# Patient Record
Sex: Female | Born: 1975
Health system: Southern US, Community
[De-identification: ages and names within clinical notes are randomized; demographics above are authoritative.]

## PROBLEM LIST (undated history)

## (undated) DIAGNOSIS — F419 Anxiety disorder, unspecified: Secondary | ICD-10-CM

## (undated) DIAGNOSIS — N920 Excessive and frequent menstruation with regular cycle: Secondary | ICD-10-CM

## (undated) DIAGNOSIS — R1909 Other intra-abdominal and pelvic swelling, mass and lump: Secondary | ICD-10-CM

## (undated) DIAGNOSIS — F32A Depression, unspecified: Secondary | ICD-10-CM

## (undated) DIAGNOSIS — D5 Iron 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): Secondary | ICD-10-CM

## (undated) DIAGNOSIS — I10 Essential (primary) hypertension: Secondary | ICD-10-CM

## (undated) DIAGNOSIS — M1990 Unspecified osteoarthritis, unspecified site: Secondary | ICD-10-CM

## (undated) DIAGNOSIS — E782 Mixed hyperlipidemia: Secondary | ICD-10-CM

## (undated) DIAGNOSIS — D649 Anemia, unspecified: Secondary | ICD-10-CM

## (undated) DIAGNOSIS — Z9289 Personal history of other medical treatment: Secondary | ICD-10-CM

---

## 2000-01-17 ENCOUNTER — Encounter: Admission: RE | Admit: 2000-01-17 | Discharge: 2000-01-17 | Payer: Self-pay | Admitting: Hematology and Oncology

## 2000-01-22 ENCOUNTER — Encounter: Admission: RE | Admit: 2000-01-22 | Discharge: 2000-01-22 | Payer: Self-pay | Admitting: Hematology and Oncology

## 2002-11-03 ENCOUNTER — Emergency Department (HOSPITAL_COMMUNITY): Admission: EM | Admit: 2002-11-03 | Discharge: 2002-11-03 | Payer: Self-pay

## 2011-10-24 DIAGNOSIS — E669 Obesity, unspecified: Secondary | ICD-10-CM | POA: Diagnosis not present

## 2011-10-24 DIAGNOSIS — Z309 Encounter for contraceptive management, unspecified: Secondary | ICD-10-CM | POA: Diagnosis not present

## 2011-10-24 DIAGNOSIS — I10 Essential (primary) hypertension: Secondary | ICD-10-CM | POA: Diagnosis not present

## 2011-10-24 DIAGNOSIS — F329 Major depressive disorder, single episode, unspecified: Secondary | ICD-10-CM | POA: Diagnosis not present

## 2012-02-05 DIAGNOSIS — I10 Essential (primary) hypertension: Secondary | ICD-10-CM | POA: Diagnosis not present

## 2012-02-05 DIAGNOSIS — F329 Major depressive disorder, single episode, unspecified: Secondary | ICD-10-CM | POA: Diagnosis not present

## 2013-02-11 DIAGNOSIS — Z13220 Encounter for screening for lipoid disorders: Secondary | ICD-10-CM | POA: Diagnosis not present

## 2013-02-11 DIAGNOSIS — Z309 Encounter for contraceptive management, unspecified: Secondary | ICD-10-CM | POA: Diagnosis not present

## 2013-02-11 DIAGNOSIS — I10 Essential (primary) hypertension: Secondary | ICD-10-CM | POA: Diagnosis not present

## 2013-02-11 DIAGNOSIS — Z131 Encounter for screening for diabetes mellitus: Secondary | ICD-10-CM | POA: Diagnosis not present

## 2013-06-22 DIAGNOSIS — I10 Essential (primary) hypertension: Secondary | ICD-10-CM | POA: Diagnosis not present

## 2013-06-22 DIAGNOSIS — Z23 Encounter for immunization: Secondary | ICD-10-CM | POA: Diagnosis not present

## 2013-06-22 DIAGNOSIS — Z309 Encounter for contraceptive management, unspecified: Secondary | ICD-10-CM | POA: Diagnosis not present

## 2013-06-22 DIAGNOSIS — F329 Major depressive disorder, single episode, unspecified: Secondary | ICD-10-CM | POA: Diagnosis not present

## 2014-06-22 DIAGNOSIS — Z1231 Encounter for screening mammogram for malignant neoplasm of breast: Secondary | ICD-10-CM | POA: Diagnosis not present

## 2014-06-22 DIAGNOSIS — Z13220 Encounter for screening for lipoid disorders: Secondary | ICD-10-CM | POA: Diagnosis not present

## 2014-06-22 DIAGNOSIS — Z131 Encounter for screening for diabetes mellitus: Secondary | ICD-10-CM | POA: Diagnosis not present

## 2014-06-22 DIAGNOSIS — I10 Essential (primary) hypertension: Secondary | ICD-10-CM | POA: Diagnosis not present

## 2014-06-22 DIAGNOSIS — F321 Major depressive disorder, single episode, moderate: Secondary | ICD-10-CM | POA: Diagnosis not present

## 2014-09-21 DIAGNOSIS — Z23 Encounter for immunization: Secondary | ICD-10-CM | POA: Diagnosis not present

## 2014-09-21 DIAGNOSIS — F321 Major depressive disorder, single episode, moderate: Secondary | ICD-10-CM | POA: Diagnosis not present

## 2014-09-21 DIAGNOSIS — Z111 Encounter for screening for respiratory tuberculosis: Secondary | ICD-10-CM | POA: Diagnosis not present

## 2014-09-21 DIAGNOSIS — Z1239 Encounter for other screening for malignant neoplasm of breast: Secondary | ICD-10-CM | POA: Diagnosis not present

## 2014-09-21 DIAGNOSIS — I10 Essential (primary) hypertension: Secondary | ICD-10-CM | POA: Diagnosis not present

## 2014-09-21 DIAGNOSIS — E669 Obesity, unspecified: Secondary | ICD-10-CM | POA: Diagnosis not present

## 2014-09-21 DIAGNOSIS — Z6833 Body mass index (BMI) 33.0-33.9, adult: Secondary | ICD-10-CM | POA: Diagnosis not present

## 2015-04-17 DIAGNOSIS — F321 Major depressive disorder, single episode, moderate: Secondary | ICD-10-CM | POA: Diagnosis not present

## 2015-04-17 DIAGNOSIS — I10 Essential (primary) hypertension: Secondary | ICD-10-CM | POA: Diagnosis not present

## 2015-04-17 DIAGNOSIS — Z13220 Encounter for screening for lipoid disorders: Secondary | ICD-10-CM | POA: Diagnosis not present

## 2015-04-17 DIAGNOSIS — Z1239 Encounter for other screening for malignant neoplasm of breast: Secondary | ICD-10-CM | POA: Diagnosis not present

## 2015-04-17 DIAGNOSIS — Z111 Encounter for screening for respiratory tuberculosis: Secondary | ICD-10-CM | POA: Diagnosis not present

## 2015-04-17 DIAGNOSIS — Z131 Encounter for screening for diabetes mellitus: Secondary | ICD-10-CM | POA: Diagnosis not present

## 2015-10-29 ENCOUNTER — Encounter (HOSPITAL_COMMUNITY): Payer: Self-pay

## 2015-10-29 ENCOUNTER — Emergency Department (HOSPITAL_COMMUNITY): Payer: Medicare Other

## 2015-10-29 ENCOUNTER — Emergency Department (HOSPITAL_COMMUNITY)
Admission: EM | Admit: 2015-10-29 | Discharge: 2015-10-29 | Disposition: A | Discharge status: Home / Self Care | Payer: Medicare Other | Attending: Emergency Medicine | Admitting: Emergency Medicine

## 2015-10-29 DIAGNOSIS — I10 Essential (primary) hypertension: Secondary | ICD-10-CM | POA: Insufficient documentation

## 2015-10-29 DIAGNOSIS — E876 Hypokalemia: Secondary | ICD-10-CM

## 2015-10-29 DIAGNOSIS — Z7982 Long term (current) use of aspirin: Secondary | ICD-10-CM | POA: Insufficient documentation

## 2015-10-29 DIAGNOSIS — Z79899 Other long term (current) drug therapy: Secondary | ICD-10-CM | POA: Insufficient documentation

## 2015-10-29 DIAGNOSIS — R0789 Other chest pain: Secondary | ICD-10-CM

## 2015-10-29 DIAGNOSIS — R079 Chest pain, unspecified: Secondary | ICD-10-CM | POA: Diagnosis not present

## 2015-10-29 HISTORY — DX: Personal history of other medical treatment: Z92.89

## 2015-10-29 HISTORY — DX: Essential (primary) hypertension: I10

## 2015-10-29 LAB — BASIC METABOLIC PANEL
Anion gap: 12 (ref 5–15)
BUN: 6 mg/dL (ref 6–20)
CO2: 25 mmol/L (ref 22–32)
Calcium: 10 mg/dL (ref 8.9–10.3)
Chloride: 101 mmol/L (ref 101–111)
Creatinine, Ser: 0.79 mg/dL (ref 0.44–1.00)
GFR calc Af Amer: >60 mL/min (ref >60)
GFR calc non Af Amer: >60 mL/min (ref >60)
Glucose, Bld: 113 mg/dL — ABNORMAL HIGH (ref 65–99)
Potassium: 3.2 mmol/L — ABNORMAL LOW (ref 3.5–5.1)
Sodium: 138 mmol/L (ref 135–145)

## 2015-10-29 LAB — CBC
HCT: 37.4 % (ref 36.0–46.0)
Hemoglobin: 12.5 g/dL (ref 12.0–15.0)
MCH: 30.1 pg (ref 26.0–34.0)
MCHC: 33.4 g/dL (ref 30.0–36.0)
MCV: 90.1 fL (ref 78.0–100.0)
Platelets: 454 10*3/uL — ABNORMAL HIGH (ref 150–400)
RBC: 4.15 MIL/uL (ref 3.87–5.11)
RDW: 12.9 % (ref 11.5–15.5)
WBC: 9.2 10*3/uL (ref 4.0–10.5)

## 2015-10-29 LAB — I-STAT TROPONIN, ED: Troponin i, poc: 0 ng/mL (ref 0.00–0.08)

## 2015-10-29 MED ORDER — IBUPROFEN 200 MG PO TABS
600.0000 mg | ORAL_TABLET | Freq: Once | ORAL | Status: AC
  Administered 2015-10-29: 600 mg via ORAL
  Filled 2015-10-29: qty 3

## 2015-10-29 MED ORDER — CYCLOBENZAPRINE HCL 10 MG PO TABS
5.0000 mg | ORAL_TABLET | Freq: Once | ORAL | Status: AC
  Administered 2015-10-29: 5 mg via ORAL
  Filled 2015-10-29: qty 1

## 2015-10-29 MED ORDER — NAPROXEN 500 MG PO TABS: ORAL_TABLET | ORAL | Status: DC

## 2015-10-29 MED ORDER — POTASSIUM CHLORIDE CRYS ER 20 MEQ PO TBCR: 20.0000 meq | EXTENDED_RELEASE_TABLET | Freq: Two times a day (BID) | ORAL | Status: DC

## 2015-10-29 MED ORDER — POTASSIUM CHLORIDE CRYS ER 20 MEQ PO TBCR
40.0000 meq | EXTENDED_RELEASE_TABLET | Freq: Once | ORAL | Status: AC
  Administered 2015-10-29: 40 meq via ORAL
  Filled 2015-10-29: qty 2

## 2015-10-29 MED ORDER — CYCLOBENZAPRINE HCL 5 MG PO TABS: 5.0000 mg | ORAL_TABLET | Freq: Three times a day (TID) | ORAL | Status: DC | PRN

## 2015-10-29 NOTE — Discharge Instructions (Signed)
Take the potassium pills until gone. Use ice and heat for comfort under chest. Take the medication as prescribed for your chest wall pain. Recheck if you get a fever, struggle to breathe, or your pain gets severe.   Hypokalemia Hypokalemia means that the amount of potassium in the blood is lower than normal.Potassium is a chemical, called an electrolyte, that helps regulate the amount of fluid in the body. It also stimulates muscle contraction and helps nerves function properly.Most of the body's potassium is inside of cells, and only a very small amount is in the blood. Because the amount in the blood is so small, minor changes can be life-threatening. CAUSES  Antibiotics.  Diarrhea or vomiting.  Using laxatives too much, which can cause diarrhea.  Chronic kidney disease.  Water pills (diuretics).  Eating disorders (bulimia).  Low magnesium level.  Sweating a lot. SIGNS AND SYMPTOMS  Weakness.  Constipation.  Fatigue.  Muscle cramps.  Mental confusion.  Skipped heartbeats or irregular heartbeat (palpitations).  Tingling or numbness. DIAGNOSIS  Your health care provider can diagnose hypokalemia with blood tests. In addition to checking your potassium level, your health care provider may also check other lab tests. TREATMENT Hypokalemia can be treated with potassium supplements taken by mouth or adjustments in your current medicines. If your potassium level is very low, you may need to get potassium through a vein (IV) and be monitored in the hospital. A diet high in potassium is also helpful. Foods high in potassium are:  Nuts, such as peanuts and pistachios.  Seeds, such as sunflower seeds and pumpkin seeds.  Peas, lentils, and lima beans.  Whole grain and bran cereals and breads.  Fresh fruit and vegetables, such as apricots, avocado, bananas, cantaloupe, kiwi, oranges, tomatoes, asparagus, and potatoes.  Orange and tomato juices.  Red meats.  Fruit  yogurt. HOME CARE INSTRUCTIONS  Take all medicines as prescribed by your health care provider.  Maintain a healthy diet by including nutritious food, such as fruits, vegetables, nuts, whole grains, and lean meats.  If you are taking a laxative, be sure to follow the directions on the label. SEEK MEDICAL CARE IF:  Your weakness gets worse.  You feel your heart pounding or racing.  You are vomiting or having diarrhea.  You are diabetic and having trouble keeping your blood glucose in the normal range. SEEK IMMEDIATE MEDICAL CARE IF:  You have chest pain, shortness of breath, or dizziness.  You are vomiting or having diarrhea for more than 2 days.  You faint. MAKE SURE YOU:   Understand these instructions.  Will watch your condition.  Will get help right away if you are not doing well or get worse.   This information is not intended to replace advice given to you by your health care provider. Make sure you discuss any questions you have with your health care provider.   Document Released: 08/19/2005 Document Revised: 09/09/2014 Document Reviewed: 02/19/2013 Elsevier Interactive Patient Education 2016 Elsevier Inc[employer]  Chest Wall Pain Chest wall pain is pain in or around the bones and muscles of your chest. Sometimes, an injury causes this pain. Sometimes, the cause may not be known. This pain may take several weeks or longer to get better. HOME CARE Pay attention to any changes in your symptoms. Take these actions to help with your pain:  Rest as told by your doctor.  Avoid activities that cause pain. Try not to use your chest, belly (abdominal), or side muscles to lift heavy things.  If directed, apply ice to the painful area:  Put ice in a plastic bag.  Place a towel between your skin and the bag.  Leave the ice on for 20 minutes, 2-3 times per day.  Take over-the-counter and prescription medicines only as told by your doctor.  Do not use tobacco products,  including cigarettes, chewing tobacco, and e-cigarettes. If you need help quitting, ask your doctor.  Keep all follow-up visits as told by your doctor. This is important. GET HELP IF:  You have a fever.  Your chest pain gets worse.  You have new symptoms. GET HELP RIGHT AWAY IF:  You feel sick to your stomach (nauseous) or you throw up (vomit).  You feel sweaty or light-headed.  You have a cough with phlegm (sputum) or you cough up blood.  You are short of breath.   This information is not intended to replace advice given to you by your health care provider. Make sure you discuss any questions you have with your health care provider.   Document Released: 02/05/2008 Document Revised: 05/10/2015 Document Reviewed: 11/14/2014 Elsevier Interactive Patient Education [employer].

## 2015-10-29 NOTE — ED Provider Notes (Signed)
CSN: [id number]     Arrival date & time 10/29/15  0137 History   First MD Initiated Contact with Patient 10/29/15 [phone]   Chief Complaint  Patient presents with  . Chest Pain     (Consider location/radiation/quality/duration/timing/severity/associated sxs/prior Treatment) HPI patient reports she started getting left upper chest pain a couple of days ago. She states it is a warm feeling. She denies it feeling sharp, dull, aching, or pressure. She states nothing she does makes it feel worse, nothing she does makes it feel better. She has tried no medications. She states the pain has been there constantly since it started and it does not radiate. She denies shortness of breath, diaphoresis, coughing, nausea, vomiting, or swelling of her legs. She states she's never had it before. She denies any change in her activity before the pain started.  PCP Dr Jeanie Cooks  Past Medical History  Diagnosis Date  . Hypertension   . History of blood transfusion    Past Surgical History  Procedure Laterality Date  . Cesarean section     No family history on file. Social History  Substance Use Topics  . Smoking status: Never Smoker   . Smokeless tobacco: None  . Alcohol Use: No   employed  OB History    No data available     Review of Systems  All other systems reviewed and are negative.     Allergies  Review of patient's allergies indicates no known allergies.  Home Medications   Prior to Admission medications   Medication Sig Start Date End Date Taking? Authorizing Provider  aspirin EC 81 MG tablet Take 81 mg by mouth daily.   Yes Historical Provider, MD  hydrochlorothiazide (HYDRODIURIL) 25 MG tablet Take 25 mg by mouth daily.   Yes Historical Provider, MD  cyclobenzaprine (FLEXERIL) 5 MG tablet Take 1 tablet (5 mg total) by mouth 3 (three) times daily as needed (muscle soreness). 10/29/15   Rolland Porter, MD  naproxen (NAPROSYN) 500 MG tablet Take 1 po BID with food prn pain 10/29/15   Rolland Porter, MD  potassium chloride SA (K-DUR,KLOR-CON) 20 MEQ tablet Take 1 tablet (20 mEq total) by mouth 2 (two) times daily. 10/29/15   Rolland Porter, MD   BP 119/91 mmHg  Pulse 81  Temp(Src) 97.8 F (36.6 C) (Oral)  Resp 19  Ht 5\' 3"  (1.6 m)  Wt 175 lb (79.379 kg)  BMI 31.01 kg/m2  SpO2 99%  LMP 10/10/2015  Vital signs normal   Physical Exam  Constitutional: She is oriented to person, place, and time. She appears well-developed and well-nourished.  Non-toxic appearance. She does not appear ill. No distress.  HENT:  Head: Normocephalic and atraumatic.  Right Ear: External ear normal.  Left Ear: External ear normal.  Nose: Nose normal. No mucosal edema or rhinorrhea.  Mouth/Throat: Oropharynx is clear and moist and mucous membranes are normal. No dental abscesses or uvula swelling.  Eyes: Conjunctivae and EOM are normal. Pupils are equal, round, and reactive to light.  Neck: Normal range of motion and full passive range of motion without pain. Neck supple.  Cardiovascular: Normal rate, regular rhythm and normal heart sounds.  Exam reveals no gallop and no friction rub.   No murmur heard. Pulmonary/Chest: Effort normal and breath sounds normal. No respiratory distress. She has no wheezes. She has no rhonchi. She has no rales. She exhibits tenderness. She exhibits no crepitus.    Patient has some tenderness in her left upper chest area.  Abdominal: Soft. Normal appearance and bowel sounds are normal. She exhibits no distension. There is no tenderness. There is no rebound and no guarding.  Musculoskeletal: Normal range of motion. She exhibits no edema or tenderness.  Moves all extremities well.   Neurological: She is alert and oriented to person, place, and time. She has normal strength. No cranial nerve deficit.  Skin: Skin is warm, dry and intact. No rash noted. No erythema. No pallor.  Psychiatric: She has a normal mood and affect. Her speech is normal and behavior is normal. Her mood  appears not anxious.  Nursing note and vitals reviewed.   ED Course  Procedures (including critical care time)  Medications  ibuprofen (ADVIL,MOTRIN) tablet 600 mg (600 mg Oral Given 10/29/15 0536)  cyclobenzaprine (FLEXERIL) tablet 5 mg (5 mg Oral Given 10/29/15 0536)  potassium chloride SA (K-DUR,KLOR-CON) CR tablet 40 mEq (40 mEq Oral Given 10/29/15 0536)    We discussed patient's test results which she has a mild low potassium most likely from her HCTZ. Patient was asked why she thought maybe her HCTZ was not working as noted in her triage sheet. She states she's been on it for a long time and she thought maybe that was causing her pain. She was given anti-inflammatory muscle relaxer for her pain.  Recheck 705 AM she states her  pain is improved. She will be discharged.   Labs Review Results for orders placed or performed during the hospital encounter of [id number]  Basic metabolic panel  Result Value Ref Range   Sodium 138 135 - 145 mmol/L   Potassium 3.2 (L) 3.5 - 5.1 mmol/L   Chloride 101 101 - 111 mmol/L   CO2 25 22 - 32 mmol/L   Glucose, Bld 113 (H) 65 - 99 mg/dL   BUN 6 6 - 20 mg/dL   Creatinine, Ser 0.79 0.44 - 1.00 mg/dL   Calcium 10.0 8.9 - 10.3 mg/dL   GFR calc non Af Amer >60 >60 mL/min   GFR calc Af Amer >60 >60 mL/min   Anion gap 12 5 - 15  CBC  Result Value Ref Range   WBC 9.2 4.0 - 10.5 K/uL   RBC 4.15 3.87 - 5.11 MIL/uL   Hemoglobin 12.5 12.0 - 15.0 g/dL   HCT 37.4 36.0 - 46.0 %   MCV 90.1 78.0 - 100.0 fL   MCH 30.1 26.0 - 34.0 pg   MCHC 33.4 30.0 - 36.0 g/dL   RDW 12.9 11.5 - 15.5 %   Platelets 454 (H) 150 - 400 K/uL  I-stat troponin, ED (not at [facility])  Result Value Ref Range   Troponin i, poc 0.00 0.00 - 0.08 ng/mL   Comment 3           Laboratory interpretation all normal except hypokalemia   Imaging Review Dg Chest 2 View  10/29/2015  CLINICAL DATA:  Chest pain on the left for 4 days. EXAM: CHEST  2 VIEW COMPARISON:  None. FINDINGS: Mild  elevation of right hemidiaphragm. The cardiomediastinal contours are normal. The lungs are clear. Pulmonary vasculature is normal. No consolidation, pleural effusion, or pneumothorax. No acute osseous abnormalities are seen. IMPRESSION: Mild elevation of right hemidiaphragm.  No acute process. Electronically Signed   By: Jeb Levering M.D.   On: 10/29/2015 02:15   I have personally reviewed and evaluated these images and lab results as part of my medical decision-making.   EKG Interpretation   Date/Time:  Sunday October 29 2015 01:50:38 EST  Ventricular Rate:  96 PR Interval:  136 QRS Duration: 68 QT Interval:  350 QTC Calculation: 442 R Axis:   -9 Text Interpretation:  Normal sinus rhythm Cannot rule out Anterior infarct  , age undetermined No old tracing to compare Confirmed by Cassandra Mcmanaman  MD-I, Iyana Topor  ([id number]) on 10/29/2015 4:23:58 AM      MDM   Final diagnoses:  Chest wall pain  Hypokalemia    New Prescriptions   CYCLOBENZAPRINE (FLEXERIL) 5 MG TABLET    Take 1 tablet (5 mg total) by mouth 3 (three) times daily as needed (muscle soreness).   NAPROXEN (NAPROSYN) 500 MG TABLET    Take 1 po BID with food prn pain   POTASSIUM CHLORIDE SA (K-DUR,KLOR-CON) 20 MEQ TABLET    Take 1 tablet (20 mEq total) by mouth 2 (two) times daily.    Plan discharge  Rolland Porter, MD, Barbette Or, MD 10/29/15 [phone]

## 2015-10-29 NOTE — ED Notes (Signed)
Pt has been having left sided cp for the past couple days. Denies any N/V or SOB. Thinks it could be related to her HCTZ not working.

## 2015-10-29 NOTE — ED Notes (Signed)
Pt verbalized understanding of d/c instructions, prescriptions, and follow-up care. No further questions/concerns, VSS, ambulatory w/ steady gait (refused wheelchair) 

## 2015-11-10 DIAGNOSIS — Z131 Encounter for screening for diabetes mellitus: Secondary | ICD-10-CM | POA: Diagnosis not present

## 2015-11-10 DIAGNOSIS — R071 Chest pain on breathing: Secondary | ICD-10-CM | POA: Diagnosis not present

## 2015-11-10 DIAGNOSIS — Z13220 Encounter for screening for lipoid disorders: Secondary | ICD-10-CM | POA: Diagnosis not present

## 2015-11-10 DIAGNOSIS — I10 Essential (primary) hypertension: Secondary | ICD-10-CM | POA: Diagnosis not present

## 2015-11-24 DIAGNOSIS — I10 Essential (primary) hypertension: Secondary | ICD-10-CM | POA: Diagnosis not present

## 2015-11-24 DIAGNOSIS — E669 Obesity, unspecified: Secondary | ICD-10-CM | POA: Diagnosis not present

## 2015-11-24 DIAGNOSIS — Z1239 Encounter for other screening for malignant neoplasm of breast: Secondary | ICD-10-CM | POA: Diagnosis not present

## 2015-11-24 DIAGNOSIS — F321 Major depressive disorder, single episode, moderate: Secondary | ICD-10-CM | POA: Diagnosis not present

## 2015-12-06 ENCOUNTER — Other Ambulatory Visit: Payer: Self-pay

## 2015-12-06 DIAGNOSIS — Z1231 Encounter for screening mammogram for malignant neoplasm of breast: Secondary | ICD-10-CM

## 2015-12-27 ENCOUNTER — Ambulatory Visit: Payer: Medicare Other

## 2016-01-01 ENCOUNTER — Ambulatory Visit
Admission: RE | Admit: 2016-01-01 | Discharge: 2016-01-01 | Disposition: A | Discharge status: Home / Self Care | Payer: Medicare Other | Source: Ambulatory Visit

## 2016-01-01 DIAGNOSIS — Z1231 Encounter for screening mammogram for malignant neoplasm of breast: Secondary | ICD-10-CM | POA: Diagnosis not present

## 2016-02-13 ENCOUNTER — Emergency Department (HOSPITAL_COMMUNITY)
Admission: EM | Admit: 2016-02-13 | Discharge: 2016-02-13 | Disposition: A | Discharge status: Home / Self Care | Payer: Medicare Other | Attending: Emergency Medicine | Admitting: Emergency Medicine

## 2016-02-13 ENCOUNTER — Encounter (HOSPITAL_COMMUNITY): Payer: Self-pay | Admitting: Nurse Practitioner

## 2016-02-13 DIAGNOSIS — L0291 Cutaneous abscess, unspecified: Secondary | ICD-10-CM

## 2016-02-13 DIAGNOSIS — Z7982 Long term (current) use of aspirin: Secondary | ICD-10-CM | POA: Insufficient documentation

## 2016-02-13 DIAGNOSIS — L02211 Cutaneous abscess of abdominal wall: Secondary | ICD-10-CM | POA: Diagnosis not present

## 2016-02-13 DIAGNOSIS — R1032 Left lower quadrant pain: Secondary | ICD-10-CM | POA: Diagnosis not present

## 2016-02-13 DIAGNOSIS — K469 Unspecified abdominal hernia without obstruction or gangrene: Secondary | ICD-10-CM | POA: Diagnosis not present

## 2016-02-13 DIAGNOSIS — Z79899 Other long term (current) drug therapy: Secondary | ICD-10-CM | POA: Insufficient documentation

## 2016-02-13 DIAGNOSIS — I10 Essential (primary) hypertension: Secondary | ICD-10-CM | POA: Insufficient documentation

## 2016-02-13 MED ORDER — SULFAMETHOXAZOLE-TRIMETHOPRIM 800-160 MG PO TABS: 1.0000 | ORAL_TABLET | Freq: Two times a day (BID) | ORAL | Status: AC

## 2016-02-13 MED ORDER — IBUPROFEN 400 MG PO TABS
800.0000 mg | ORAL_TABLET | Freq: Once | ORAL | Status: AC
  Administered 2016-02-13: 800 mg via ORAL
  Filled 2016-02-13: qty 2

## 2016-02-13 NOTE — ED Provider Notes (Signed)
CSN: [id number]     Arrival date & time 02/13/16  1658 History  By signing Michelle name below, I, Meriel Flavors, attest that this documentation has been prepared under the direction and in the presence of Jaquala Fuller PA.  Electronically Signed: Meriel Flavors, ED Scribe. 01/28/2016. 4:01 PM.   Chief Complaint  Patient presents with  . Skin Problem   The history is provided by the patient. No language interpreter was used.   HPI Comments: Michelle Bell is a 40 y.o. female who presents to the Emergency Department complaining of a painful abscess to her left lower abdomen that started one month ago. She states the area feels firm and has been intermittently draining blood and pus over the past month. She denies redness of the skin or warmth from the area. She has not tried any OTC abscess treatments. She complains of soreness at the site which is exacerbated by palpation. Pt denies any other abdominal pain. Denies fevers, chills, dizziness, syncope, nausea, vomiting, diarrhea or any other acute complaints. Denies history of abscesses. No recent antibiotics. She has a PCP appointment on 6/27.   Past Medical History  Diagnosis Date  . Hypertension   . History of blood transfusion    Past Surgical History  Procedure Laterality Date  . Cesarean section     History reviewed. No pertinent family history. Social History  Substance Use Topics  . Smoking status: Never Smoker   . Smokeless tobacco: None  . Alcohol Use: No   OB History    No data available     Review of Systems  Gastrointestinal: Negative for abdominal pain.  Skin: Positive for wound (Left lower abdomen).  All other systems reviewed and are negative.  Allergies  Review of patient's allergies indicates no known allergies.  Home Medications   Prior to Admission medications   Medication Sig Start Date End Date Taking? Authorizing Provider  aspirin EC 81 MG tablet Take 81 mg by mouth daily.    Historical Provider, MD   cyclobenzaprine (FLEXERIL) 5 MG tablet Take 1 tablet (5 mg total) by mouth 3 (three) times daily as needed (muscle soreness). 10/29/15   Rolland Porter, MD  hydrochlorothiazide (HYDRODIURIL) 25 MG tablet Take 25 mg by mouth daily.    Historical Provider, MD  naproxen (NAPROSYN) 500 MG tablet Take 1 po BID with food prn pain 10/29/15   Rolland Porter, MD  potassium chloride SA (K-DUR,KLOR-CON) 20 MEQ tablet Take 1 tablet (20 mEq total) by mouth 2 (two) times daily. 10/29/15   Rolland Porter, MD  sulfamethoxazole-trimethoprim (BACTRIM DS,SEPTRA DS) 800-160 MG tablet Take 1 tablet by mouth 2 (two) times daily. 02/13/16 02/20/16  Leilah Polimeni, PA-C   BP 126/88 mmHg  Pulse 109  Temp(Src) 98.2 F (36.8 C) (Oral)  Resp 18  SpO2 99% Physical Exam  Constitutional: She appears well-developed and well-nourished. No distress.  Nontoxic appearing  HENT:  Head: Normocephalic and atraumatic.  Right Ear: External ear normal.  Left Ear: External ear normal.  Eyes: Conjunctivae are normal. Right eye exhibits no discharge. Left eye exhibits no discharge. No scleral icterus.  Neck: Normal range of motion.  Cardiovascular: Normal rate.   Pulmonary/Chest: Effort normal.  Abdominal: Soft. She exhibits no distension. There is no tenderness.  Mild TTP directly over induration without tenderness of remaining abdomen  Musculoskeletal: Normal range of motion.  Moves all extremities spontaneously  Neurological: She is alert. Coordination normal.  Skin: Skin is warm and dry.  Area of induration approximately 4  cm x 3 cm noted in the left lower quadrant. Overlying skin appears darker than surrounding skin but no erythema or streaking. There is a sinus tract that drains blood and serous fluid when palpated. No palpable fluctuance. No warmth to touch. No obvious abscess  Psychiatric: She has a normal mood and affect. Her behavior is normal.  Nursing note and vitals reviewed.   ED Course  Procedures  DIAGNOSTIC STUDIES: Oxygen  Saturation is 99% on RA, normal by Michelle interpretation.  COORDINATION OF CARE: 6:14 PM- Discussed treatment plan with pt at bedside and pt agreed to plan.   Labs Review Labs Reviewed - No data to display  Imaging Review No results found. I have personally reviewed and evaluated these images and lab results as part of Michelle medical decision-making.   EKG Interpretation None      MDM   Final diagnoses:  Abscess   40 year old female presenting with induration and drainage from the left lower quadrant x one month. Afebrile. Mildly hypertensive in triage; rechecked in exam room and BP is 126/88. Area of induration without fluctuance or erythema noted to the left lower quadrant. This area is actively draining blood and there is a reported history of purulent drainage. The area does not appear amenable to incision and drainage as there is no definitive abscess. Possible abscess that has been smoldering and intermittently draining over past month; will discharge with Bactrim given length of symptoms and persistent induration. Patient has an appointment with her PCP for a visit in 2 weeks. Encouraged patient to reschedule the appointment so she can get in for a skin check in the next 4-5 days. I have also discussed reasons to return immediately to the emergency department. Patient states understanding.  I personally performed the services described in this documentation, which was scribed in Michelle presence. The recorded information has been reviewed and is accurate.    Josephina Gip, PA-C 02/13/16 1902  Davonna Belling, MD 02/13/16 2325

## 2016-02-13 NOTE — Discharge Instructions (Signed)

## 2016-02-13 NOTE — ED Notes (Signed)
Pt verbalized understanding of d/c instructions and has no further questions. Pt stable and NAD.  

## 2016-02-13 NOTE — ED Notes (Signed)
Pt c/o 1 month history painful abscess to L lower abd, oozing pus and blood. She is alert and breathing easily

## 2016-02-27 DIAGNOSIS — Z13220 Encounter for screening for lipoid disorders: Secondary | ICD-10-CM | POA: Diagnosis not present

## 2016-02-27 DIAGNOSIS — I10 Essential (primary) hypertension: Secondary | ICD-10-CM | POA: Diagnosis not present

## 2016-02-27 DIAGNOSIS — L663 Perifolliculitis capitis abscedens: Secondary | ICD-10-CM | POA: Diagnosis not present

## 2016-02-27 DIAGNOSIS — T63001A Toxic effect of unspecified snake venom, accidental (unintentional), initial encounter: Secondary | ICD-10-CM | POA: Diagnosis not present

## 2016-02-27 DIAGNOSIS — Z131 Encounter for screening for diabetes mellitus: Secondary | ICD-10-CM | POA: Diagnosis not present

## 2016-04-23 DIAGNOSIS — L663 Perifolliculitis capitis abscedens: Secondary | ICD-10-CM | POA: Diagnosis not present

## 2016-04-23 DIAGNOSIS — I10 Essential (primary) hypertension: Secondary | ICD-10-CM | POA: Diagnosis not present

## 2016-04-23 DIAGNOSIS — F321 Major depressive disorder, single episode, moderate: Secondary | ICD-10-CM | POA: Diagnosis not present

## 2016-06-06 DIAGNOSIS — M542 Cervicalgia: Secondary | ICD-10-CM | POA: Diagnosis not present

## 2016-06-06 DIAGNOSIS — I10 Essential (primary) hypertension: Secondary | ICD-10-CM | POA: Diagnosis not present

## 2016-07-17 DIAGNOSIS — L663 Perifolliculitis capitis abscedens: Secondary | ICD-10-CM | POA: Diagnosis not present

## 2016-07-17 DIAGNOSIS — I10 Essential (primary) hypertension: Secondary | ICD-10-CM | POA: Diagnosis not present

## 2016-08-14 DIAGNOSIS — D485 Neoplasm of uncertain behavior of skin: Secondary | ICD-10-CM | POA: Diagnosis not present

## 2016-10-01 ENCOUNTER — Encounter (HOSPITAL_BASED_OUTPATIENT_CLINIC_OR_DEPARTMENT_OTHER): Payer: Self-pay | Admitting: *Deleted

## 2016-10-07 ENCOUNTER — Ambulatory Visit (HOSPITAL_BASED_OUTPATIENT_CLINIC_OR_DEPARTMENT_OTHER): Admission: RE | Admit: 2016-10-07 | Payer: Medicare Other | Source: Ambulatory Visit | Admitting: Specialist

## 2016-10-07 HISTORY — DX: Other intra-abdominal and pelvic swelling, mass and lump: R19.09

## 2016-10-07 SURGERY — EXCISION MASS
Anesthesia: General | Laterality: Left

## 2016-10-17 DIAGNOSIS — Z0000 Encounter for general adult medical examination without abnormal findings: Secondary | ICD-10-CM | POA: Diagnosis not present

## 2016-10-17 DIAGNOSIS — Z01118 Encounter for examination of ears and hearing with other abnormal findings: Secondary | ICD-10-CM | POA: Diagnosis not present

## 2016-10-17 DIAGNOSIS — Z87891 Personal history of nicotine dependence: Secondary | ICD-10-CM | POA: Diagnosis not present

## 2016-10-17 DIAGNOSIS — H538 Other visual disturbances: Secondary | ICD-10-CM | POA: Diagnosis not present

## 2016-10-17 DIAGNOSIS — Z136 Encounter for screening for cardiovascular disorders: Secondary | ICD-10-CM | POA: Diagnosis not present

## 2016-10-17 DIAGNOSIS — Z5181 Encounter for therapeutic drug level monitoring: Secondary | ICD-10-CM | POA: Diagnosis not present

## 2016-10-17 DIAGNOSIS — Z23 Encounter for immunization: Secondary | ICD-10-CM | POA: Diagnosis not present

## 2016-10-17 DIAGNOSIS — Z131 Encounter for screening for diabetes mellitus: Secondary | ICD-10-CM | POA: Diagnosis not present

## 2016-10-17 DIAGNOSIS — I10 Essential (primary) hypertension: Secondary | ICD-10-CM | POA: Diagnosis not present

## 2016-10-25 ENCOUNTER — Other Ambulatory Visit: Payer: Self-pay | Admitting: Surgery

## 2016-10-25 DIAGNOSIS — R0600 Dyspnea, unspecified: Secondary | ICD-10-CM | POA: Diagnosis not present

## 2016-10-25 DIAGNOSIS — I10 Essential (primary) hypertension: Secondary | ICD-10-CM | POA: Diagnosis not present

## 2016-10-25 DIAGNOSIS — R1909 Other intra-abdominal and pelvic swelling, mass and lump: Secondary | ICD-10-CM | POA: Diagnosis not present

## 2016-10-29 ENCOUNTER — Other Ambulatory Visit: Payer: Self-pay | Admitting: Surgery

## 2016-10-29 DIAGNOSIS — R1909 Other intra-abdominal and pelvic swelling, mass and lump: Secondary | ICD-10-CM

## 2016-11-07 ENCOUNTER — Ambulatory Visit
Admission: RE | Admit: 2016-11-07 | Discharge: 2016-11-07 | Disposition: A | Discharge status: Home / Self Care | Payer: Medicare Other | Source: Ambulatory Visit | Attending: Surgery | Admitting: Surgery

## 2016-11-07 DIAGNOSIS — R1909 Other intra-abdominal and pelvic swelling, mass and lump: Secondary | ICD-10-CM

## 2016-11-07 DIAGNOSIS — R1900 Intra-abdominal and pelvic swelling, mass and lump, unspecified site: Secondary | ICD-10-CM | POA: Diagnosis not present

## 2016-11-07 MED ORDER — IOPAMIDOL (ISOVUE-300) INJECTION 61%
100.0000 mL | Freq: Once | INTRAVENOUS | Status: AC | PRN
  Administered 2016-11-07: 100 mL via INTRAVENOUS

## 2016-11-19 DIAGNOSIS — I10 Essential (primary) hypertension: Secondary | ICD-10-CM | POA: Diagnosis not present

## 2016-11-19 DIAGNOSIS — Z87891 Personal history of nicotine dependence: Secondary | ICD-10-CM | POA: Diagnosis not present

## 2016-11-19 DIAGNOSIS — E785 Hyperlipidemia, unspecified: Secondary | ICD-10-CM | POA: Diagnosis not present

## 2016-11-21 ENCOUNTER — Other Ambulatory Visit: Payer: Self-pay | Admitting: Surgery

## 2016-12-11 ENCOUNTER — Other Ambulatory Visit: Payer: Self-pay | Admitting: Physician Assistant

## 2016-12-11 DIAGNOSIS — Z1231 Encounter for screening mammogram for malignant neoplasm of breast: Secondary | ICD-10-CM

## 2016-12-13 ENCOUNTER — Encounter (HOSPITAL_COMMUNITY)
Admission: RE | Admit: 2016-12-13 | Discharge: 2016-12-13 | Disposition: A | Discharge status: Home / Self Care | Payer: Medicare Other | Source: Ambulatory Visit | Attending: Surgery | Admitting: Surgery

## 2016-12-13 ENCOUNTER — Encounter (HOSPITAL_COMMUNITY): Payer: Self-pay

## 2016-12-13 DIAGNOSIS — Z01810 Encounter for preprocedural cardiovascular examination: Secondary | ICD-10-CM | POA: Diagnosis not present

## 2016-12-13 DIAGNOSIS — Z01812 Encounter for preprocedural laboratory examination: Secondary | ICD-10-CM | POA: Diagnosis not present

## 2016-12-13 HISTORY — DX: Anemia, unspecified: D64.9

## 2016-12-13 LAB — CBC
HCT: 34.1 % — ABNORMAL LOW (ref 36.0–46.0)
Hemoglobin: 11.2 g/dL — ABNORMAL LOW (ref 12.0–15.0)
MCH: 29.3 pg (ref 26.0–34.0)
MCHC: 32.8 g/dL (ref 30.0–36.0)
MCV: 89.3 fL (ref 78.0–100.0)
Platelets: 497 10*3/uL — ABNORMAL HIGH (ref 150–400)
RBC: 3.82 MIL/uL — ABNORMAL LOW (ref 3.87–5.11)
RDW: 13.2 % (ref 11.5–15.5)
WBC: 8.4 10*3/uL (ref 4.0–10.5)

## 2016-12-13 LAB — BASIC METABOLIC PANEL
Anion gap: 11 (ref 5–15)
BUN: 6 mg/dL (ref 6–20)
CO2: 27 mmol/L (ref 22–32)
Calcium: 9.7 mg/dL (ref 8.9–10.3)
Chloride: 100 mmol/L — ABNORMAL LOW (ref 101–111)
Creatinine, Ser: 0.77 mg/dL (ref 0.44–1.00)
GFR calc Af Amer: >60 mL/min (ref >60)
GFR calc non Af Amer: >60 mL/min (ref >60)
Glucose, Bld: 97 mg/dL (ref 65–99)
Potassium: 3.5 mmol/L (ref 3.5–5.1)
Sodium: 138 mmol/L (ref 135–145)

## 2016-12-13 LAB — HCG, SERUM, QUALITATIVE: Preg, Serum: NEGATIVE

## 2016-12-13 NOTE — Pre-Procedure Instructions (Signed)
Michelle Bell  12/13/2016      [facility] - [place], [place] - [address] [place] [address] Phone: [phone] Fax: [phone]    Your procedure is scheduled on 12-17-2016  Tuesday .  Report to [facility] Admitting at 1:30 PM .  Call this number if you have problems the morning of surgery:  [phone]   Remember:  Do not eat food or drink liquids after midnight.   Take these medicines the morning of surgery with A SIP OF WATER Tylenol if needed   STOP ASPIRIN,ANTIINFLAMATORIES (IBUPROFEN,ALEVE,MOTRIN,ADVIL,GOODY'S POWDERS),HERBAL SUPPLEMENTS,FISH OIL,AND VITAMINS 5-7 DAYS PRIOR TO SURGERY     Do not wear jewelry, make-up or nail polish.  Do not wear lotions, powders, or perfumes, or deoderant.  Do not shave 48 hours prior to surgery.  Men may shave face and neck.   Do not bring valuables to the hospital.   [facility] is not responsible for any belongings or valuables.  Contacts, dentures or bridgework may not be worn into surgery.  Leave your suitcase in the car.  After surgery it may be brought to your room.  For patients admitted to the hospital, discharge time will be determined by your treatment team.  Patients discharged the day of surgery will not be allowed to drive home.    Special Instructions: [place] - Preparing for Surgery  Before surgery, you can play an important role.  Because skin is not sterile, your skin needs to be as free of germs as possible.  You can reduce the number of germs on you skin by washing with CHG (chlorahexidine gluconate) soap before surgery.  CHG is an antiseptic cleaner which kills germs and bonds with the skin to continue killing germs even after washing.  Please DO NOT use if you have an allergy to CHG or antibacterial soaps.  If your skin becomes reddened/irritated stop using the CHG and inform your nurse when you arrive at Short Stay.  Do not shave (including legs and  underarms) for at least 48 hours prior to the first CHG shower.  You may shave your face.  Please follow these instructions carefully:   1.  Shower with CHG Soap the night before surgery and the   morning of Surgery.  2.  If you choose to wash your hair, wash your hair first as usual with your normal shampoo.  3.  After you shampoo, rinse your hair and body thoroughly to remove the  Shampoo.  4.  Use CHG as you would any other liquid soap.  You can apply chg directly  to the skin and wash gently with scrungie or a clean washcloth.  5.  Apply the CHG Soap to your body ONLY FROM THE NECK DOWN.   Do not use on open wounds or open sores.  Avoid contact with your eyes,  ears, mouth and genitals (private parts).  Wash genitals (private parts) with your normal soap.  6.  Wash thoroughly, paying special attention to the area where your surgery will be performed.  7.  Thoroughly rinse your body with warm water from the neck down.  8.  DO NOT shower/wash with your normal soap after using and rinsing o  the CHG Soap.  9.  Pat yourself dry with a clean towel.            10.  Wear clean pajamas.            11.  Place clean sheets on your bed the night of your first shower and do not sleep with pets.  Day of Surgery  Do not apply any lotions/deodorants the morning of surgery.  Please wear clean clothes to the hospital/surgery center.   Please read over the following fact sheets that you were given. Pain Booklet, Coughing and Deep Breathing and Surgical Site Infection Prevention

## 2016-12-13 NOTE — Progress Notes (Signed)
PCP  Dr. Nolene Ebbs  EKG requested from Dr. Jeanie Cooks  Pt. Denies any cardiac testing

## 2016-12-17 NOTE — Progress Notes (Signed)
LVMM for patient to call to give her new date, time and place for surgery on 12/23/16.

## 2016-12-18 NOTE — Progress Notes (Signed)
Spoke with patient and she wanted me to leave information regarding location of surgery as well as date and time on answering machne which was done. Asked patient to call me back so I know she received message.

## 2016-12-22 NOTE — H&P (Signed)
Michelle Bell  Location: [facility] Surgery Patient #: [id number] DOB: [birth year] Single / Language: Michelle Bell / Race: Black or African American Female   History of Present Illness (Mica Releford A. Ninfa Linden MD;   The patient is a 41 year old female who presents with a complaint of Mass. This patient is referred by Dr. Iona Beard Osei-Bonsu for evaluation of a left groin mass. The patient reports that she has had it for more than 6 months. She has pain in the groin mass and the left side. She reports it'll get worse with her menstrual cycles. She was actually originally scheduled to have this excised by a plastic surgeon but then decided to cancel the surgery for fears of being put to sleep by general anesthesia. She has no previous trauma to the area. She has no other similar masses or lesions. She denies any drainage from the area. She is not currently on antibiotics.   Past Surgical History  No pertinent past surgical history   Allergies  No Known Allergies  No Known Drug Allergies   Medication History   HydroCHLOROthiazide (12.5MG  Capsule, Oral daily) Active. Potassium Chloride (20MEQ Tablet ER, Oral daily) Active. Aspirin (81MG  Capsule, Oral daily) Active. Medications Reconciled  Social History Caffeine use  Coffee. No alcohol use   Family History  First Degree Relatives  No pertinent family history   Pregnancy / Birth History   Maternal age  43-25 Regular periods   Other Problems No pertinent past medical history     Review of Systems  General Not Present- Appetite Loss, Chills, Fatigue, Fever, Night Sweats, Weight Gain and Weight Loss. Skin Not Present- Change in Wart/Mole, Dryness, Hives, Jaundice, New Lesions, Non-Healing Wounds, Rash and Ulcer. HEENT Not Present- Earache, Hearing Loss, Hoarseness, Nose Bleed, Oral Ulcers, Ringing in the Ears, Seasonal Allergies, Sinus Pain, Sore Throat, Visual Disturbances, Wears glasses/contact lenses and Yellow  Eyes. Respiratory Not Present- Bloody sputum, Chronic Cough, Difficulty Breathing, Snoring and Wheezing. Breast Present- Breast Mass. Not Present- Breast Pain, Nipple Discharge and Skin Changes. Cardiovascular Not Present- Chest Pain, Difficulty Breathing Lying Down, Leg Cramps, Palpitations, Rapid Heart Rate, Shortness of Breath and Swelling of Extremities. Gastrointestinal Not Present- Abdominal Pain, Bloating, Bloody Stool, Change in Bowel Habits, Chronic diarrhea, Constipation, Difficulty Swallowing, Excessive gas, Gets full quickly at meals, Hemorrhoids, Indigestion, Nausea, Rectal Pain and Vomiting. Female Genitourinary Not Present- Frequency, Nocturia, Painful Urination, Pelvic Pain and Urgency. Musculoskeletal Not Present- Back Pain, Joint Pain, Joint Stiffness, Muscle Pain, Muscle Weakness and Swelling of Extremities. Neurological Not Present- Decreased Memory, Fainting, Headaches, Numbness, Seizures, Tingling, Tremor, Trouble walking and Weakness. Psychiatric Not Present- Anxiety, Bipolar, Change in Sleep Pattern, Depression, Fearful and Frequent crying. Endocrine Not Present- Cold Intolerance, Excessive Hunger, Hair Changes, Heat Intolerance, Hot flashes and New Diabetes. Hematology Not Present- Blood Thinners, Easy Bruising, Excessive bleeding, Gland problems, HIV and Persistent Infections.  Vitals   Weight: 187.8 lb Height: 63in Body Surface Area: 1.88 m Body Mass Index: 33.27 kg/m  Pulse: 105 (Regular)  BP: 122/78 (Sitting, Left Arm, Standard)   Physical Exam  General Mental Status-Alert. General Appearance-Consistent with stated age. Hydration-Well hydrated. Voice-Normal.  Head and Neck Head-normocephalic, atraumatic with no lesions or palpable masses. Trachea-midline. Thyroid Gland Characteristics - normal size and consistency.  Eye Eyeball - Bilateral-Extraocular movements intact. Sclera/Conjunctiva - Bilateral-No scleral  icterus.  Chest and Lung Exam Chest and lung exam reveals -quiet, even and easy respiratory effort with no use of accessory muscles and on auscultation, normal breath sounds, no  adventitious sounds and normal vocal resonance. Inspection Chest Wall - Normal. Back - normal.  Breast - Did not examine.  Cardiovascular Cardiovascular examination reveals -normal heart sounds, regular rate and rhythm with no murmurs and normal pedal pulses bilaterally.  Abdomen Inspection Inspection of the abdomen reveals - No Hernias. Skin - Scar - no surgical scars. Palpation/Percussion Palpation and Percussion of the abdomen reveal - Soft, Non Tender, No Rebound tenderness, No Rigidity (guarding) and No hepatosplenomegaly. Auscultation Auscultation of the abdomen reveals - Bowel sounds normal. Note: There is a large 6-8 cm mass in her left inguinal area. It is visible on the scan but also goes deep. It feels fairly fixed. It is mildly tender. There is no fluctuation or erythema. It is at her old C-section incision.   Neurologic Neurologic evaluation reveals -alert and oriented x 3 with no impairment of recent or remote memory. Mental Status-Normal.  Musculoskeletal - Did not examine.  Lymphatic Head & Neck  General Head & Neck Lymphatics: Bilateral - Description - Normal. Axillary  General Axillary Region: Bilateral - Description - Normal. Tenderness - Non Tender. Femoral & Inguinal  Generalized Femoral & Inguinal Lymphatics: Bilateral - Description - Normal. Tenderness - Non Tender.    Assessment & Plan   LEFT GROIN MASS (R19.09) Impression: This is a very large mass in her groin. I'm uncertain of the etiology of the mass. Given her previous C-section and the discomfort she has with her menstrual cycles, this may represent a large endometrioma. Preoperatively, I believe she needs a CT scan of her abdomen and pelvis to evaluate the total extent and size of this mass as on physical  examination it is almost 8 cm. I explained this to her and her family in detail. This would require an extensive resection with a general anesthesia. I will call her back with the results of the CT scan.  Addendum: A CT scan was performed showing:  Left groin soft tissue mass with changes of central necrosis. Although this may represent an abscess the overall appearance suggests a soft tissue mass lesion and surgical excision is recommended for definitive diagnosis.  I again discussed this with her and we will proceed to the OR for excision of the left groin mass.  I discussed the risks which include but are not limited to bleeding, infection, injury to surrounding structures, need for drains, prolonged drainage, recurrence, the need for further surgery, DVT, post op recovery, etc.  She agrees to proceed.

## 2016-12-23 ENCOUNTER — Encounter (HOSPITAL_COMMUNITY)
Admission: RE | Disposition: A | Discharge status: Home / Self Care | Payer: Self-pay | Source: Ambulatory Visit | Attending: Surgery

## 2016-12-23 ENCOUNTER — Encounter (HOSPITAL_COMMUNITY): Payer: Self-pay | Admitting: *Deleted

## 2016-12-23 ENCOUNTER — Ambulatory Visit (HOSPITAL_COMMUNITY): Payer: Medicare Other | Admitting: Vascular Surgery

## 2016-12-23 ENCOUNTER — Ambulatory Visit (HOSPITAL_COMMUNITY)
Admission: RE | Admit: 2016-12-23 | Discharge: 2016-12-23 | Disposition: A | Discharge status: Home / Self Care | Payer: Medicare Other | Source: Ambulatory Visit | Attending: Surgery | Admitting: Surgery

## 2016-12-23 ENCOUNTER — Other Ambulatory Visit: Payer: Self-pay

## 2016-12-23 ENCOUNTER — Ambulatory Visit (HOSPITAL_COMMUNITY): Payer: Medicare Other | Admitting: Anesthesiology

## 2016-12-23 DIAGNOSIS — Z79899 Other long term (current) drug therapy: Secondary | ICD-10-CM | POA: Diagnosis not present

## 2016-12-23 DIAGNOSIS — N808 Other endometriosis: Secondary | ICD-10-CM | POA: Diagnosis not present

## 2016-12-23 DIAGNOSIS — N806 Endometriosis in cutaneous scar: Secondary | ICD-10-CM | POA: Diagnosis not present

## 2016-12-23 DIAGNOSIS — Z7982 Long term (current) use of aspirin: Secondary | ICD-10-CM | POA: Diagnosis not present

## 2016-12-23 DIAGNOSIS — R1909 Other intra-abdominal and pelvic swelling, mass and lump: Secondary | ICD-10-CM | POA: Diagnosis not present

## 2016-12-23 HISTORY — PX: MASS EXCISION: SHX2000

## 2016-12-23 SURGERY — EXCISION MASS
Anesthesia: General | Site: Groin | Laterality: Left

## 2016-12-23 MED ORDER — ONDANSETRON HCL 4 MG/2ML IJ SOLN
INTRAMUSCULAR | Status: DC | PRN
  Administered 2016-12-23: 4 mg via INTRAVENOUS

## 2016-12-23 MED ORDER — OXYCODONE HCL 5 MG PO TABS: 5.0000 mg | ORAL_TABLET | ORAL | Status: DC | PRN

## 2016-12-23 MED ORDER — KETOROLAC TROMETHAMINE 30 MG/ML IJ SOLN
INTRAMUSCULAR | Status: DC | PRN
  Administered 2016-12-23: 30 mg via INTRAVENOUS

## 2016-12-23 MED ORDER — MIDAZOLAM HCL 2 MG/2ML IJ SOLN
INTRAMUSCULAR | Status: AC
  Filled 2016-12-23: qty 2

## 2016-12-23 MED ORDER — PROPOFOL 10 MG/ML IV BOLUS
INTRAVENOUS | Status: AC
  Filled 2016-12-23: qty 20

## 2016-12-23 MED ORDER — PHENYLEPHRINE HCL 10 MG/ML IJ SOLN
INTRAMUSCULAR | Status: DC | PRN
  Administered 2016-12-23 (×4): 40 ug via INTRAVENOUS

## 2016-12-23 MED ORDER — ONDANSETRON HCL 4 MG/2ML IJ SOLN
INTRAMUSCULAR | Status: AC
  Filled 2016-12-23: qty 2

## 2016-12-23 MED ORDER — ROCURONIUM BROMIDE 50 MG/5ML IV SOSY
PREFILLED_SYRINGE | INTRAVENOUS | Status: DC | PRN
  Administered 2016-12-23: 10 mg via INTRAVENOUS

## 2016-12-23 MED ORDER — PROPOFOL 10 MG/ML IV BOLUS
INTRAVENOUS | Status: DC | PRN
  Administered 2016-12-23: 50 mg via INTRAVENOUS
  Administered 2016-12-23: 150 mg via INTRAVENOUS

## 2016-12-23 MED ORDER — 0.9 % SODIUM CHLORIDE (POUR BTL) OPTIME
TOPICAL | Status: DC | PRN
  Administered 2016-12-23: 1000 mL

## 2016-12-23 MED ORDER — MIDAZOLAM HCL 2 MG/2ML IJ SOLN
INTRAMUSCULAR | Status: DC | PRN
  Administered 2016-12-23: 2 mg via INTRAVENOUS

## 2016-12-23 MED ORDER — OXYCODONE HCL 5 MG PO TABS: 5.0000 mg | ORAL_TABLET | ORAL | 0 refills | Status: DC | PRN

## 2016-12-23 MED ORDER — ACETAMINOPHEN 650 MG RE SUPP
650.0000 mg | RECTAL | Status: DC | PRN
  Filled 2016-12-23: qty 1

## 2016-12-23 MED ORDER — CHLORHEXIDINE GLUCONATE CLOTH 2 % EX PADS
6.0000 | MEDICATED_PAD | Freq: Once | CUTANEOUS | Status: AC
  Administered 2016-12-23: 6 via TOPICAL

## 2016-12-23 MED ORDER — EPHEDRINE SULFATE 50 MG/ML IJ SOLN
INTRAMUSCULAR | Status: DC | PRN
  Administered 2016-12-23: 10 mg via INTRAVENOUS

## 2016-12-23 MED ORDER — ROCURONIUM BROMIDE 50 MG/5ML IV SOSY
PREFILLED_SYRINGE | INTRAVENOUS | Status: AC
  Filled 2016-12-23: qty 5

## 2016-12-23 MED ORDER — FENTANYL CITRATE (PF) 250 MCG/5ML IJ SOLN
INTRAMUSCULAR | Status: DC | PRN
  Administered 2016-12-23 (×2): 100 ug via INTRAVENOUS
  Administered 2016-12-23: 50 ug via INTRAVENOUS

## 2016-12-23 MED ORDER — SUGAMMADEX SODIUM 200 MG/2ML IV SOLN
INTRAVENOUS | Status: AC
  Filled 2016-12-23: qty 2

## 2016-12-23 MED ORDER — BUPIVACAINE HCL (PF) 0.25 % IJ SOLN
INTRAMUSCULAR | Status: AC
  Filled 2016-12-23: qty 30

## 2016-12-23 MED ORDER — SODIUM CHLORIDE 0.9% FLUSH: 3.0000 mL | INTRAVENOUS | Status: DC | PRN

## 2016-12-23 MED ORDER — SUCCINYLCHOLINE CHLORIDE 200 MG/10ML IV SOSY
PREFILLED_SYRINGE | INTRAVENOUS | Status: DC | PRN
  Administered 2016-12-23: 100 mg via INTRAVENOUS

## 2016-12-23 MED ORDER — SODIUM CHLORIDE 0.9% FLUSH: 3.0000 mL | Freq: Two times a day (BID) | INTRAVENOUS | Status: DC

## 2016-12-23 MED ORDER — SUGAMMADEX SODIUM 200 MG/2ML IV SOLN
INTRAVENOUS | Status: DC | PRN
  Administered 2016-12-23: 200 mg via INTRAVENOUS

## 2016-12-23 MED ORDER — DEXAMETHASONE SODIUM PHOSPHATE 10 MG/ML IJ SOLN
INTRAMUSCULAR | Status: DC | PRN
  Administered 2016-12-23: 10 mg via INTRAVENOUS

## 2016-12-23 MED ORDER — FENTANYL CITRATE (PF) 250 MCG/5ML IJ SOLN
INTRAMUSCULAR | Status: AC
  Filled 2016-12-23: qty 5

## 2016-12-23 MED ORDER — DEXAMETHASONE SODIUM PHOSPHATE 10 MG/ML IJ SOLN
INTRAMUSCULAR | Status: AC
  Filled 2016-12-23: qty 1

## 2016-12-23 MED ORDER — CEFAZOLIN SODIUM-DEXTROSE 2-4 GM/100ML-% IV SOLN
2.0000 g | INTRAVENOUS | Status: AC
  Administered 2016-12-23: 2 g via INTRAVENOUS
  Filled 2016-12-23: qty 100

## 2016-12-23 MED ORDER — EPHEDRINE 5 MG/ML INJ
INTRAVENOUS | Status: AC
  Filled 2016-12-23: qty 10

## 2016-12-23 MED ORDER — SODIUM CHLORIDE 0.9 % IV SOLN: 250.0000 mL | INTRAVENOUS | Status: DC | PRN

## 2016-12-23 MED ORDER — PROMETHAZINE HCL 25 MG/ML IJ SOLN
6.2500 mg | INTRAMUSCULAR | Status: DC | PRN
  Administered 2016-12-23: 6.25 mg via INTRAVENOUS
  Filled 2016-12-23: qty 1

## 2016-12-23 MED ORDER — HYDROMORPHONE HCL 1 MG/ML IJ SOLN: 0.2500 mg | INTRAMUSCULAR | Status: DC | PRN

## 2016-12-23 MED ORDER — MORPHINE SULFATE (PF) 4 MG/ML IV SOLN: 1.0000 mg | INTRAVENOUS | Status: DC | PRN

## 2016-12-23 MED ORDER — LIDOCAINE 2% (20 MG/ML) 5 ML SYRINGE
INTRAMUSCULAR | Status: DC | PRN
  Administered 2016-12-23: 80 mg via INTRAVENOUS

## 2016-12-23 MED ORDER — SUCCINYLCHOLINE CHLORIDE 200 MG/10ML IV SOSY
PREFILLED_SYRINGE | INTRAVENOUS | Status: AC
  Filled 2016-12-23: qty 10

## 2016-12-23 MED ORDER — MEPERIDINE HCL 50 MG/ML IJ SOLN: 6.2500 mg | INTRAMUSCULAR | Status: DC | PRN

## 2016-12-23 MED ORDER — LIDOCAINE 2% (20 MG/ML) 5 ML SYRINGE
INTRAMUSCULAR | Status: AC
  Filled 2016-12-23: qty 5

## 2016-12-23 MED ORDER — BUPIVACAINE HCL (PF) 0.25 % IJ SOLN
INTRAMUSCULAR | Status: DC | PRN
  Administered 2016-12-23: 30 mL

## 2016-12-23 MED ORDER — ACETAMINOPHEN 325 MG PO TABS: 650.0000 mg | ORAL_TABLET | ORAL | Status: DC | PRN

## 2016-12-23 MED ORDER — LACTATED RINGERS IV SOLN
INTRAVENOUS | Status: DC
  Administered 2016-12-23: 14:00:00 via INTRAVENOUS

## 2016-12-23 MED ORDER — LACTATED RINGERS IV SOLN: INTRAVENOUS | Status: DC

## 2016-12-23 MED ORDER — PHENYLEPHRINE 40 MCG/ML (10ML) SYRINGE FOR IV PUSH (FOR BLOOD PRESSURE SUPPORT)
PREFILLED_SYRINGE | INTRAVENOUS | Status: AC
  Filled 2016-12-23: qty 10

## 2016-12-23 SURGICAL SUPPLY — 35 items
BLADE HEX COATED 2.75 (ELECTRODE) IMPLANT
BLADE SURG 15 STRL LF DISP TIS (BLADE) ×1 IMPLANT
BLADE SURG 15 STRL SS (BLADE) ×1
BLADE SURG SZ10 CARB STEEL (BLADE) ×2 IMPLANT
CHLORAPREP W/TINT 26ML (MISCELLANEOUS) ×2 IMPLANT
DECANTER SPIKE VIAL GLASS SM (MISCELLANEOUS) IMPLANT
DERMABOND ADVANCED (GAUZE/BANDAGES/DRESSINGS) ×1
DERMABOND ADVANCED .7 DNX12 (GAUZE/BANDAGES/DRESSINGS) ×1 IMPLANT
DRAIN PENROSE 18X1/2 LTX STRL (DRAIN) IMPLANT
DRAPE LAPAROTOMY TRNSV 102X78 (DRAPE) ×2 IMPLANT
ELECT PENCIL ROCKER SW 15FT (MISCELLANEOUS) ×2 IMPLANT
ELECT REM PT RETURN 15FT ADLT (MISCELLANEOUS) ×2 IMPLANT
GAUZE SPONGE 4X4 12PLY STRL (GAUZE/BANDAGES/DRESSINGS) IMPLANT
GAUZE SPONGE 4X4 16PLY XRAY LF (GAUZE/BANDAGES/DRESSINGS) IMPLANT
GLOVE SURG SIGNA 7.5 PF LTX (GLOVE) ×2 IMPLANT
GOWN STRL REUS W/TWL LRG LVL3 (GOWN DISPOSABLE) ×2 IMPLANT
GOWN STRL REUS W/TWL XL LVL3 (GOWN DISPOSABLE) ×2 IMPLANT
KIT BASIN OR (CUSTOM PROCEDURE TRAY) ×2 IMPLANT
NEEDLE HYPO 22GX1.5 SAFETY (NEEDLE) IMPLANT
NEEDLE HYPO 25X1 1.5 SAFETY (NEEDLE) ×2 IMPLANT
NS IRRIG 1000ML POUR BTL (IV SOLUTION) ×2 IMPLANT
PACK BASIC VI WITH GOWN DISP (CUSTOM PROCEDURE TRAY) ×2 IMPLANT
SPONGE LAP 4X18 X RAY DECT (DISPOSABLE) ×2 IMPLANT
STRIP CLOSURE SKIN 1/2X4 (GAUZE/BANDAGES/DRESSINGS) IMPLANT
SUT MNCRL AB 4-0 PS2 18 (SUTURE) ×2 IMPLANT
SUT VIC AB 2-0 CT1 27 (SUTURE) ×2
SUT VIC AB 2-0 CT1 TAPERPNT 27 (SUTURE) ×2 IMPLANT
SUT VIC AB 3-0 54XBRD REEL (SUTURE) IMPLANT
SUT VIC AB 3-0 BRD 54 (SUTURE)
SUT VIC AB 3-0 SH 27 (SUTURE)
SUT VIC AB 3-0 SH 27XBRD (SUTURE) IMPLANT
SYR BULB IRRIGATION 50ML (SYRINGE) ×2 IMPLANT
SYR CONTROL 10ML LL (SYRINGE) ×2 IMPLANT
TOWEL OR 17X26 10 PK STRL BLUE (TOWEL DISPOSABLE) ×2 IMPLANT
YANKAUER SUCT BULB TIP 10FT TU (MISCELLANEOUS) ×2 IMPLANT

## 2016-12-23 NOTE — Anesthesia Procedure Notes (Signed)
Procedure Name: Intubation Date/Time: 12/23/2016 3:22 PM Performed by: Dione Booze Pre-anesthesia Checklist: Emergency Drugs available, Suction available, Patient being monitored and Patient identified Patient Re-evaluated:Patient Re-evaluated prior to inductionOxygen Delivery Method: Circle system utilized Preoxygenation: Pre-oxygenation with 100% oxygen Intubation Type: IV induction Laryngoscope Size: Mac and 4 Grade View: Grade I Tube type: Oral Tube size: 7.5 mm Number of attempts: 1 Airway Equipment and Method: Stylet Placement Confirmation: ETT inserted through vocal cords under direct vision,  positive ETCO2 and breath sounds checked- equal and bilateral Secured at: 21 cm Tube secured with: Tape Dental Injury: Teeth and Oropharynx as per pre-operative assessment  Comments: Intubated at Dr. Trevor Mace request.

## 2016-12-23 NOTE — Transfer of Care (Signed)
Immediate Anesthesia Transfer of Care Note  Patient: Michelle Bell  Procedure(s) Performed: Procedure(s): EXCISION LEFT GROIN MASS (Left)  Patient Location: PACU  Anesthesia Type:General  Level of Consciousness: awake and patient cooperative  Airway & Oxygen Therapy: Patient Spontanous Breathing and Patient connected to face mask oxygen  Post-op Assessment: Report given to RN and Post -op Vital signs reviewed and stable  Post vital signs: Reviewed and stable  Last Vitals:  Vitals:   12/23/16 1310 12/23/16 1620  BP: 127/87 (P) 118/80  Pulse: 86 (P) 99  Resp: 18 (P) 19  Temp: 36.4 C (P) 36.4 C    Last Pain:  Vitals:   12/23/16 1422  TempSrc:   PainSc: 0-No pain      Patients Stated Pain Goal: 3 (56/25/63 [id number])  Complications: No apparent anesthesia complications

## 2016-12-23 NOTE — Discharge Instructions (Signed)
General Anesthesia, Adult, Care After These instructions provide you with information about caring for yourself after your procedure. Your health care provider may also give you more specific instructions. Your treatment has been planned according to current medical practices, but problems sometimes occur. Call your health care provider if you have any problems or questions after your procedure. What can I expect after the procedure? After the procedure, it is common to have:  Vomiting.  A sore throat.  Mental slowness. It is common to feel:  Nauseous.  Cold or shivery.  Sleepy.  Tired.  Sore or achy, even in parts of your body where you did not have surgery. Follow these instructions at home: For at least 24 hours after the procedure:   Do not:  Participate in activities where you could fall or become injured.  Drive.  Use heavy machinery.  Drink alcohol.  Take sleeping pills or medicines that cause drowsiness.  Make important decisions or sign legal documents.  Take care of children on your own.  Rest. Eating and drinking   If you vomit, drink water, juice, or soup when you can drink without vomiting.  Drink enough fluid to keep your urine clear or pale yellow.  Make sure you have little or no nausea before eating solid foods.  Follow the diet recommended by your health care provider. General instructions   Have a responsible adult stay with you until you are awake and alert.  Return to your normal activities as told by your health care provider. Ask your health care provider what activities are safe for you.  Take over-the-counter and prescription medicines only as told by your health care provider.  If you smoke, do not smoke without supervision.  Keep all follow-up visits as told by your health care provider. This is important. Contact a health care provider if:  You continue to have nausea or vomiting at home, and medicines are not helpful.  You  cannot drink fluids or start eating again.  You cannot urinate after 8-12 hours.  You develop a skin rash.  You have fever.  You have increasing redness at the site of your procedure. Get help right away if:  You have difficulty breathing.  You have chest pain.  You have unexpected bleeding.  You feel that you are having a life-threatening or urgent problem. This information is not intended to replace advice given to you by your health care provider. Make sure you discuss any questions you have with your health care provider. Document Released: 11/25/2000 Document Revised: 01/22/2016 Document Reviewed: 08/03/2015 Elsevier Interactive Patient Education  2017 [place] to shower starting tomorrow  No vigorous activity for one week  Ice pack, Ibuprofen, Tylenol also for pain  Ok to shower starting tomorrow

## 2016-12-23 NOTE — Anesthesia Postprocedure Evaluation (Addendum)
Anesthesia Post Note  Patient: Arista Treadway  Procedure(s) Performed: Procedure(s) (LRB): EXCISION LEFT GROIN MASS (Left)  Patient location during evaluation: PACU Anesthesia Type: General Level of consciousness: awake and alert Pain management: pain level controlled Vital Signs Assessment: post-procedure vital signs reviewed and stable Respiratory status: spontaneous breathing, nonlabored ventilation, respiratory function stable and patient connected to nasal cannula oxygen Cardiovascular status: blood pressure returned to baseline and stable Postop Assessment: no signs of nausea or vomiting Anesthetic complications: no       Last Vitals:  Vitals:   12/23/16 1645 12/23/16 1657  BP: 106/77 119/90  Pulse: 82 78  Resp: 16   Temp: 36.5 C 36.9 C    Last Pain:  Vitals:   12/23/16 1630  TempSrc:   PainSc: Asleep                 Effie Berkshire

## 2016-12-23 NOTE — Interval H&P Note (Signed)
History and Physical Interval Note: no change in H and P  12/23/2016 2:52 PM  Michelle Bell  has presented today for surgery, with the diagnosis of Left groin mass  The various methods of treatment have been discussed with the patient and family. After consideration of risks, benefits and other options for treatment, the patient has consented to  Procedure(s): EXCISION LEFT GROIN MASS (Left) as a surgical intervention .  The patient's history has been reviewed, patient examined, no change in status, stable for surgery.  I have reviewed the patient's chart and labs.  Questions were answered to the patient's satisfaction.     [name]

## 2016-12-23 NOTE — Op Note (Signed)
EXCISION LEFT GROIN MASS  Procedure Note  Anielle Mehlman 12/23/2016   Pre-op Diagnosis: Left groin mass     Post-op Diagnosis: same  Procedure(s): EXCISION LEFT GROIN MASS (5 cm x 7 cm)  Surgeon(s): Coralie Keens, MD  Anesthesia: General  Staff:  Circulator: Barbee Shropshire, RN Scrub Person: Stevan Born  Estimated Blood Loss: Minimal               Specimens: sent to path  Indications: This is a 41 year old female presents with a at least 6 months history of a left groin mass. She reports that the mass will change sides and discomfort with her menstrual cycles. A CT scan of the abdomen and pelvis demonstrated the mass in the subjacent tissue of the left groin going down to the fascia. There was central necrosis. I suspect it may represent an endometrioma.  Findings: The patient found have a large mass in subjacent tissue measuring approximately 5 cm x 7 cm in size. It went down and abutted the fascia of the left lower quadrant abdominal wall.  Procedure: The patient was brought to the operating room and identified as the correct patient. She was put supine on the operating table and general anesthesia was induced. Her abdomen and groins were then prepped and draped in usual sterile fashion. The visible mass in the left lower quadrant was identified. It was along her old Pfannenstiel incision. I made a large elliptical incision with a scalpel encompassing the mass. There was a larger portion of the mass that was subcutaneous. I excised a large mass in its entirety going only down to the fascia. There was some dark fluid in the mass adjacent to the fascia. There was no purulence. It abutted the fascia but did not appear to infiltrate into the abdominal cavity. I shaved it off of the fascia with electrocautery. Once the mass was completely excised was sent to pathology for evaluation. I achieved hemostasis with cautery. I anesthetized the wound thoroughly with Marcaine. Hemostasis  appeared to be achieved. I then closed the septations tissue with interrupted 2-0 Vicryl sutures and closed the skin with a running 4-0 Monocryl suture. Skin glue was then applied. The patient tolerated procedure well. All the counts were correct at the end of the procedure. The patient was then extubated in the operating room and taken in a stable condition to the recovery room.          Isaah Furry A   Date: 12/23/2016  Time: 4:05 PM

## 2016-12-23 NOTE — Anesthesia Preprocedure Evaluation (Addendum)
Anesthesia Evaluation  Patient identified by MRN, date of birth, ID band Patient awake    Reviewed: Allergy & Precautions, NPO status , Patient's Chart, lab work & pertinent test results  Airway Mallampati: II  TM Distance: >3 FB Neck ROM: Full    Dental  (+) Teeth Intact, Dental Advisory Given   Pulmonary neg pulmonary ROS,    breath sounds clear to auscultation       Cardiovascular hypertension, Pt. on medications  Rhythm:Regular Rate:Normal     Neuro/Psych negative neurological ROS  negative psych ROS   GI/Hepatic negative GI ROS, Neg liver ROS,   Endo/Other  negative endocrine ROS  Renal/GU negative Renal ROS  negative genitourinary   Musculoskeletal negative musculoskeletal ROS (+)   Abdominal   Peds negative pediatric ROS (+)  Hematology negative hematology ROS (+)   Anesthesia Other Findings   Reproductive/Obstetrics negative OB ROS                            Lab Results  Component Value Date   WBC 8.4 12/13/2016   HGB 11.2 (L) 12/13/2016   HCT 34.1 (L) 12/13/2016   MCV 89.3 12/13/2016   PLT 497 (H) 12/13/2016   Lab Results  Component Value Date   CREATININE 0.77 12/13/2016   BUN 6 12/13/2016   NA 138 12/13/2016   K 3.5 12/13/2016   CL 100 (L) 12/13/2016   CO2 27 12/13/2016   No results found for: INR, PROTIME  12/2016 EKG: normal sinus rhythm.   Anesthesia Physical Anesthesia Plan  ASA: II  Anesthesia Plan: General   Post-op Pain Management:    Induction: Intravenous  Airway Management Planned: Oral ETT  Additional Equipment:   Intra-op Plan:   Post-operative Plan: Extubation in OR  Informed Consent: I have reviewed the patients History and Physical, chart, labs and discussed the procedure including the risks, benefits and alternatives for the proposed anesthesia with the patient or authorized representative who has indicated his/her understanding and  acceptance.   Dental advisory given  Plan Discussed with: CRNA  Anesthesia Plan Comments:         Anesthesia Quick Evaluation

## 2016-12-24 ENCOUNTER — Encounter (HOSPITAL_COMMUNITY): Payer: Self-pay | Admitting: Surgery

## 2017-01-31 NOTE — Addendum Note (Signed)
Addendum  created 01/31/17 1226 by Effie Berkshire, MD   Sign clinical note

## 2017-02-10 DIAGNOSIS — Z124 Encounter for screening for malignant neoplasm of cervix: Secondary | ICD-10-CM | POA: Diagnosis not present

## 2017-02-10 DIAGNOSIS — Z113 Encounter for screening for infections with a predominantly sexual mode of transmission: Secondary | ICD-10-CM | POA: Diagnosis not present

## 2017-02-10 DIAGNOSIS — I10 Essential (primary) hypertension: Secondary | ICD-10-CM | POA: Diagnosis not present

## 2017-02-10 DIAGNOSIS — Z87891 Personal history of nicotine dependence: Secondary | ICD-10-CM | POA: Diagnosis not present

## 2017-02-10 DIAGNOSIS — E785 Hyperlipidemia, unspecified: Secondary | ICD-10-CM | POA: Diagnosis not present

## 2017-05-28 DIAGNOSIS — Z87891 Personal history of nicotine dependence: Secondary | ICD-10-CM | POA: Diagnosis not present

## 2017-05-28 DIAGNOSIS — Z23 Encounter for immunization: Secondary | ICD-10-CM | POA: Diagnosis not present

## 2017-05-28 DIAGNOSIS — I10 Essential (primary) hypertension: Secondary | ICD-10-CM | POA: Diagnosis not present

## 2017-05-28 DIAGNOSIS — E785 Hyperlipidemia, unspecified: Secondary | ICD-10-CM | POA: Diagnosis not present

## 2017-08-20 DIAGNOSIS — I10 Essential (primary) hypertension: Secondary | ICD-10-CM | POA: Diagnosis not present

## 2017-08-20 DIAGNOSIS — Z87891 Personal history of nicotine dependence: Secondary | ICD-10-CM | POA: Diagnosis not present

## 2017-08-20 DIAGNOSIS — E785 Hyperlipidemia, unspecified: Secondary | ICD-10-CM | POA: Diagnosis not present

## 2017-10-21 DIAGNOSIS — Z87891 Personal history of nicotine dependence: Secondary | ICD-10-CM | POA: Diagnosis not present

## 2017-10-21 DIAGNOSIS — Z0000 Encounter for general adult medical examination without abnormal findings: Secondary | ICD-10-CM | POA: Diagnosis not present

## 2017-10-21 DIAGNOSIS — E785 Hyperlipidemia, unspecified: Secondary | ICD-10-CM | POA: Diagnosis not present

## 2017-10-21 DIAGNOSIS — I10 Essential (primary) hypertension: Secondary | ICD-10-CM | POA: Diagnosis not present

## 2018-02-18 DIAGNOSIS — Z0110 Encounter for examination of ears and hearing without abnormal findings: Secondary | ICD-10-CM | POA: Diagnosis not present

## 2018-02-18 DIAGNOSIS — Z136 Encounter for screening for cardiovascular disorders: Secondary | ICD-10-CM | POA: Diagnosis not present

## 2018-02-18 DIAGNOSIS — Z0100 Encounter for examination of eyes and vision without abnormal findings: Secondary | ICD-10-CM | POA: Diagnosis not present

## 2018-02-18 DIAGNOSIS — I10 Essential (primary) hypertension: Secondary | ICD-10-CM | POA: Diagnosis not present

## 2018-02-18 DIAGNOSIS — Z87891 Personal history of nicotine dependence: Secondary | ICD-10-CM | POA: Diagnosis not present

## 2018-02-18 DIAGNOSIS — H538 Other visual disturbances: Secondary | ICD-10-CM | POA: Diagnosis not present

## 2018-02-18 DIAGNOSIS — E785 Hyperlipidemia, unspecified: Secondary | ICD-10-CM | POA: Diagnosis not present

## 2018-02-18 DIAGNOSIS — Z131 Encounter for screening for diabetes mellitus: Secondary | ICD-10-CM | POA: Diagnosis not present

## 2018-05-20 DIAGNOSIS — Z87891 Personal history of nicotine dependence: Secondary | ICD-10-CM | POA: Diagnosis not present

## 2018-05-20 DIAGNOSIS — E785 Hyperlipidemia, unspecified: Secondary | ICD-10-CM | POA: Diagnosis not present

## 2018-05-20 DIAGNOSIS — I10 Essential (primary) hypertension: Secondary | ICD-10-CM | POA: Diagnosis not present

## 2018-05-27 DIAGNOSIS — Z23 Encounter for immunization: Secondary | ICD-10-CM | POA: Diagnosis not present

## 2018-05-27 DIAGNOSIS — Z124 Encounter for screening for malignant neoplasm of cervix: Secondary | ICD-10-CM | POA: Diagnosis not present

## 2018-05-27 DIAGNOSIS — I10 Essential (primary) hypertension: Secondary | ICD-10-CM | POA: Diagnosis not present

## 2018-05-27 DIAGNOSIS — Z87891 Personal history of nicotine dependence: Secondary | ICD-10-CM | POA: Diagnosis not present

## 2018-05-27 DIAGNOSIS — R875 Abnormal microbiological findings in specimens from female genital organs: Secondary | ICD-10-CM | POA: Diagnosis not present

## 2018-05-27 DIAGNOSIS — E785 Hyperlipidemia, unspecified: Secondary | ICD-10-CM | POA: Diagnosis not present

## 2018-09-15 DIAGNOSIS — I10 Essential (primary) hypertension: Secondary | ICD-10-CM | POA: Diagnosis not present

## 2018-09-15 DIAGNOSIS — E782 Mixed hyperlipidemia: Secondary | ICD-10-CM | POA: Diagnosis not present

## 2018-09-15 DIAGNOSIS — Z87891 Personal history of nicotine dependence: Secondary | ICD-10-CM | POA: Diagnosis not present

## 2018-09-29 ENCOUNTER — Telehealth: Payer: Self-pay | Admitting: Oncology

## 2018-09-29 NOTE — Telephone Encounter (Signed)
A new hem appt has been scheduled for the pt to see Dr. Alen Blew on 2/5 at 2pm. Pt aware to arrive 30 minutes early.

## 2018-10-06 DIAGNOSIS — N62 Hypertrophy of breast: Secondary | ICD-10-CM | POA: Diagnosis not present

## 2018-10-06 DIAGNOSIS — M25512 Pain in left shoulder: Secondary | ICD-10-CM | POA: Diagnosis not present

## 2018-10-06 DIAGNOSIS — M549 Dorsalgia, unspecified: Secondary | ICD-10-CM | POA: Diagnosis not present

## 2018-10-06 DIAGNOSIS — M25511 Pain in right shoulder: Secondary | ICD-10-CM | POA: Diagnosis not present

## 2018-10-06 DIAGNOSIS — M542 Cervicalgia: Secondary | ICD-10-CM | POA: Diagnosis not present

## 2018-10-07 ENCOUNTER — Telehealth: Payer: Self-pay | Admitting: Hematology and Oncology

## 2018-10-07 ENCOUNTER — Encounter: Payer: Medicare Other | Admitting: Oncology

## 2018-10-07 NOTE — Telephone Encounter (Signed)
Pt has been rescheduled to see Dr. Lindi Adie on 2/12 at 915am. Pt is aware to arrive 30 minutes early

## 2018-10-14 ENCOUNTER — Ambulatory Visit: Payer: Medicare Other | Admitting: Hematology and Oncology

## 2018-10-14 DIAGNOSIS — D509 Iron deficiency anemia, unspecified: Secondary | ICD-10-CM | POA: Insufficient documentation

## 2018-10-14 NOTE — Assessment & Plan Note (Signed)
Lab review 09/23/2018: Hemoglobin 8.1, MCV 75, RDW 15.9, platelets 719, serum iron 11, iron saturation 3%, ferritin 20, O03 212, folic acid > 20  Counseling: I discussed with the patient the process of iron absorption. I counseled extensively regarding the different causes of iron deficiency including blood loss and malabsorption. Patient has had upper endoscopies and colonoscopies and did not have any clear identified source of blood loss. It is possible that the patient may still have an occult source of bleeding. However malabsorption is also a possibility.  Recommendation: 2 doses of IV iron infusion with Feraheme  I discussed with the patient that potentially there may be a need for additional IV iron infusions if the iron levels were to remain low in the future. The frequency of need of IV iron would depend on many other factors including the rate of loss and by the degree of absorption.  Return to clinic in 3 months with recheck on iron studies and hemoglobin.

## 2018-10-15 ENCOUNTER — Telehealth: Payer: Self-pay | Admitting: Hematology and Oncology

## 2018-10-15 NOTE — Telephone Encounter (Signed)
Pt cld to reschedule appt to 3/12 at 1pm. Pt aware to arrive 30 minutes early.

## 2018-10-27 DIAGNOSIS — D5 Iron 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): Secondary | ICD-10-CM | POA: Diagnosis not present

## 2018-10-27 DIAGNOSIS — Z87891 Personal history of nicotine dependence: Secondary | ICD-10-CM | POA: Diagnosis not present

## 2018-10-27 DIAGNOSIS — I10 Essential (primary) hypertension: Secondary | ICD-10-CM | POA: Diagnosis not present

## 2018-10-27 DIAGNOSIS — N920 Excessive and frequent menstruation with regular cycle: Secondary | ICD-10-CM | POA: Diagnosis not present

## 2018-10-27 DIAGNOSIS — Z0001 Encounter for general adult medical examination with abnormal findings: Secondary | ICD-10-CM | POA: Diagnosis not present

## 2018-10-27 DIAGNOSIS — E782 Mixed hyperlipidemia: Secondary | ICD-10-CM | POA: Diagnosis not present

## 2018-10-27 DIAGNOSIS — Z131 Encounter for screening for diabetes mellitus: Secondary | ICD-10-CM | POA: Diagnosis not present

## 2018-11-10 ENCOUNTER — Other Ambulatory Visit: Payer: Self-pay | Admitting: Internal Medicine

## 2018-11-10 DIAGNOSIS — Z1231 Encounter for screening mammogram for malignant neoplasm of breast: Secondary | ICD-10-CM

## 2018-11-11 NOTE — Progress Notes (Signed)
[facility] CONSULT NOTE  Patient Care Team: Nolene Ebbs, MD as PCP - General (Internal Medicine)  CHIEF COMPLAINTS/PURPOSE OF CONSULTATION: Newly diagnosed severe iron deficiency anemia  HISTORY OF PRESENTING ILLNESS:  Michelle Bell 43 y.o. female is here because of recent diagnosis of iron deficiency anemia. Her most recent labs from 09/15/18 show: iron saturation 3%, folic acid >69, Hg 8.1, platelets 719. She presents to the clinic today with a family member. She denies fatigue, SOB, or dizziness. She reports occasionally heavy menstrual periods and denies any blood in her stool. She has been taking oral iron pills for two weeks. She denies any history of gastric bypass surgery or knowledge of anemia prior to her recent diagnosis. She will follow up with her PCP in June to rule out blood loss. She reviewed her medication list with me.    I reviewed her records extensively and collaborated the history with the patient.  MEDICAL HISTORY:  Past Medical History:  Diagnosis Date  . Anemia   . Groin mass    left  . History of blood transfusion   . Hypertension     SURGICAL HISTORY: Past Surgical History:  Procedure Laterality Date  . CESAREAN SECTION    . MASS EXCISION Left 12/23/2016   Procedure: EXCISION LEFT GROIN MASS;  Surgeon: Coralie Keens, MD;  Location: [facility];  Service: General;  Laterality: Left;    SOCIAL HISTORY: Social History   Socioeconomic History  . Marital status: Single    Spouse name: Not on file  . Number of children: Not on file  . Years of education: Not on file  . Highest education level: Not on file  Occupational History  . Not on file  Social Needs  . Financial resource strain: Not on file  . Food insecurity:    Worry: Not on file    Inability: Not on file  . Transportation needs:    Medical: Not on file    Non-medical: Not on file  Tobacco Use  . Smoking status: Never Smoker  . Smokeless tobacco: Never Used  Substance  and Sexual Activity  . Alcohol use: No  . Drug use: No  . Sexual activity: Yes    Birth control/protection: None  Lifestyle  . Physical activity:    Days per week: Not on file    Minutes per session: Not on file  . Stress: Not on file  Relationships  . Social connections:    Talks on phone: Not on file    Gets together: Not on file    Attends religious service: Not on file    Active member of club or organization: Not on file    Attends meetings of clubs or organizations: Not on file    Relationship status: Not on file  . Intimate partner violence:    Fear of current or ex partner: Not on file    Emotionally abused: Not on file    Physically abused: Not on file    Forced sexual activity: Not on file  Other Topics Concern  . Not on file  Social History Narrative  . Not on file    FAMILY HISTORY: No family history on file.  ALLERGIES:  has No Known Allergies.  MEDICATIONS:  Current Outpatient Medications  Medication Sig Dispense Refill  . acetaminophen (TYLENOL) 325 MG tablet Take 325 mg by mouth daily as needed for moderate pain.    Marland Kitchen aspirin EC 81 MG tablet Take 81 mg by mouth daily.    Marland Kitchen  hydrochlorothiazide (HYDRODIURIL) 12.5 MG tablet Take 12.5 mg by mouth daily.    Marland Kitchen ibuprofen (ADVIL,MOTRIN) 200 MG tablet Take 200 mg by mouth daily as needed for headache or moderate pain.    . Multiple Vitamin (MULTIVITAMIN WITH MINERALS) TABS tablet Take 1 tablet by mouth daily.    Marland Kitchen oxyCODONE (OXY IR/ROXICODONE) 5 MG immediate release tablet Take 1-2 tablets (5-10 mg total) by mouth every 4 (four) hours as needed for moderate pain or severe pain. 30 tablet 0   No current facility-administered medications for this visit.     REVIEW OF SYSTEMS:   Constitutional: Denies fevers, chills or abnormal night sweats Eyes: Denies blurriness of vision, double vision or watery eyes Ears, nose, mouth, throat, and face: Denies mucositis or sore throat Respiratory: Denies cough, dyspnea or  wheezes Cardiovascular: Denies palpitation, chest discomfort or lower extremity swelling Gastrointestinal:  Denies nausea, heartburn or change in bowel habits Skin: Denies abnormal skin rashes GYN: (+) occasionally heavy menstruation Lymphatics: Denies new lymphadenopathy or easy bruising Neurological:Denies numbness, tingling or new weaknesses Behavioral/Psych: Mood is stable, no new changes  Breast: Denies any palpable lumps or discharge All other systems were reviewed with the patient and are negative.  PHYSICAL EXAMINATION: ECOG PERFORMANCE STATUS: 1 - Symptomatic but completely ambulatory  Vitals:   11/12/18 1312  BP: 117/89  Pulse: 78  Resp: 17  Temp: 97.7 F (36.5 C)  SpO2: 100%   Filed Weights   11/12/18 1312  Weight: 183 lb 8 oz (83.2 kg)    GENERAL:alert, no distress and comfortable SKIN: skin color, texture, turgor are normal, no rashes or significant lesions EYES: normal, conjunctiva are pink and non-injected, sclera clear OROPHARYNX:no exudate, no erythema and lips, buccal mucosa, and tongue normal  NECK: supple, thyroid normal size, non-tender, without nodularity LYMPH:  no palpable lymphadenopathy in the cervical, axillary or inguinal LUNGS: clear to auscultation and percussion with normal breathing effort HEART: regular rate & rhythm and no murmurs and no lower extremity edema ABDOMEN:abdomen soft, non-tender and normal bowel sounds Musculoskeletal:no cyanosis of digits and no clubbing  PSYCH: alert & oriented x 3 with fluent speech NEURO: no focal motor/sensory deficits  LABORATORY DATA:  I have reviewed the data as listed Lab Results  Component Value Date   WBC 8.4 12/13/2016   HGB 11.2 (L) 12/13/2016   HCT 34.1 (L) 12/13/2016   MCV 89.3 12/13/2016   PLT 497 (H) 12/13/2016   Lab Results  Component Value Date   NA 138 12/13/2016   K 3.5 12/13/2016   CL 100 (L) 12/13/2016   CO2 27 12/13/2016    RADIOGRAPHIC STUDIES: I have personally  reviewed the radiological reports and agreed with the findings in the report.  ASSESSMENT AND PLAN:  Iron deficiency anemia Lab review: 09/23/2018: Hemoglobin 8.1, MCV 75, RDW 15.9, TIBC 439, iron saturation 3%, ferritin 20, Z36 644, folic acid greater than 20, platelets 719 Patient is not very symptomatic from this anemia with suggest that she has had anemia for an extended period of time.  The above data suggests that the patient has iron deficiency anemia. Because of iron deficiency: Most likely to be heavy menstrual cycle bleeding.  However other causes will need to be ruled out.  Recommendation: 1.  IV iron treatment 2.  GYN and GI evaluation  Return to clinic in 3 months with labs and follow-up.   All questions were answered. The patient knows to call the clinic with any problems, questions or concerns.   Lindi Adie,  Loleta Dicker, MD 11/13/2018   Julious Oka Dorshimer, am acting as scribe for Nicholas Lose, MD.  I have reviewed the above documentation for accuracy and completeness, and I agree with the above.

## 2018-11-12 ENCOUNTER — Inpatient Hospital Stay: Payer: Medicare Other | Attending: Oncology | Admitting: Hematology and Oncology

## 2018-11-12 ENCOUNTER — Other Ambulatory Visit: Payer: Self-pay

## 2018-11-12 ENCOUNTER — Telehealth: Payer: Self-pay | Admitting: Hematology and Oncology

## 2018-11-12 VITALS — BP 117/89 | HR 78 | Temp 97.7°F | Resp 17 | Ht 63.0 in | Wt 183.5 lb

## 2018-11-12 DIAGNOSIS — N920 Excessive and frequent menstruation with regular cycle: Secondary | ICD-10-CM | POA: Diagnosis not present

## 2018-11-12 DIAGNOSIS — D509 Iron deficiency anemia, unspecified: Secondary | ICD-10-CM

## 2018-11-12 NOTE — Telephone Encounter (Signed)
Gave avs and calendar ° °

## 2018-11-13 NOTE — Assessment & Plan Note (Signed)
Lab review: 09/23/2018: Hemoglobin 8.1, MCV 75, RDW 15.9, TIBC 439, iron saturation 3%, ferritin 20, G88 110, folic acid greater than 20, platelets 719 Patient is not very symptomatic from this anemia with suggest that she has had anemia for an extended period of time.  The above data suggests that the patient has iron deficiency anemia. Because of iron deficiency: Most likely to be heavy menstrual cycle bleeding.  However other causes will need to be ruled out.  Recommendation: 1.  IV iron treatment 2.  GYN and GI evaluation  Return to clinic in 3 months with labs and follow-up.

## 2018-11-16 ENCOUNTER — Inpatient Hospital Stay: Payer: Medicare Other

## 2018-11-17 ENCOUNTER — Inpatient Hospital Stay: Payer: Medicare Other

## 2018-11-17 ENCOUNTER — Other Ambulatory Visit: Payer: Self-pay

## 2018-11-17 VITALS — BP 115/79 | HR 84 | Temp 97.8°F | Resp 16

## 2018-11-17 DIAGNOSIS — D509 Iron deficiency anemia, unspecified: Secondary | ICD-10-CM

## 2018-11-17 MED ORDER — SODIUM CHLORIDE 0.9 % IV SOLN
510.0000 mg | Freq: Once | INTRAVENOUS | Status: AC
  Administered 2018-11-17: 510 mg via INTRAVENOUS
  Filled 2018-11-17: qty 17

## 2018-11-17 MED ORDER — SODIUM CHLORIDE 0.9 % IV SOLN
Freq: Once | INTRAVENOUS | Status: AC
  Administered 2018-11-17: 10:00:00 via INTRAVENOUS
  Filled 2018-11-17: qty 250

## 2018-11-17 NOTE — Patient Instructions (Signed)

## 2018-11-23 ENCOUNTER — Other Ambulatory Visit: Payer: Self-pay

## 2018-11-23 ENCOUNTER — Telehealth: Payer: Self-pay | Admitting: Hematology and Oncology

## 2018-11-23 ENCOUNTER — Inpatient Hospital Stay: Payer: Medicare Other

## 2018-11-23 NOTE — Telephone Encounter (Signed)
Spoke with patient re 3/24 appointment.

## 2018-11-24 ENCOUNTER — Other Ambulatory Visit: Payer: Self-pay

## 2018-11-24 ENCOUNTER — Inpatient Hospital Stay: Payer: Medicare Other

## 2018-11-24 VITALS — BP 109/79 | HR 80 | Temp 98.0°F | Resp 16

## 2018-11-24 DIAGNOSIS — D509 Iron deficiency anemia, unspecified: Secondary | ICD-10-CM | POA: Diagnosis not present

## 2018-11-24 MED ORDER — SODIUM CHLORIDE 0.9 % IV SOLN
Freq: Once | INTRAVENOUS | Status: AC
  Administered 2018-11-24: 15:00:00 via INTRAVENOUS
  Filled 2018-11-24: qty 250

## 2018-11-24 MED ORDER — SODIUM CHLORIDE 0.9 % IV SOLN
510.0000 mg | Freq: Once | INTRAVENOUS | Status: AC
  Administered 2018-11-24: 510 mg via INTRAVENOUS
  Filled 2018-11-24: qty 17

## 2018-11-24 NOTE — Patient Instructions (Signed)
Coronavirus (COVID-19) Are you at risk?  Are you at risk for the Coronavirus (COVID-19)?  To be considered HIGH RISK for Coronavirus (COVID-19), you have to meet the following criteria:  . Traveled to [place], [place], [place], [place] or [place]; or in the [place] to [place], [place], [place], or [place]; and have fever, cough, and shortness of breath within the last 2 weeks of travel OR . Been in close contact with a person diagnosed with COVID-19 within the last 2 weeks and have fever, cough, and shortness of breath . IF YOU DO NOT MEET THESE CRITERIA, YOU ARE CONSIDERED LOW RISK FOR COVID-19.  What to do if you are HIGH RISK for COVID-19?  . If you are having a medical emergency, call 911. . Seek medical care right away. Before you go to a doctor's office, urgent care or emergency department, call ahead and tell them about your recent travel, contact with someone diagnosed with COVID-19, and your symptoms. You should receive instructions from your physician's office regarding next steps of care.  . When you arrive at healthcare provider, tell the healthcare staff immediately you have returned from visiting [place], [place], [place], [place] or [place]; or traveled in the [place] to [place], [place], [place], or [place]; in the last two weeks or you have been in close contact with a person diagnosed with COVID-19 in the last 2 weeks.   . Tell the health care staff about your symptoms: fever, cough and shortness of breath. . After you have been seen by a medical provider, you will be either: o Tested for (COVID-19) and discharged home on quarantine except to seek medical care if symptoms worsen, and asked to  - Stay home and avoid contact with others until you get your results (4-5 days)  - Avoid travel on public transportation if possible (such as bus, train, or airplane) or o Sent to the Emergency Department by EMS for evaluation, COVID-19 testing, and possible  admission depending on your condition and test results.  What to do if you are LOW RISK for COVID-19?  Reduce your risk of any infection by using the same precautions used for avoiding the common cold or flu:  . Wash your hands often with soap and warm water for at least 20 seconds.  If soap and water are not readily available, use an alcohol-based hand sanitizer with at least 60% alcohol.  . If coughing or sneezing, cover your mouth and nose by coughing or sneezing into the elbow areas of your shirt or coat, into a tissue or into your sleeve (not your hands). . Avoid shaking hands with others and consider head nods or verbal greetings only. . Avoid touching your eyes, nose, or mouth with unwashed hands.  . Avoid close contact with people who are sick. . Avoid places or events with large numbers of people in one location, like concerts or sporting events. . Carefully consider travel plans you have or are making. . If you are planning any travel outside or inside the [place], visit the CDC's Travelers' Health webpage for the latest health notices. . If you have some symptoms but not all symptoms, continue to monitor at home and seek medical attention if your symptoms worsen. . If you are having a medical emergency, call 911.   ADDITIONAL HEALTHCARE OPTIONS FOR PATIENTS  [facility] / e-Visit: [email or web address]         [facility]: [phone]  [place]   Urgent Care: [facility]: [phone]    Ferumoxytol injection (Feraheme) What is this medicine? FERUMOXYTOL is an iron complex. Iron is used to make healthy red blood cells, which carry oxygen and nutrients throughout the body. This medicine is used to treat iron deficiency anemia. This medicine may be used for other purposes; ask your health care provider or pharmacist if you have questions. COMMON BRAND NAME(S):  Feraheme What should I tell my health care provider before I take this medicine? They need to know if you have any of these conditions: -anemia not caused by low iron levels -high levels of iron in the blood -magnetic resonance imaging (MRI) test scheduled -an unusual or allergic reaction to iron, other medicines, foods, dyes, or preservatives -pregnant or trying to get pregnant -breast-feeding How should I use this medicine? This medicine is for injection into a vein. It is given by a health care professional in a hospital or clinic setting. Talk to your pediatrician regarding the use of this medicine in children. Special care may be needed. Overdosage: If you think you have taken too much of this medicine contact a poison control center or emergency room at once. NOTE: This medicine is only for you. Do not share this medicine with others. What if I miss a dose? It is important not to miss your dose. Call your doctor or health care professional if you are unable to keep an appointment. What may interact with this medicine? This medicine may interact with the following medications: -other iron products This list may not describe all possible interactions. Give your health care provider a list of all the medicines, herbs, non-prescription drugs, or dietary supplements you use. Also tell them if you smoke, drink alcohol, or use illegal drugs. Some items may interact with your medicine. What should I watch for while using this medicine? Visit your doctor or healthcare professional regularly. Tell your doctor or healthcare professional if your symptoms do not start to get better or if they get worse. You may need blood work done while you are taking this medicine. You may need to follow a special diet. Talk to your doctor. Foods that contain iron include: whole grains/cereals, dried fruits, beans, or peas, leafy green vegetables, and organ meats (liver, kidney). What side effects may I notice from  receiving this medicine? Side effects that you should report to your doctor or health care professional as soon as possible: -allergic reactions like skin rash, itching or hives, swelling of the face, lips, or tongue -breathing problems -changes in blood pressure -feeling faint or lightheaded, falls -fever or chills -flushing, sweating, or hot feelings -swelling of the ankles or feet Side effects that usually do not require medical attention (report to your doctor or health care professional if they continue or are bothersome): -diarrhea -headache -nausea, vomiting -stomach pain This list may not describe all possible side effects. Call your doctor for medical advice about side effects. You may report side effects to FDA at 1-800-FDA-1088. Where should I keep my medicine? This drug is given in a hospital or clinic and will not be stored at home. NOTE: This sheet is a summary. It may not cover all possible information. If you have questions about this medicine, talk to your doctor, pharmacist, or health care provider.  2019 Elsevier/Gold Standard (2016-10-07 20:21:10)

## 2019-01-22 ENCOUNTER — Ambulatory Visit: Payer: Medicare Other

## 2019-02-08 ENCOUNTER — Telehealth: Payer: Self-pay | Admitting: Hematology and Oncology

## 2019-02-08 NOTE — Assessment & Plan Note (Signed)
Lab review: 09/23/2018: Hemoglobin 8.1, MCV 75, RDW 15.9, TIBC 439, iron saturation 3%, ferritin 20, B84 730, folic acid greater than 20, platelets 719 Patient is not very symptomatic from this anemia with suggest that she has had anemia for an extended period of time. Etiology: Heavy menstrual cycle bleeding. Prior treatment: IV iron 11/17/2018  Lab review: Recommendation:

## 2019-02-08 NOTE — Telephone Encounter (Signed)
I left a message regarding video visit  °

## 2019-02-10 ENCOUNTER — Ambulatory Visit: Payer: Medicare Other

## 2019-02-11 ENCOUNTER — Inpatient Hospital Stay: Payer: Medicare Other | Attending: Oncology

## 2019-02-11 ENCOUNTER — Other Ambulatory Visit: Payer: Self-pay

## 2019-02-11 DIAGNOSIS — D509 Iron deficiency anemia, unspecified: Secondary | ICD-10-CM | POA: Diagnosis not present

## 2019-02-11 LAB — CBC WITH DIFFERENTIAL (CANCER CENTER ONLY)
Abs Immature Granulocytes: 0.02 10*3/uL (ref 0.00–0.07)
Basophils Absolute: 0.1 10*3/uL (ref 0.0–0.1)
Basophils Relative: 1 %
Eosinophils Absolute: 0.2 10*3/uL (ref 0.0–0.5)
Eosinophils Relative: 3 %
HCT: 37.3 % (ref 36.0–46.0)
Hemoglobin: 11.8 g/dL — ABNORMAL LOW (ref 12.0–15.0)
Immature Granulocytes: 0 %
Lymphocytes Relative: 36 %
Lymphs Abs: 2.7 10*3/uL (ref 0.7–4.0)
MCH: 30.2 pg (ref 26.0–34.0)
MCHC: 31.6 g/dL (ref 30.0–36.0)
MCV: 95.4 fL (ref 80.0–100.0)
Monocytes Absolute: 0.5 10*3/uL (ref 0.1–1.0)
Monocytes Relative: 7 %
Neutro Abs: 3.9 10*3/uL (ref 1.7–7.7)
Neutrophils Relative %: 53 %
Platelet Count: 517 10*3/uL — ABNORMAL HIGH (ref 150–400)
RBC: 3.91 MIL/uL (ref 3.87–5.11)
RDW: 13.5 % (ref 11.5–15.5)
WBC Count: 7.4 10*3/uL (ref 4.0–10.5)
nRBC: 0 % (ref 0.0–0.2)

## 2019-02-11 LAB — IRON AND TIBC
Iron: 48 ug/dL (ref 41–142)
Saturation Ratios: 16 % — ABNORMAL LOW (ref 21–57)
TIBC: 303 ug/dL (ref 236–444)
UIBC: 255 ug/dL (ref 120–384)

## 2019-02-11 LAB — FERRITIN: Ferritin: 119 ng/mL (ref 11–307)

## 2019-02-12 ENCOUNTER — Telehealth: Payer: Self-pay | Admitting: Hematology and Oncology

## 2019-02-12 NOTE — Telephone Encounter (Signed)
I left a message regarding phone visit per [facility] patient wanted to know if MD could contact her a different way for visit.

## 2019-02-12 NOTE — Telephone Encounter (Signed)
Called and verified appt and info.

## 2019-02-13 NOTE — Progress Notes (Signed)
  HEMATOLOGY-ONCOLOGY TELEPHONE VISIT PROGRESS NOTE  I connected with [name] on 02/15/2019 at  2:00 PM EDT by telephone and verified that I am speaking with the correct person using two identifiers.  I discussed the limitations, risks, security and privacy concerns of performing an evaluation and management service by telephone and the availability of in person appointments.  I also discussed with the patient that there may be a patient responsible charge related to this service. The patient expressed understanding and agreed to proceed.   History of Present Illness: Michelle Bell is a 43 y.o. female with above-mentioned history of severe iron deficiency anemia treated with IV iron. Her most recent labs on 02/11/19 showed Hg 11.8, HCT 37.3, platelets 517,000, iron saturation 16%, ferritin 119. She presents over the phone today for 59-month follow-up to review labs.   Observations/Objective:  Feeling well.   Assessment Plan:  Iron deficiency anemia Lab review: 09/23/2018: Hemoglobin 8.1, MCV 75, RDW 15.9, TIBC 439, iron saturation 3%, ferritin 20, A54 098, folic acid greater than 20, platelets 719 Patient is not very symptomatic from this anemia with suggest that she has had anemia for an extended period of time. Etiology: Heavy menstrual cycle bleeding. This has improved Prior treatment: IV iron 11/17/2018  Lab review: Hb improved from 8.1 to 11.8 gm Iron sat 16%, Ferritin: 119  Recommendation: 3 month follow up with labs done ahead of time  I discussed the assessment and treatment plan with the patient. The patient was provided an opportunity to ask questions and all were answered. The patient agreed with the plan and demonstrated an understanding of the instructions. The patient was advised to call back or seek an in-person evaluation if the symptoms worsen or if the condition fails to improve as anticipated.   I provided 15 minutes of non-face-to-face time during this encounter.    Rulon Eisenmenger, MD 02/15/2019    I, Molly Dorshimer, am acting as scribe for Nicholas Lose, MD.  I have reviewed the above documentation for accuracy and completeness, and I agree with the above.

## 2019-02-15 ENCOUNTER — Inpatient Hospital Stay (HOSPITAL_BASED_OUTPATIENT_CLINIC_OR_DEPARTMENT_OTHER): Payer: Medicare Other | Admitting: Hematology and Oncology

## 2019-02-15 DIAGNOSIS — D509 Iron deficiency anemia, unspecified: Secondary | ICD-10-CM

## 2019-02-15 DIAGNOSIS — E782 Mixed hyperlipidemia: Secondary | ICD-10-CM | POA: Diagnosis not present

## 2019-02-15 DIAGNOSIS — D5 Iron 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): Secondary | ICD-10-CM | POA: Diagnosis not present

## 2019-02-15 DIAGNOSIS — I10 Essential (primary) hypertension: Secondary | ICD-10-CM | POA: Diagnosis not present

## 2019-02-15 DIAGNOSIS — Z87891 Personal history of nicotine dependence: Secondary | ICD-10-CM | POA: Diagnosis not present

## 2019-02-15 DIAGNOSIS — M79641 Pain in right hand: Secondary | ICD-10-CM | POA: Diagnosis not present

## 2019-02-15 DIAGNOSIS — N920 Excessive and frequent menstruation with regular cycle: Secondary | ICD-10-CM | POA: Diagnosis not present

## 2019-02-16 ENCOUNTER — Telehealth: Payer: Self-pay | Admitting: Hematology and Oncology

## 2019-02-16 NOTE — Telephone Encounter (Signed)
I talk with patient regarding schedule  

## 2019-02-19 ENCOUNTER — Other Ambulatory Visit: Payer: Self-pay

## 2019-02-19 ENCOUNTER — Ambulatory Visit
Admission: RE | Admit: 2019-02-19 | Discharge: 2019-02-19 | Disposition: A | Discharge status: Home / Self Care | Payer: Medicare Other | Source: Ambulatory Visit | Attending: Internal Medicine | Admitting: Internal Medicine

## 2019-02-19 DIAGNOSIS — Z1231 Encounter for screening mammogram for malignant neoplasm of breast: Secondary | ICD-10-CM

## 2019-02-23 ENCOUNTER — Other Ambulatory Visit: Payer: Self-pay | Admitting: Internal Medicine

## 2019-02-23 DIAGNOSIS — R928 Other abnormal and inconclusive findings on diagnostic imaging of breast: Secondary | ICD-10-CM

## 2019-03-01 DIAGNOSIS — N920 Excessive and frequent menstruation with regular cycle: Secondary | ICD-10-CM | POA: Diagnosis not present

## 2019-03-01 DIAGNOSIS — I10 Essential (primary) hypertension: Secondary | ICD-10-CM | POA: Diagnosis not present

## 2019-03-01 DIAGNOSIS — D5 Iron 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): Secondary | ICD-10-CM | POA: Diagnosis not present

## 2019-03-01 DIAGNOSIS — E782 Mixed hyperlipidemia: Secondary | ICD-10-CM | POA: Diagnosis not present

## 2019-03-01 DIAGNOSIS — Z87891 Personal history of nicotine dependence: Secondary | ICD-10-CM | POA: Diagnosis not present

## 2019-03-02 ENCOUNTER — Ambulatory Visit: Payer: Medicare Other

## 2019-03-02 ENCOUNTER — Other Ambulatory Visit: Payer: Self-pay

## 2019-03-02 ENCOUNTER — Ambulatory Visit
Admission: RE | Admit: 2019-03-02 | Discharge: 2019-03-02 | Disposition: A | Discharge status: Home / Self Care | Payer: Medicare Other | Source: Ambulatory Visit | Attending: Internal Medicine | Admitting: Internal Medicine

## 2019-03-02 DIAGNOSIS — R928 Other abnormal and inconclusive findings on diagnostic imaging of breast: Secondary | ICD-10-CM

## 2019-05-18 ENCOUNTER — Other Ambulatory Visit: Payer: Self-pay

## 2019-05-18 ENCOUNTER — Inpatient Hospital Stay: Payer: Medicare Other | Attending: Hematology and Oncology

## 2019-05-18 DIAGNOSIS — D509 Iron deficiency anemia, unspecified: Secondary | ICD-10-CM | POA: Diagnosis not present

## 2019-05-18 LAB — CBC WITH DIFFERENTIAL (CANCER CENTER ONLY)
Abs Immature Granulocytes: 0.01 10*3/uL (ref 0.00–0.07)
Basophils Absolute: 0.1 10*3/uL (ref 0.0–0.1)
Basophils Relative: 1 %
Eosinophils Absolute: 0.2 10*3/uL (ref 0.0–0.5)
Eosinophils Relative: 4 %
HCT: 36.2 % (ref 36.0–46.0)
Hemoglobin: 11.9 g/dL — ABNORMAL LOW (ref 12.0–15.0)
Immature Granulocytes: 0 %
Lymphocytes Relative: 46 %
Lymphs Abs: 2.7 10*3/uL (ref 0.7–4.0)
MCH: 30.5 pg (ref 26.0–34.0)
MCHC: 32.9 g/dL (ref 30.0–36.0)
MCV: 92.8 fL (ref 80.0–100.0)
Monocytes Absolute: 0.5 10*3/uL (ref 0.1–1.0)
Monocytes Relative: 9 %
Neutro Abs: 2.3 10*3/uL (ref 1.7–7.7)
Neutrophils Relative %: 40 %
Platelet Count: 431 10*3/uL — ABNORMAL HIGH (ref 150–400)
RBC: 3.9 MIL/uL (ref 3.87–5.11)
RDW: 12.8 % (ref 11.5–15.5)
WBC Count: 5.8 10*3/uL (ref 4.0–10.5)
nRBC: 0 % (ref 0.0–0.2)

## 2019-05-18 LAB — IRON AND TIBC
Iron: 105 ug/dL (ref 41–142)
Saturation Ratios: 34 % (ref 21–57)
TIBC: 309 ug/dL (ref 236–444)
UIBC: 204 ug/dL (ref 120–384)

## 2019-05-18 LAB — FERRITIN: Ferritin: 110 ng/mL (ref 11–307)

## 2019-05-19 ENCOUNTER — Telehealth: Payer: Self-pay | Admitting: Hematology and Oncology

## 2019-05-19 NOTE — Progress Notes (Signed)
HEMATOLOGY-ONCOLOGY DOXIMITY VISIT PROGRESS NOTE  I connected with Michelle Bell on 05/20/2019 at  8:15 AM EDT by Doximity video conference and verified that I am speaking with the correct person using two identifiers.  I discussed the limitations, risks, security and privacy concerns of performing an evaluation and management service by Doximity and the availability of in person appointments.  I also discussed with the patient that there may be a patient responsible charge related to this service. The patient expressed understanding and agreed to proceed.  Patient's Location: Home Physician Location: Clinic  CHIEF COMPLIANT: Follow-up of severe iron-deficiency anemia  INTERVAL HISTORY: Michelle Bell is a 43 y.o. female with above-mentioned history of severe iron deficiency anemia treated with IV iron (last 11/24/18). Her most recent labs on 05/18/19 showed Hg 11.9, HCT 36.2, platelets 431,000, iron saturation 34%, ferritin 110. She presents over Doximity today for 58-month follow-up to review labs.  REVIEW OF SYSTEMS:   Constitutional: Denies fevers, chills or abnormal weight loss Eyes: Denies blurriness of vision Ears, nose, mouth, throat, and face: Denies mucositis or sore throat Respiratory: Denies cough, dyspnea or wheezes Cardiovascular: Denies palpitation, chest discomfort Gastrointestinal:  Denies nausea, heartburn or change in bowel habits Skin: Denies abnormal skin rashes Lymphatics: Denies new lymphadenopathy or easy bruising Neurological:Denies numbness, tingling or new weaknesses Behavioral/Psych: Mood is stable, no new changes  Extremities: No lower extremity edema Breast: denies any pain or lumps or nodules in either breasts All other systems were reviewed with the patient and are negative.  Observations/Objective:  There were no vitals filed for this visit. There is no height or weight on file to calculate BMI.  I have reviewed the data as listed CMP Latest  Ref Rng & Units 12/13/2016 10/29/2015  Glucose 65 - 99 mg/dL 97 113(H)  BUN 6 - 20 mg/dL 6 6  Creatinine 0.44 - 1.00 mg/dL 0.77 0.79  Sodium 135 - 145 mmol/L 138 138  Potassium 3.5 - 5.1 mmol/L 3.5 3.2(L)  Chloride 101 - 111 mmol/L 100(L) 101  CO2 22 - 32 mmol/L 27 25  Calcium 8.9 - 10.3 mg/dL 9.7 10.0    Lab Results  Component Value Date   WBC 5.8 05/18/2019   HGB 11.9 (L) 05/18/2019   HCT 36.2 05/18/2019   MCV 92.8 05/18/2019   PLT 431 (H) 05/18/2019   NEUTROABS 2.3 05/18/2019      Assessment Plan:  Iron deficiency anemia Lab review: 09/23/2018: Hemoglobin 8.1, MCV 75, RDW 15.9, TIBC 439, iron saturation 3%, ferritin 20, [id number], folic acid greater than 20, platelets 719 Etiology: Heavy menstrual cycle bleeding. This has improved Prior treatment: IV iron 11/17/2018  Lab review: 05/18/2019: Iron saturation 34%, TIBC 309, hemoglobin 11.9, MCV 92.8, platelet count 431  Recommendation: 6 month follow up with labs done ahead of time    I discussed the assessment and treatment plan with the patient. The patient was provided an opportunity to ask questions and all were answered. The patient agreed with the plan and demonstrated an understanding of the instructions. The patient was advised to call back or seek an in-person evaluation if the symptoms worsen or if the condition fails to improve as anticipated.   I provided 12 minutes of face-to-face Doximity time during this encounter.    Rulon Eisenmenger, MD 05/20/2019   I, Molly Dorshimer, am acting as scribe for Nicholas Lose, MD.  I have reviewed the above documentation for accuracy and completeness, and I agree with the above.

## 2019-05-19 NOTE — Telephone Encounter (Signed)
Left voicemail to confirm appt and verify info. °

## 2019-05-20 ENCOUNTER — Inpatient Hospital Stay (HOSPITAL_BASED_OUTPATIENT_CLINIC_OR_DEPARTMENT_OTHER): Payer: Medicare Other | Admitting: Hematology and Oncology

## 2019-05-20 DIAGNOSIS — D509 Iron deficiency anemia, unspecified: Secondary | ICD-10-CM

## 2019-05-20 NOTE — Assessment & Plan Note (Signed)
Lab review: 09/23/2018: Hemoglobin 8.1, MCV 75, RDW 15.9, TIBC 439, iron saturation 3%, ferritin 20, [id number], folic acid greater than 20, platelets 719 Etiology: Heavy menstrual cycle bleeding. This has improved Prior treatment: IV iron 11/17/2018  Lab review: 05/18/2019: Iron saturation 34%, TIBC 309, hemoglobin 11.9, MCV 92.8, platelet count 431  Recommendation: 6 month follow up with labs done ahead of time

## 2019-05-21 ENCOUNTER — Telehealth: Payer: Self-pay | Admitting: Hematology and Oncology

## 2019-05-21 NOTE — Telephone Encounter (Signed)
I talk with patient regarding schedule  

## 2019-05-26 DIAGNOSIS — Z23 Encounter for immunization: Secondary | ICD-10-CM | POA: Diagnosis not present

## 2019-09-06 DIAGNOSIS — R202 Paresthesia of skin: Secondary | ICD-10-CM | POA: Diagnosis not present

## 2019-09-06 DIAGNOSIS — D5 Iron 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): Secondary | ICD-10-CM | POA: Diagnosis not present

## 2019-09-06 DIAGNOSIS — N920 Excessive and frequent menstruation with regular cycle: Secondary | ICD-10-CM | POA: Diagnosis not present

## 2019-09-06 DIAGNOSIS — E782 Mixed hyperlipidemia: Secondary | ICD-10-CM | POA: Diagnosis not present

## 2019-09-06 DIAGNOSIS — Z87891 Personal history of nicotine dependence: Secondary | ICD-10-CM | POA: Diagnosis not present

## 2019-09-06 DIAGNOSIS — I10 Essential (primary) hypertension: Secondary | ICD-10-CM | POA: Diagnosis not present

## 2019-09-14 DIAGNOSIS — F330 Major depressive disorder, recurrent, mild: Secondary | ICD-10-CM | POA: Diagnosis not present

## 2019-09-23 ENCOUNTER — Ambulatory Visit: Payer: Medicare Other | Admitting: Podiatry

## 2019-09-30 ENCOUNTER — Ambulatory Visit: Payer: Medicare Other | Admitting: Podiatry

## 2019-10-18 ENCOUNTER — Ambulatory Visit: Payer: Medicare Other | Admitting: Podiatry

## 2019-10-28 DIAGNOSIS — D5 Iron 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): Secondary | ICD-10-CM | POA: Diagnosis not present

## 2019-10-28 DIAGNOSIS — N920 Excessive and frequent menstruation with regular cycle: Secondary | ICD-10-CM | POA: Diagnosis not present

## 2019-10-28 DIAGNOSIS — R202 Paresthesia of skin: Secondary | ICD-10-CM | POA: Diagnosis not present

## 2019-10-28 DIAGNOSIS — I10 Essential (primary) hypertension: Secondary | ICD-10-CM | POA: Diagnosis not present

## 2019-10-28 DIAGNOSIS — E782 Mixed hyperlipidemia: Secondary | ICD-10-CM | POA: Diagnosis not present

## 2019-10-28 DIAGNOSIS — Z87891 Personal history of nicotine dependence: Secondary | ICD-10-CM | POA: Diagnosis not present

## 2019-11-15 ENCOUNTER — Other Ambulatory Visit: Payer: Self-pay | Admitting: *Deleted

## 2019-11-15 DIAGNOSIS — D509 Iron deficiency anemia, unspecified: Secondary | ICD-10-CM

## 2019-11-16 ENCOUNTER — Inpatient Hospital Stay: Payer: Medicare Other | Attending: Hematology and Oncology

## 2019-11-17 NOTE — Progress Notes (Signed)
Patient had a telephone visit today.  I called her but there was no response. She needs to have blood work done prior to the visit anyway. I left her a message to call us back to schedule a lab appointment so that we can review her iron studies and CBC. This encounter was created in error - please disregard.

## 2019-11-18 ENCOUNTER — Inpatient Hospital Stay: Payer: Medicare Other | Admitting: Hematology and Oncology

## 2019-11-18 DIAGNOSIS — D509 Iron deficiency anemia, unspecified: Secondary | ICD-10-CM

## 2019-11-18 NOTE — Assessment & Plan Note (Signed)
Lab review: 09/23/2018: Hemoglobin 8.1, MCV 75, RDW 15.9, TIBC 439, iron saturation 3%, ferritin 20, [id number], folic acid greater than 20, platelets 719 Etiology: Heavy menstrual cycle bleeding.This has improved Prior treatment: IV iron 11/17/2018  Lab review:  05/18/2019: Iron saturation 34%, TIBC 309, hemoglobin 11.9, MCV 92.8, platelet count 431  Recommendation:

## 2019-12-09 DIAGNOSIS — E782 Mixed hyperlipidemia: Secondary | ICD-10-CM | POA: Diagnosis not present

## 2019-12-09 DIAGNOSIS — R062 Wheezing: Secondary | ICD-10-CM | POA: Diagnosis not present

## 2019-12-09 DIAGNOSIS — R771 Abnormality of globulin: Secondary | ICD-10-CM | POA: Diagnosis not present

## 2019-12-09 DIAGNOSIS — Z87891 Personal history of nicotine dependence: Secondary | ICD-10-CM | POA: Diagnosis not present

## 2019-12-09 DIAGNOSIS — Z0001 Encounter for general adult medical examination with abnormal findings: Secondary | ICD-10-CM | POA: Diagnosis not present

## 2019-12-09 DIAGNOSIS — E8809 Other disorders of plasma-protein metabolism, not elsewhere classified: Secondary | ICD-10-CM | POA: Diagnosis not present

## 2019-12-09 DIAGNOSIS — R202 Paresthesia of skin: Secondary | ICD-10-CM | POA: Diagnosis not present

## 2019-12-09 DIAGNOSIS — N920 Excessive and frequent menstruation with regular cycle: Secondary | ICD-10-CM | POA: Diagnosis not present

## 2019-12-09 DIAGNOSIS — D5 Iron 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): Secondary | ICD-10-CM | POA: Diagnosis not present

## 2019-12-09 DIAGNOSIS — I10 Essential (primary) hypertension: Secondary | ICD-10-CM | POA: Diagnosis not present

## 2019-12-10 ENCOUNTER — Other Ambulatory Visit: Payer: Self-pay | Admitting: Internal Medicine

## 2019-12-10 DIAGNOSIS — Z1231 Encounter for screening mammogram for malignant neoplasm of breast: Secondary | ICD-10-CM

## 2019-12-13 ENCOUNTER — Encounter: Payer: Self-pay | Admitting: *Deleted

## 2019-12-31 DIAGNOSIS — E8809 Other disorders of plasma-protein metabolism, not elsewhere classified: Secondary | ICD-10-CM | POA: Diagnosis not present

## 2019-12-31 DIAGNOSIS — E782 Mixed hyperlipidemia: Secondary | ICD-10-CM | POA: Diagnosis not present

## 2019-12-31 DIAGNOSIS — N920 Excessive and frequent menstruation with regular cycle: Secondary | ICD-10-CM | POA: Diagnosis not present

## 2019-12-31 DIAGNOSIS — Z87891 Personal history of nicotine dependence: Secondary | ICD-10-CM | POA: Diagnosis not present

## 2019-12-31 DIAGNOSIS — R202 Paresthesia of skin: Secondary | ICD-10-CM | POA: Diagnosis not present

## 2019-12-31 DIAGNOSIS — D5 Iron 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): Secondary | ICD-10-CM | POA: Diagnosis not present

## 2019-12-31 DIAGNOSIS — I10 Essential (primary) hypertension: Secondary | ICD-10-CM | POA: Diagnosis not present

## 2019-12-31 DIAGNOSIS — R771 Abnormality of globulin: Secondary | ICD-10-CM | POA: Diagnosis not present

## 2020-01-10 DIAGNOSIS — F330 Major depressive disorder, recurrent, mild: Secondary | ICD-10-CM | POA: Diagnosis not present

## 2020-01-17 ENCOUNTER — Encounter: Payer: Self-pay | Admitting: Podiatrist

## 2020-01-17 ENCOUNTER — Other Ambulatory Visit: Payer: Self-pay

## 2020-01-17 ENCOUNTER — Ambulatory Visit (INDEPENDENT_AMBULATORY_CARE_PROVIDER_SITE_OTHER): Payer: Medicare Other | Admitting: Podiatrist

## 2020-01-17 ENCOUNTER — Other Ambulatory Visit: Payer: Self-pay | Admitting: Podiatrist

## 2020-01-17 ENCOUNTER — Ambulatory Visit (INDEPENDENT_AMBULATORY_CARE_PROVIDER_SITE_OTHER): Payer: Medicare Other

## 2020-01-17 DIAGNOSIS — M19072 Primary osteoarthritis, left ankle and foot: Secondary | ICD-10-CM

## 2020-01-17 DIAGNOSIS — M79672 Pain in left foot: Secondary | ICD-10-CM

## 2020-01-17 NOTE — Progress Notes (Signed)
    Chief Complaint  Patient presents with  . Foot Pain    pt is here for left foot pain, pain is located on the left foot medial side of the foot, pt states that the left foot is elevated to the touch     HPI: Patient is 44 y.o. female who presents today for pain on the left foot medial ankle area. She relates the foot has hurt for over a year and the pain is gradually getting worse.    Review of Systems No fevers, chills, nausea, muscle aches, no difficulty breathing, no calf pain, no chest pain or shortness of breath.   Physical Exam  GENERAL APPEARANCE: Alert, conversant. Appropriately groomed. No acute distress.   VASCULAR: Pedal pulses palpable DP and PT bilateral.  Capillary refill time is immediate to all digits,  Proximal to distal cooling it warm to warm.  Digital hair growth is present bilateral   NEUROLOGIC: sensation is intact epicritically and protectively to 5.07 monofilament at 5/5 sites bilateral.  Light touch is intact bilateral, vibratory sensation intact bilateral, achilles tendon reflex is intact bilateral.   MUSCULOSKELETAL: acceptable muscle strength, tone and stability bilateral.  Pes planus deformity is noted on the left foot in comparison to the right.  Pain on the anterior medial aspect of the ankle near the talar head is also palpated.    DERMATOLOGIC: skin is warm, supple, and dry.  No open lesions noted.  No rash, no pre ulcerative lesions. Digital nails are asymptomatic.    xrays show arthritic changes at the talonavicular joint and the talocalcaneal joint on the left foot.  Increased uncovering of the talar head is also seen along with obliteration of the cyma line on the lateral view.     Assessment     ICD-10-CM   1. Foot pain, left  M79.672 DG Foot Complete Left  2. Primary osteoarthritis of left ankle  M19.072      Plan  Discussed etiology, pathology, conservative vs. Surgical therapies and at this time an injection was recommended.  The  patient agreed and a sterile skin prep was applied.  An injection consisting of kenalog and marcaine mixture was infiltrated into the talonavicular region of the left.  The patient tolerated this well and was given instructions for aftercare. She will be dispensed a trilock ankle brace on Friday as we were out of her size today and made an appontment for her to see derrick.  I also feel she would benefit from orthotics and will have her see Liliane Channel for these as well.

## 2020-01-17 NOTE — Patient Instructions (Signed)
Antiinflammatories such as advil, ibuprofen, aleve are helpful for arthritic pain.  Voltaren Gel is also available at the pharmacy and is helpful for arthritic pain as well-  You would benefit from orthotics- we will set you up to be seen and will check benefits.    Ankle Pain The ankle joint holds your body weight and allows you to move around. Ankle pain can occur on either side or the back of one ankle or both ankles. Ankle pain may be sharp and burning or dull and aching. There may be tenderness, stiffness, redness, or warmth around the ankle. Many things can cause ankle pain, including an injury to the area and overuse of the ankle. Follow these instructions at home: Activity  Rest your ankle as told by your health care provider. Avoid any activities that cause ankle pain.  Do not use the injured limb to support your body weight until your health care provider says that you can. Use crutches as told by your health care provider.  Do exercises as told by your health care provider.  Ask your health care provider when it is safe to drive if you have a brace on your ankle. If you have a brace:  Wear the brace as told by your health care provider. Remove it only as told by your health care provider.  Loosen the brace if your toes tingle, become numb, or turn cold and blue.  Keep the brace clean.  If the brace is not waterproof: ? Do not let it get wet. ? Cover it with a watertight covering when you take a bath or shower. If you were given an elastic bandage:   Remove it when you take a bath or a shower.  Try not to move your ankle very much, but wiggle your toes from time to time. This helps to prevent swelling.  Adjust the bandage to make it more comfortable if it feels too tight.  Loosen the bandage if you have numbness or tingling in your foot or if your foot turns cold and blue. Managing pain, stiffness, and swelling   If directed, put ice on the painful area. ? If you  have a removable brace or elastic bandage, remove it as told by your health care provider. ? Put ice in a plastic bag. ? Place a towel between your skin and the bag. ? Leave the ice on for 20 minutes, 2-3 times a day.  Move your toes often to avoid stiffness and to lessen swelling.  Raise (elevate) your ankle above the level of your heart while you are sitting or lying down. General instructions  Record information about your pain. Writing down the following may be helpful for you and your health care provider: ? How often you have ankle pain. ? Where the pain is located. ? What the pain feels like.  If treatment involves wearing a prescribed shoe or insole, make sure you wear it correctly and for as long as told by your health care provider.  Take over-the-counter and prescription medicines only as told by your health care provider.  Keep all follow-up visits as told by your health care provider. This is important. Contact a health care provider if:  Your pain gets worse.  Your pain is not relieved with medicines.  You have a fever or chills.  You are having more trouble with walking.  You have new symptoms. Get help right away if:  Your foot, leg, toes, or ankle: ? Tingles or becomes numb. ?  Becomes swollen. ? Turns pale or blue. Summary  Ankle pain can occur on either side or the back of one ankle or both ankles.  Ankle pain may be sharp and burning or dull and aching.  Rest your ankle as told by your health care provider. If told, apply ice to the area.  Take over-the-counter and prescription medicines only as told by your health care provider. This information is not intended to replace advice given to you by your health care provider. Make sure you discuss any questions you have with your health care provider. Document Revised: 12/08/2018 Document Reviewed: 02/25/2018 Elsevier Patient Education  [place].

## 2020-01-20 ENCOUNTER — Encounter: Payer: Self-pay | Admitting: Family Medicine

## 2020-01-20 ENCOUNTER — Encounter: Payer: Medicare Other | Admitting: Family Medicine

## 2020-01-20 NOTE — Progress Notes (Signed)
Patient did not keep appointment today. She may call to reschedule.  

## 2020-02-17 ENCOUNTER — Telehealth: Payer: Self-pay | Admitting: Podiatrist

## 2020-02-17 NOTE — Telephone Encounter (Signed)
Left message for pt to call to discuss further orthotic benefits. With pts insurance orthotics are not covered so she would be responsible for the 438.00

## 2020-02-21 ENCOUNTER — Ambulatory Visit: Payer: Medicare Other

## 2020-03-22 ENCOUNTER — Ambulatory Visit: Payer: Medicare Other

## 2020-03-24 DIAGNOSIS — Z124 Encounter for screening for malignant neoplasm of cervix: Secondary | ICD-10-CM | POA: Diagnosis not present

## 2020-03-24 DIAGNOSIS — R202 Paresthesia of skin: Secondary | ICD-10-CM | POA: Diagnosis not present

## 2020-03-24 DIAGNOSIS — Z87891 Personal history of nicotine dependence: Secondary | ICD-10-CM | POA: Diagnosis not present

## 2020-03-24 DIAGNOSIS — E782 Mixed hyperlipidemia: Secondary | ICD-10-CM | POA: Diagnosis not present

## 2020-03-24 DIAGNOSIS — N920 Excessive and frequent menstruation with regular cycle: Secondary | ICD-10-CM | POA: Diagnosis not present

## 2020-03-24 DIAGNOSIS — D5 Iron 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): Secondary | ICD-10-CM | POA: Diagnosis not present

## 2020-03-24 DIAGNOSIS — I10 Essential (primary) hypertension: Secondary | ICD-10-CM | POA: Diagnosis not present

## 2020-03-24 DIAGNOSIS — R771 Abnormality of globulin: Secondary | ICD-10-CM | POA: Diagnosis not present

## 2020-03-28 ENCOUNTER — Institutional Professional Consult (permissible substitution): Payer: Medicare Other | Admitting: Plastic Surgery

## 2020-04-07 ENCOUNTER — Other Ambulatory Visit: Payer: Self-pay

## 2020-04-07 ENCOUNTER — Ambulatory Visit
Admission: RE | Admit: 2020-04-07 | Discharge: 2020-04-07 | Disposition: A | Discharge status: Home / Self Care | Payer: Medicare Other | Source: Ambulatory Visit | Attending: Internal Medicine | Admitting: Internal Medicine

## 2020-04-07 DIAGNOSIS — Z1231 Encounter for screening mammogram for malignant neoplasm of breast: Secondary | ICD-10-CM | POA: Diagnosis not present

## 2020-05-07 DIAGNOSIS — Z23 Encounter for immunization: Secondary | ICD-10-CM | POA: Diagnosis not present

## 2020-05-26 ENCOUNTER — Encounter (HOSPITAL_COMMUNITY): Payer: Self-pay | Admitting: Emergency Medicine

## 2020-05-26 ENCOUNTER — Emergency Department (HOSPITAL_COMMUNITY)
Admission: EM | Admit: 2020-05-26 | Discharge: 2020-05-27 | Disposition: A | Discharge status: Home / Self Care | Payer: Medicare Other | Attending: Emergency Medicine | Admitting: Emergency Medicine

## 2020-05-26 ENCOUNTER — Other Ambulatory Visit: Payer: Self-pay

## 2020-05-26 DIAGNOSIS — T407X1A Poisoning by cannabis (derivatives), accidental (unintentional), initial encounter: Secondary | ICD-10-CM | POA: Diagnosis not present

## 2020-05-26 DIAGNOSIS — T6591XA Toxic effect of unspecified substance, accidental (unintentional), initial encounter: Secondary | ICD-10-CM

## 2020-05-26 DIAGNOSIS — Z7982 Long term (current) use of aspirin: Secondary | ICD-10-CM | POA: Diagnosis not present

## 2020-05-26 DIAGNOSIS — R000 Tachycardia, unspecified: Secondary | ICD-10-CM | POA: Insufficient documentation

## 2020-05-26 DIAGNOSIS — I10 Essential (primary) hypertension: Secondary | ICD-10-CM | POA: Diagnosis not present

## 2020-05-26 DIAGNOSIS — R0689 Other abnormalities of breathing: Secondary | ICD-10-CM | POA: Diagnosis not present

## 2020-05-26 DIAGNOSIS — E1165 Type 2 diabetes mellitus with hyperglycemia: Secondary | ICD-10-CM | POA: Diagnosis not present

## 2020-05-26 NOTE — ED Provider Notes (Signed)
[facility] DEPT Provider Note   CSN: [id number] Arrival date & time: 05/26/20  2146     History Chief Complaint  Patient presents with  . Ingestion    Michelle Bell is a 44 y.o. female.  The history is provided by the patient and medical records.    44 y.o. F with hx of anemia, HTN, presenting to the ED for anxiety after taking delta 8.  Patient reports she ate dinner with family and saw a cookie on the counter and ate it without knowing it contained delta 8.  States she does not use drugs and states "it sent me for a ride".  States she was initially freaking out and hallucinating but the effects do seem to be wearing off.  States her mouth feels dry but otherwise feeling much better.    Past Medical History:  Diagnosis Date  . Anemia   . Groin mass    left  . History of blood transfusion   . Hypertension     Patient Active Problem List   Diagnosis Date Noted  . Iron deficiency anemia 10/14/2018    Past Surgical History:  Procedure Laterality Date  . CESAREAN SECTION    . MASS EXCISION Left 12/23/2016   Procedure: EXCISION LEFT GROIN MASS;  Surgeon: Coralie Keens, MD;  Location: [facility] OR[facility];  Service: General;  Laterality: Left;     OB History   No obstetric history on file.     Family History  Problem Relation Age of Onset  . Breast cancer Maternal Aunt     Social History   Tobacco Use  . Smoking status: Never Smoker  . Smokeless tobacco: Never Used  Substance Use Topics  . Alcohol use: No  . Drug use: No    Home Medications Prior to Admission medications   Medication Sig Start Date End Date Taking? Authorizing Provider  acetaminophen (TYLENOL) 325 MG tablet Take 325 mg by mouth daily as needed for moderate pain.    [provider]  aspirin EC 81 MG tablet Take 81 mg by mouth daily.    [provider]  hydrochlorothiazide (HYDRODIURIL) 12.5 MG tablet Take 12.5 mg by mouth daily.    [provider]  ibuprofen (ADVIL,MOTRIN) 200 MG tablet Take 200 mg by mouth daily as needed for headache or moderate pain.    [provider]  Multiple Vitamin (MULTIVITAMIN WITH MINERALS) TABS tablet Take 1 tablet by mouth daily.    [provider]  oxyCODONE (OXY IR/ROXICODONE) 5 MG immediate release tablet Take 1-2 tablets (5-10 mg total) by mouth every 4 (four) hours as needed for moderate pain or severe pain. 12/23/16   Coralie Keens, MD    Allergies    Patient has no known allergies.  Review of Systems   Review of Systems  Psychiatric/Behavioral: The patient is nervous/anxious.   All other systems reviewed and are negative.   Physical Exam Updated Vital Signs BP (!) 150/98 (BP Location: Right Arm)   Pulse (!) 140   Temp 98.9 F (37.2 C) (Oral)   Resp (!) 30   Ht 5\' 3"  (1.6 m)   Wt 91.6 kg   SpO2 100%   BMI 35.78 kg/m   Physical Exam Vitals and nursing note reviewed.  Constitutional:      Appearance: She is well-developed.     Comments: Overall calm, NAD  HENT:     Head: Normocephalic and atraumatic.  Eyes:     Conjunctiva/sclera: Conjunctivae normal.  Pupils: Pupils are equal, round, and reactive to light.  Cardiovascular:     Rate and Rhythm: Regular rhythm. Tachycardia present.     Heart sounds: Normal heart sounds.     Comments: HR 110 during exam Pulmonary:     Effort: Pulmonary effort is normal.     Breath sounds: Normal breath sounds.  Abdominal:     General: Bowel sounds are normal.     Palpations: Abdomen is soft.  Musculoskeletal:        General: Normal range of motion.     Cervical back: Normal range of motion.  Skin:    General: Skin is warm and dry.  Neurological:     Mental Status: She is alert and oriented to person, place, and time.     ED Results / Procedures / Treatments   Labs (all labs ordered are listed, but only abnormal results are displayed) Labs Reviewed - No data to display  EKG None  Radiology  No results found.  Procedures Procedures (including critical care time)  Medications Ordered in ED Medications - No data to display  ED Course  I have reviewed the triage vital signs and the nursing notes.  Pertinent labs & imaging results that were available during my care of the patient were reviewed by me and considered in my medical decision making (see chart for details).    MDM Rules/Calculators/A&P  44 year old female who with anxiety after unknowingly eating a cookie that contained delta 8.  States she has never used illicit drugs before and this "took her on her trip".  Heart rate has begun to trend down here in the ED, still mildly tachycardic on initial exam.  She does report feeling better as time has passed.  She denies any chest pain or shortness of breath.  No nausea or vomiting.  She denies any other coingestions or alcohol abuse tonight.  We will plan to observe here, symptoms likely will pass with time.  12:21 AM Patient has been observed here for 2.5 hours, states she feels back to normal.  HR has trended down nicely, now 96 on repeat eval.  She is tolerating Po without issue.  Brother is here to pick her up.  Stable for discharge.  Encouraged to refrain from illicit substances, especially if unsure what they are.  Follow-up with PCP.  Return here for any new/acute changes.  Final Clinical Impression(s) / ED Diagnoses Final diagnoses:  Ingestion of substance, accidental or unintentional, initial encounter    Rx / DC Orders ED Discharge Orders    None       Larene Pickett, PA-C 05/27/20 0025    Maudie Flakes, MD 05/27/20 [phone]

## 2020-05-26 NOTE — ED Triage Notes (Signed)
Patient took Delta 8. When EMS arrived the patient was having a panic attack. The initial HR was 180, but EMS was able to calm the patient down some to a final HR of 140. Though she can become confused at times and hears audible hallucinations, she is A&O x4. EMS administered 250cc of fluid.   EMS vitals: 140 HR 140/80 BP 30 RR  258 CBG

## 2020-05-28 DIAGNOSIS — Z23 Encounter for immunization: Secondary | ICD-10-CM | POA: Diagnosis not present

## 2020-05-30 DIAGNOSIS — R202 Paresthesia of skin: Secondary | ICD-10-CM | POA: Diagnosis not present

## 2020-05-30 DIAGNOSIS — N920 Excessive and frequent menstruation with regular cycle: Secondary | ICD-10-CM | POA: Diagnosis not present

## 2020-05-30 DIAGNOSIS — R771 Abnormality of globulin: Secondary | ICD-10-CM | POA: Diagnosis not present

## 2020-05-30 DIAGNOSIS — I10 Essential (primary) hypertension: Secondary | ICD-10-CM | POA: Diagnosis not present

## 2020-05-30 DIAGNOSIS — E782 Mixed hyperlipidemia: Secondary | ICD-10-CM | POA: Diagnosis not present

## 2020-05-30 DIAGNOSIS — D5 Iron 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): Secondary | ICD-10-CM | POA: Diagnosis not present

## 2020-05-30 DIAGNOSIS — Z87891 Personal history of nicotine dependence: Secondary | ICD-10-CM | POA: Diagnosis not present

## 2020-06-09 ENCOUNTER — Encounter: Payer: Self-pay | Admitting: Plastic Surgery

## 2020-06-09 ENCOUNTER — Ambulatory Visit (INDEPENDENT_AMBULATORY_CARE_PROVIDER_SITE_OTHER): Payer: Medicare Other | Admitting: Plastic Surgery

## 2020-06-09 ENCOUNTER — Other Ambulatory Visit: Payer: Self-pay

## 2020-06-09 DIAGNOSIS — N62 Hypertrophy of breast: Secondary | ICD-10-CM | POA: Diagnosis not present

## 2020-06-09 DIAGNOSIS — M549 Dorsalgia, unspecified: Secondary | ICD-10-CM | POA: Insufficient documentation

## 2020-06-09 DIAGNOSIS — M546 Pain in thoracic spine: Secondary | ICD-10-CM

## 2020-06-09 DIAGNOSIS — G8929 Other chronic pain: Secondary | ICD-10-CM | POA: Diagnosis not present

## 2020-06-09 DIAGNOSIS — M542 Cervicalgia: Secondary | ICD-10-CM | POA: Diagnosis not present

## 2020-06-09 NOTE — Progress Notes (Signed)
Patient ID: Michelle Bell, female    DOB: 08/28/1976, 44 y.o.   MRN: [id number]   Chief Complaint  Patient presents with  . Breast Problem    Mammary Hyperplasia: The patient was joined by her social service representative. The patient is a 44 y.o. female with a history of mammary hyperplasia for several years.  She has extremely large breasts causing symptoms that include the following: Back pain in the upper and lower back, including neck pain. She pulls or pins her bra straps to provide better lift and relief of the pressure and pain. She notices relief by holding her breast up manually.  Her shoulder straps cause grooves and pain and pressure that requires padding for relief. Pain medication is sometimes required with motrin and tylenol.  Activities that are hindered by enlarged breasts include: exercise and running.  She has tried supportive clothing as well as fitted bras without improvement.  Her breasts are extremely large and fairly symmetric with the left breast slightly lower than the right.  She has hyperpigmentation of the inframammary area on both sides.  The sternal to nipple distance on the right is 35 cm and the left is 37 cm.  The IMF distance is 20 cm.  She is 5 feet 3 inches tall and weighs 201 pounds.  Preoperative bra size = 42 DD cup.  She would like to be a C cup.  The estimated excess breast tissue to be removed at the time of surgery = 650 grams on the left and 650 grams on the right.  Mammogram history: Aug 2021 and was normal.  We have requested the results.  Family history of breast cancer:  positive.  Tobacco use:  None and no DM history.  No previous surgery.  She has grade 3 ptosis.    Review of Systems  Constitutional: Positive for activity change. Negative for appetite change.  HENT: Negative.   Eyes: Negative.   Respiratory: Negative.  Negative for chest tightness and shortness of breath.   Cardiovascular: Negative for leg swelling.   Gastrointestinal: Negative for abdominal distention.  Endocrine: Negative.   Genitourinary: Negative.   Musculoskeletal: Positive for back pain and neck pain.  Hematological: Negative.   Psychiatric/Behavioral: Negative.     Past Medical History:  Diagnosis Date  . Anemia   . Groin mass    left  . History of blood transfusion   . Hypertension     Past Surgical History:  Procedure Laterality Date  . CESAREAN SECTION    . MASS EXCISION Left 12/23/2016   Procedure: EXCISION LEFT GROIN MASS;  Surgeon: Coralie Keens, MD;  Location: [facility];  Service: General;  Laterality: Left;      Current Outpatient Medications:  .  acetaminophen (TYLENOL) 325 MG tablet, Take 325 mg by mouth daily as needed for moderate pain., Disp: , Rfl:  .  aspirin EC 81 MG tablet, Take 81 mg by mouth daily., Disp: , Rfl:  .  hydrochlorothiazide (HYDRODIURIL) 12.5 MG tablet, Take 12.5 mg by mouth daily., Disp: , Rfl:  .  ibuprofen (ADVIL,MOTRIN) 200 MG tablet, Take 200 mg by mouth daily as needed for headache or moderate pain., Disp: , Rfl:  .  Multiple Vitamin (MULTIVITAMIN WITH MINERALS) TABS tablet, Take 1 tablet by mouth daily., Disp: , Rfl:  .  oxyCODONE (OXY IR/ROXICODONE) 5 MG immediate release tablet, Take 1-2 tablets (5-10 mg total) by mouth every 4 (four) hours as needed for moderate pain or severe pain.,  Disp: 30 tablet, Rfl: 0   Objective:   Vitals:   06/09/20 1110  Pulse: 86  Temp: 98 F (36.7 C)  SpO2: 97%    Physical Exam Vitals and nursing note reviewed.  Constitutional:      Appearance: Normal appearance.  HENT:     Head: Normocephalic and atraumatic.  Cardiovascular:     Rate and Rhythm: Normal rate.     Pulses: Normal pulses.  Pulmonary:     Effort: Pulmonary effort is normal. No respiratory distress.  Abdominal:     General: Abdomen is flat. There is no distension.     Tenderness: There is no abdominal tenderness.  Neurological:     General: No focal deficit present.      Mental Status: She is alert and oriented to person, place, and time.  Psychiatric:        Mood and Affect: Mood normal.        Behavior: Behavior normal.        Thought Content: Thought content normal.     Assessment & Plan:  Chronic bilateral thoracic back pain  Neck pain  Symptomatic mammary hypertrophy  The patient is a good candidate for a bilateral breast reduction with possible liposuction She will need an overnight stay.  We talked about the likelihood of a change in nipple areola sensation, scars, asymmetry and drains.  Patient is not likely to have kids at her age but breast-feeding would not be likely.  I also expressed that this was not a cosmetic procedure but one that is done in her case for reduction of the weight of the breasts.  She showed me a picture of what looked like a 44 year old model who was 5 feet 9 inches tall and 100 pounds.  I expressed that that will not be what she looks like after surgery.  She could expect to have smaller breasts and slightly perkier breasts.  The patient acknowledged understanding. Pictures were obtained of the patient and placed in the chart with the patient's or guardian's permission.   [place], DO

## 2020-06-11 ENCOUNTER — Encounter (HOSPITAL_COMMUNITY): Payer: Self-pay

## 2020-06-11 ENCOUNTER — Emergency Department (HOSPITAL_COMMUNITY): Payer: Medicare Other

## 2020-06-11 ENCOUNTER — Emergency Department (HOSPITAL_COMMUNITY)
Admission: EM | Admit: 2020-06-11 | Discharge: 2020-06-11 | Disposition: A | Discharge status: Home / Self Care | Payer: Medicare Other | Attending: Emergency Medicine | Admitting: Emergency Medicine

## 2020-06-11 DIAGNOSIS — E876 Hypokalemia: Secondary | ICD-10-CM | POA: Diagnosis not present

## 2020-06-11 DIAGNOSIS — Z79899 Other long term (current) drug therapy: Secondary | ICD-10-CM | POA: Insufficient documentation

## 2020-06-11 DIAGNOSIS — Z7982 Long term (current) use of aspirin: Secondary | ICD-10-CM | POA: Diagnosis not present

## 2020-06-11 DIAGNOSIS — I10 Essential (primary) hypertension: Secondary | ICD-10-CM | POA: Insufficient documentation

## 2020-06-11 DIAGNOSIS — R0789 Other chest pain: Secondary | ICD-10-CM | POA: Insufficient documentation

## 2020-06-11 DIAGNOSIS — R079 Chest pain, unspecified: Secondary | ICD-10-CM | POA: Diagnosis not present

## 2020-06-11 DIAGNOSIS — R457 State of emotional shock and stress, unspecified: Secondary | ICD-10-CM | POA: Diagnosis not present

## 2020-06-11 LAB — RAPID URINE DRUG SCREEN, HOSP PERFORMED
Amphetamines: NOT DETECTED
Barbiturates: NOT DETECTED
Benzodiazepines: NOT DETECTED
Cocaine: NOT DETECTED
Opiates: NOT DETECTED
Tetrahydrocannabinol: NOT DETECTED

## 2020-06-11 LAB — CBC WITH DIFFERENTIAL/PLATELET
Abs Immature Granulocytes: 0.02 10*3/uL (ref 0.00–0.07)
Basophils Absolute: 0.1 10*3/uL (ref 0.0–0.1)
Basophils Relative: 1 %
Eosinophils Absolute: 0.2 10*3/uL (ref 0.0–0.5)
Eosinophils Relative: 2 %
HCT: 38.6 % (ref 36.0–46.0)
Hemoglobin: 12.7 g/dL (ref 12.0–15.0)
Immature Granulocytes: 0 %
Lymphocytes Relative: 26 %
Lymphs Abs: 2.1 10*3/uL (ref 0.7–4.0)
MCH: 30.2 pg (ref 26.0–34.0)
MCHC: 32.9 g/dL (ref 30.0–36.0)
MCV: 91.7 fL (ref 80.0–100.0)
Monocytes Absolute: 0.5 10*3/uL (ref 0.1–1.0)
Monocytes Relative: 6 %
Neutro Abs: 5.3 10*3/uL (ref 1.7–7.7)
Neutrophils Relative %: 65 %
Platelets: 487 10*3/uL — ABNORMAL HIGH (ref 150–400)
RBC: 4.21 MIL/uL (ref 3.87–5.11)
RDW: 13 % (ref 11.5–15.5)
WBC: 8.2 10*3/uL (ref 4.0–10.5)
nRBC: 0 % (ref 0.0–0.2)

## 2020-06-11 LAB — URINALYSIS, ROUTINE W REFLEX MICROSCOPIC
Bacteria, UA: NONE SEEN
Bilirubin Urine: NEGATIVE
Glucose, UA: NEGATIVE mg/dL
Ketones, ur: 5 mg/dL — AB
Leukocytes,Ua: NEGATIVE
Nitrite: NEGATIVE
Protein, ur: NEGATIVE mg/dL
Specific Gravity, Urine: 1.003 — ABNORMAL LOW (ref 1.005–1.030)
pH: 7 (ref 5.0–8.0)

## 2020-06-11 LAB — BASIC METABOLIC PANEL
Anion gap: 11 (ref 5–15)
BUN: 8 mg/dL (ref 6–20)
CO2: 27 mmol/L (ref 22–32)
Calcium: 9.7 mg/dL (ref 8.9–10.3)
Chloride: 101 mmol/L (ref 98–111)
Creatinine, Ser: 0.67 mg/dL (ref 0.44–1.00)
GFR, Estimated: >60 mL/min (ref >60)
Glucose, Bld: 106 mg/dL — ABNORMAL HIGH (ref 70–99)
Potassium: 3.3 mmol/L — ABNORMAL LOW (ref 3.5–5.1)
Sodium: 139 mmol/L (ref 135–145)

## 2020-06-11 LAB — TROPONIN I (HIGH SENSITIVITY): Troponin I (High Sensitivity): 2 ng/L (ref <18)

## 2020-06-11 LAB — I-STAT BETA HCG BLOOD, ED (MC, WL, AP ONLY): I-stat hCG, quantitative: <5 m[IU]/mL (ref <5)

## 2020-06-11 MED ORDER — POTASSIUM CHLORIDE ER 10 MEQ PO TBCR: 10.0000 meq | EXTENDED_RELEASE_TABLET | Freq: Two times a day (BID) | ORAL | 0 refills | Status: DC

## 2020-06-11 NOTE — ED Provider Notes (Signed)
[facility] DEPT Provider Note   CSN: [id number] Arrival date & time: 06/11/20  1132     History Chief Complaint  Patient presents with  . Anxiety    Michelle Bell is a 44 y.o. female.  HPI  HPI: A 44 year old patient with a history of hypertension and obesity presents for evaluation of chest pain. Initial onset of pain was more than 6 hours ago. The patient's chest pain is described as heaviness/pressure/tightness and is not worse with exertion. The patient's chest pain is middle- or left-sided, is not well-localized, is not sharp and does not radiate to the arms/jaw/neck. The patient does not complain of nausea and denies diaphoresis. The patient has no history of stroke, has no history of peripheral artery disease, has not smoked in the past 90 days, denies any history of treated diabetes, has no relevant family history of coronary artery disease (first degree relative at less than age 33) and has no history of hypercholesterolemia.    Michelle Bell is a 44 y.o. female, with a history of anemia and HTN, presenting to the ED with chief complaint of chest discomfort.  When she began the interview, she states, "I think maybe there is something wrong with my iron being low or a problem with my thyroid." She states she has not had any issues with her thyroid in the past as far as she knows.  She settled upon complaining of chest discomfort that she states was present when she awoke around 8 AM this morning.  She describes this discomfort rather vaguely, but then settles upon a description of a squeezing pain throughout the chest.  She states, "I guess I would rate it 10/10 at first because I did not know what was going on.  Maybe this happened because I took some vitamin D3 today."  She states it quickly became a mild soreness and she thinks it resolved around 11 AM.   Denies fever/chills, cough, shortness of breath, back pain, neurologic  symptoms, syncope, diaphoresis, N/V/D, lower extremity edema/pain, or any other complaints.  Past Medical History:  Diagnosis Date  . Anemia   . Groin mass    left  . History of blood transfusion   . Hypertension     Patient Active Problem List   Diagnosis Date Noted  . Back pain 06/09/2020  . Neck pain 06/09/2020  . Symptomatic mammary hypertrophy 06/09/2020  . Iron deficiency anemia 10/14/2018    Past Surgical History:  Procedure Laterality Date  . CESAREAN SECTION    . MASS EXCISION Left 12/23/2016   Procedure: EXCISION LEFT GROIN MASS;  Surgeon: Coralie Keens, MD;  Location: [facility] OR[facility];  Service: General;  Laterality: Left;     OB History   No obstetric history on file.     Family History  Problem Relation Age of Onset  . Breast cancer Maternal Aunt     Social History   Tobacco Use  . Smoking status: Never Smoker  . Smokeless tobacco: Never Used  Substance Use Topics  . Alcohol use: No  . Drug use: No    Home Medications Prior to Admission medications   Medication Sig Start Date End Date Taking? Authorizing Provider  acetaminophen (TYLENOL) 325 MG tablet Take 325 mg by mouth daily as needed for moderate pain.    [provider]  aspirin EC 81 MG tablet Take 81 mg by mouth daily.    [provider]  hydrochlorothiazide (HYDRODIURIL) 12.5 MG tablet Take 12.5 mg  by mouth daily.    [provider]  ibuprofen (ADVIL,MOTRIN) 200 MG tablet Take 200 mg by mouth daily as needed for headache or moderate pain.    [provider]  Multiple Vitamin (MULTIVITAMIN WITH MINERALS) TABS tablet Take 1 tablet by mouth daily.    [provider]  oxyCODONE (OXY IR/ROXICODONE) 5 MG immediate release tablet Take 1-2 tablets (5-10 mg total) by mouth every 4 (four) hours as needed for moderate pain or severe pain. 12/23/16   Coralie Keens, MD  potassium chloride (KLOR-CON) 10 MEQ tablet Take 1 tablet (10 mEq total) by mouth 2 (two) times  daily for 5 days. 06/11/20 06/16/20  Ether Wolters, Helane Gunther, PA-C    Allergies    Patient has no known allergies.  Review of Systems   Review of Systems  Constitutional: Negative for chills, diaphoresis and fever.  Respiratory: Negative for cough and shortness of breath.   Cardiovascular: Positive for chest pain. Negative for leg swelling.  Gastrointestinal: Negative for abdominal pain, diarrhea, nausea and vomiting.  Genitourinary: Negative for dysuria, frequency and hematuria.  Musculoskeletal: Negative for back pain.  Neurological: Negative for dizziness, syncope and weakness.  All other systems reviewed and are negative.   Physical Exam Updated Vital Signs BP (!) 134/104 (BP Location: Right Arm)   Pulse 86   Temp 98 F (36.7 C) (Oral)   Resp 17   Ht 5\' 3"  (1.6 m)   Wt 91.2 kg   LMP 05/22/2020 (Approximate)   SpO2 98%   BMI 35.61 kg/m   Physical Exam Vitals and nursing note reviewed.  Constitutional:      General: She is not in acute distress.    Appearance: She is well-developed. She is not diaphoretic.  HENT:     Head: Normocephalic and atraumatic.     Mouth/Throat:     Mouth: Mucous membranes are moist.     Pharynx: Oropharynx is clear.  Eyes:     Conjunctiva/sclera: Conjunctivae normal.  Cardiovascular:     Rate and Rhythm: Normal rate and regular rhythm.     Pulses: Normal pulses.          Radial pulses are 2+ on the right side and 2+ on the left side.       Posterior tibial pulses are 2+ on the right side and 2+ on the left side.     Heart sounds: Normal heart sounds.     Comments: Tactile temperature in the extremities appropriate and equal bilaterally. Pulmonary:     Effort: Pulmonary effort is normal. No respiratory distress.     Breath sounds: Normal breath sounds.  Abdominal:     Palpations: Abdomen is soft.     Tenderness: There is no abdominal tenderness. There is no guarding.  Musculoskeletal:     Cervical back: Neck supple.     Right lower leg: No  edema.     Left lower leg: No edema.  Lymphadenopathy:     Cervical: No cervical adenopathy.  Skin:    General: Skin is warm and dry.  Neurological:     Mental Status: She is alert and oriented to person, place, and time.     Comments: No noted acute cognitive deficit. Sensation grossly intact to light touch in the extremities.   Grip strengths equal bilaterally.   Strength 5/5 in all extremities.  No gait disturbance.  Coordination intact.  Cranial nerves III-XII grossly intact.  Handles oral secretions without noted difficulty.  No noted phonation or speech deficit.  No facial droop.   Psychiatric:        Mood and Affect: Mood and affect normal.        Speech: Speech normal.        Behavior: Behavior normal.     ED Results / Procedures / Treatments   Labs (all labs ordered are listed, but only abnormal results are displayed) Labs Reviewed  BASIC METABOLIC PANEL - Abnormal; Notable for the following components:      Result Value   Potassium 3.3 (*)    Glucose, Bld 106 (*)    All other components within normal limits  CBC WITH DIFFERENTIAL/PLATELET - Abnormal; Notable for the following components:   Platelets 487 (*)    All other components within normal limits  URINALYSIS, ROUTINE W REFLEX MICROSCOPIC - Abnormal; Notable for the following components:   Color, Urine COLORLESS (*)    Specific Gravity, Urine 1.003 (*)    Hgb urine dipstick SMALL (*)    Ketones, ur 5 (*)    All other components within normal limits  RAPID URINE DRUG SCREEN, HOSP PERFORMED  I-STAT BETA HCG BLOOD, ED ([facility], [facility], [facility] ONLY)  TROPONIN I (HIGH SENSITIVITY)    EKG EKG Interpretation  Date/Time:  Sunday June 11 2020 11:55:55 EDT Ventricular Rate:  84 PR Interval:    QRS Duration: 83 QT Interval:  371 QTC Calculation: 439 R Axis:   2 Text Interpretation: Sinus rhythm 12 Lead; Mason-Likar Confirmed by Lennice Sites [phone]) on 06/11/2020 4:11:40 PM   Radiology DG Chest 2 View  Result  Date: 06/11/2020 CLINICAL DATA:  Intermittent right chest pain which began 3 days prior, history of hypertension EXAM: CHEST - 2 VIEW COMPARISON:  Radiograph 10/29/2015 FINDINGS: Chronic elevation of the right hemidiaphragm is similar to prior. No consolidation, features of edema, pneumothorax, or effusion. Pulmonary vascularity is normally distributed. The cardiomediastinal contours are unremarkable. No acute osseous or soft tissue abnormality. Telemetry leads overlie the chest. IMPRESSION: No acute cardiopulmonary abnormality. Stable chronic elevation of the right hemidiaphragm. Electronically Signed   By: Lovena Le M.D.   On: 06/11/2020 17:33    Procedures Procedures (including critical care time)  Medications Ordered in ED Medications - No data to display  ED Course  I have reviewed the triage vital signs and the nursing notes.  Pertinent labs & imaging results that were available during my care of the patient were reviewed by me and considered in my medical decision making (see chart for details).  Clinical Course as of Jun 11 1805  Sun Jun 11, 2020  1757 Patient continues to be symptom-free.   [SJ]    Clinical Course User Index [SJ] Chigozie Basaldua, Helane Gunther, PA-C   MDM Rules/Calculators/A&P HEAR Score: 3                        Patient presents with chest discomfort.  Resolved prior to arrival and did not recur. Low suspicion for ACS. No high risk/suspicious features; no exertional chest pain, vomiting, diaphoresis, or radiation. HEART score is 3, indicating low risk for a cardiac event.  EKG without evidence of acute ischemia or pathologic/symptomatic arrhythmia.  Troponin negative. Wells criteria score is 0, indicating low risk for PE.   Dissection was considered, but thought less likely base on: History and description of the pain are not suggestive, patient is not ill-appearing, lack of risk factors, equal bilateral pulses, lack of neurologic deficits, no widened mediastinum on chest  x-ray.  I reviewed  and interpreted the patient's labs and radiological studies. Mild hypokalemia noted and addressed with outpatient supplementation and dietary recommendations.    The patient was given instructions for home care as well as return precautions. Patient voices understanding of these instructions, accepts the plan, and is comfortable with discharge.  Vitals:   06/11/20 1149 06/11/20 1332 06/11/20 1518 06/11/20 1715  BP:  (!) 144/97 (!) 134/104 (!) 139/95  Pulse:  88 86 85  Resp: 20 17 17 20   Temp:  98 F (36.7 C)    TempSrc:  Oral    SpO2:  98% 98% 98%  Weight:      Height:        Hypertension noted and discussed with the patient.  She will follow-up on this matter with her PCP.  Final Clinical Impression(s) / ED Diagnoses Final diagnoses:  Chest discomfort  Hypokalemia    Rx / DC Orders ED Discharge Orders         Ordered    potassium chloride (KLOR-CON) 10 MEQ tablet  2 times daily        06/11/20 1804           Lorayne Bender, PA-C 06/11/20 Hubbard, Fletcher, DO 06/11/20 1929

## 2020-06-11 NOTE — ED Notes (Signed)
Patient transported to X-ray 

## 2020-06-11 NOTE — Discharge Instructions (Signed)
  Your work-up today was overall reassuring.  Follow-up with your primary care provider or cardiology on this matter.  Your potassium was lower than normal.  Please be sure to take the potassium supplement, as prescribed.  There are also dietary changes that can be made to maintain a normal potassium.  Follow-up with your primary care provider on this matter for retesting of your potassium.  Return to the emergency department for recurrence of chest pain, shortness of breath, abdominal pain, passing out, dizziness, or any other major concerns.

## 2020-06-11 NOTE — ED Triage Notes (Signed)
Pt arrived via EMS, from home, states she thinks her iron might be low because she "hasnt had any bloodwork done in a long time", she took her supplements this morning and then became very anxious.

## 2020-06-16 DIAGNOSIS — Z131 Encounter for screening for diabetes mellitus: Secondary | ICD-10-CM | POA: Diagnosis not present

## 2020-06-16 DIAGNOSIS — E782 Mixed hyperlipidemia: Secondary | ICD-10-CM | POA: Diagnosis not present

## 2020-06-16 DIAGNOSIS — R771 Abnormality of globulin: Secondary | ICD-10-CM | POA: Diagnosis not present

## 2020-06-16 DIAGNOSIS — N920 Excessive and frequent menstruation with regular cycle: Secondary | ICD-10-CM | POA: Diagnosis not present

## 2020-06-16 DIAGNOSIS — R202 Paresthesia of skin: Secondary | ICD-10-CM | POA: Diagnosis not present

## 2020-06-16 DIAGNOSIS — I10 Essential (primary) hypertension: Secondary | ICD-10-CM | POA: Diagnosis not present

## 2020-06-16 DIAGNOSIS — D5 Iron 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): Secondary | ICD-10-CM | POA: Diagnosis not present

## 2020-06-16 DIAGNOSIS — Z87891 Personal history of nicotine dependence: Secondary | ICD-10-CM | POA: Diagnosis not present

## 2020-06-16 DIAGNOSIS — N898 Other specified noninflammatory disorders of vagina: Secondary | ICD-10-CM | POA: Diagnosis not present

## 2020-06-21 DIAGNOSIS — F900 Attention-deficit hyperactivity disorder, predominantly inattentive type: Secondary | ICD-10-CM | POA: Diagnosis not present

## 2020-06-21 DIAGNOSIS — F330 Major depressive disorder, recurrent, mild: Secondary | ICD-10-CM | POA: Diagnosis not present

## 2020-06-23 DIAGNOSIS — Z87891 Personal history of nicotine dependence: Secondary | ICD-10-CM | POA: Diagnosis not present

## 2020-06-23 DIAGNOSIS — R771 Abnormality of globulin: Secondary | ICD-10-CM | POA: Diagnosis not present

## 2020-06-23 DIAGNOSIS — E782 Mixed hyperlipidemia: Secondary | ICD-10-CM | POA: Diagnosis not present

## 2020-06-23 DIAGNOSIS — D5 Iron 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): Secondary | ICD-10-CM | POA: Diagnosis not present

## 2020-06-23 DIAGNOSIS — N920 Excessive and frequent menstruation with regular cycle: Secondary | ICD-10-CM | POA: Diagnosis not present

## 2020-06-23 DIAGNOSIS — R202 Paresthesia of skin: Secondary | ICD-10-CM | POA: Diagnosis not present

## 2020-06-23 DIAGNOSIS — I10 Essential (primary) hypertension: Secondary | ICD-10-CM | POA: Diagnosis not present

## 2020-07-04 DIAGNOSIS — F330 Major depressive disorder, recurrent, mild: Secondary | ICD-10-CM | POA: Diagnosis not present

## 2020-07-05 ENCOUNTER — Emergency Department (HOSPITAL_COMMUNITY)
Admission: EM | Admit: 2020-07-05 | Discharge: 2020-07-06 | Disposition: A | Discharge status: Home / Self Care | Payer: Medicare Other | Attending: Emergency Medicine | Admitting: Emergency Medicine

## 2020-07-05 ENCOUNTER — Encounter (HOSPITAL_COMMUNITY): Payer: Self-pay | Admitting: Emergency Medicine

## 2020-07-05 ENCOUNTER — Emergency Department (HOSPITAL_COMMUNITY): Payer: Medicare Other

## 2020-07-05 ENCOUNTER — Other Ambulatory Visit: Payer: Self-pay

## 2020-07-05 ENCOUNTER — Ambulatory Visit: Payer: Medicare Other | Admitting: Physical Therapy

## 2020-07-05 DIAGNOSIS — R002 Palpitations: Secondary | ICD-10-CM | POA: Diagnosis not present

## 2020-07-05 DIAGNOSIS — R0602 Shortness of breath: Secondary | ICD-10-CM | POA: Diagnosis not present

## 2020-07-05 DIAGNOSIS — Z79899 Other long term (current) drug therapy: Secondary | ICD-10-CM | POA: Insufficient documentation

## 2020-07-05 DIAGNOSIS — E876 Hypokalemia: Secondary | ICD-10-CM

## 2020-07-05 DIAGNOSIS — I10 Essential (primary) hypertension: Secondary | ICD-10-CM | POA: Diagnosis not present

## 2020-07-05 DIAGNOSIS — Z7982 Long term (current) use of aspirin: Secondary | ICD-10-CM | POA: Insufficient documentation

## 2020-07-05 DIAGNOSIS — R0789 Other chest pain: Secondary | ICD-10-CM

## 2020-07-05 DIAGNOSIS — R079 Chest pain, unspecified: Secondary | ICD-10-CM | POA: Diagnosis not present

## 2020-07-05 LAB — PROTIME-INR
INR: 1 (ref 0.8–1.2)
Prothrombin Time: 12.9 seconds (ref 11.4–15.2)

## 2020-07-05 LAB — I-STAT BETA HCG BLOOD, ED (MC, WL, AP ONLY): I-stat hCG, quantitative: <5 m[IU]/mL (ref <5)

## 2020-07-05 LAB — CBC
HCT: 38.9 % (ref 36.0–46.0)
Hemoglobin: 12.3 g/dL (ref 12.0–15.0)
MCH: 29.1 pg (ref 26.0–34.0)
MCHC: 31.6 g/dL (ref 30.0–36.0)
MCV: 92 fL (ref 80.0–100.0)
Platelets: 586 10*3/uL — ABNORMAL HIGH (ref 150–400)
RBC: 4.23 MIL/uL (ref 3.87–5.11)
RDW: 13 % (ref 11.5–15.5)
WBC: 9.8 10*3/uL (ref 4.0–10.5)
nRBC: 0 % (ref 0.0–0.2)

## 2020-07-05 NOTE — ED Triage Notes (Signed)
Patient arrived with EMS from home reports central chest pain/tightness and palpitations with SOB this evening , denies emesis or diaphoresis , no cough or fever .

## 2020-07-06 DIAGNOSIS — R0789 Other chest pain: Secondary | ICD-10-CM | POA: Diagnosis not present

## 2020-07-06 LAB — BASIC METABOLIC PANEL
Anion gap: 9 (ref 5–15)
BUN: 7 mg/dL (ref 6–20)
CO2: 27 mmol/L (ref 22–32)
Calcium: 9.7 mg/dL (ref 8.9–10.3)
Chloride: 100 mmol/L (ref 98–111)
Creatinine, Ser: 0.64 mg/dL (ref 0.44–1.00)
GFR, Estimated: >60 mL/min (ref >60)
Glucose, Bld: 101 mg/dL — ABNORMAL HIGH (ref 70–99)
Potassium: 3.3 mmol/L — ABNORMAL LOW (ref 3.5–5.1)
Sodium: 136 mmol/L (ref 135–145)

## 2020-07-06 LAB — TROPONIN I (HIGH SENSITIVITY)
Troponin I (High Sensitivity): 3 ng/L (ref <18)
Troponin I (High Sensitivity): 3 ng/L (ref <18)

## 2020-07-06 MED ORDER — POTASSIUM CHLORIDE CRYS ER 20 MEQ PO TBCR
40.0000 meq | EXTENDED_RELEASE_TABLET | Freq: Once | ORAL | Status: AC
  Administered 2020-07-06: 40 meq via ORAL
  Filled 2020-07-06: qty 2

## 2020-07-06 NOTE — ED Provider Notes (Signed)
[facility] EMERGENCY DEPARTMENT Provider Note  CSN: [id number] Arrival date & time: 07/05/20 2211  Chief Complaint(s) Chest Pain  HPI Michelle Bell is a 44 y.o. female    Chest Pain Pain location:  Substernal area Pain quality: tightness   Pain radiates to:  Does not radiate Pain severity:  Mild Onset quality:  Sudden Duration: a few seconds. Timing:  Intermittent Progression:  Waxing and waning Chronicity:  Recurrent Context comment:  While having palpitations and feeling her heart skip Relieved by:  Nothing Worsened by:  Nothing Associated symptoms: palpitations and shortness of breath (mild during the brief episode)   Associated symptoms: no anxiety, no cough, no fever, no headache, no heartburn, no lower extremity edema and no nausea   Risk factors: hypertension   Risk factors: no coronary artery disease, no diabetes mellitus, no high cholesterol, no prior DVT/PE and no smoking     Past Medical History Past Medical History:  Diagnosis Date  . Anemia   . Groin mass    left  . History of blood transfusion   . Hypertension    Patient Active Problem List   Diagnosis Date Noted  . Back pain 06/09/2020  . Neck pain 06/09/2020  . Symptomatic mammary hypertrophy 06/09/2020  . Iron deficiency anemia 10/14/2018   Home Medication(s) Prior to Admission medications   Medication Sig Start Date End Date Taking? Authorizing Provider  acetaminophen (TYLENOL) 325 MG tablet Take 325 mg by mouth daily as needed for moderate pain.   Yes [provider]  aspirin EC 81 MG tablet Take 81 mg by mouth daily.   Yes [provider]  hydrochlorothiazide (HYDRODIURIL) 12.5 MG tablet Take 12.5 mg by mouth daily.   Yes [provider]  ibuprofen (ADVIL,MOTRIN) 200 MG tablet Take 200 mg by mouth daily as needed for headache or moderate pain.   Yes [provider]  Multiple Vitamin (MULTIVITAMIN WITH MINERALS) TABS tablet Take 1  tablet by mouth daily.   Yes [provider]                                                                                                                                    Past Surgical History Past Surgical History:  Procedure Laterality Date  . CESAREAN SECTION    . MASS EXCISION Left 12/23/2016   Procedure: EXCISION LEFT GROIN MASS;  Surgeon: Coralie Keens, MD;  Location: [facility] OR[facility];  Service: General;  Laterality: Left;   Family History Family History  Problem Relation Age of Onset  . Breast cancer Maternal Aunt     Social History Social History   Tobacco Use  . Smoking status: Never Smoker  . Smokeless tobacco: Never Used  Substance Use Topics  . Alcohol use: No  . Drug use: Yes    Types: Marijuana   Allergies Patient has no known allergies.  Review of Systems Review of Systems  Constitutional:  Negative for fever.  Respiratory: Positive for shortness of breath (mild during the brief episode). Negative for cough.   Cardiovascular: Positive for chest pain and palpitations.  Gastrointestinal: Negative for heartburn and nausea.  Neurological: Negative for headaches.   All other systems are reviewed and are negative for acute change except as noted in the HPI  Physical Exam Vital Signs  I have reviewed the triage vital signs BP 123/88 (BP Location: Right Arm)   Pulse 84   Temp 98 F (36.7 C) (Oral)   Resp (!) 21   Ht 5\' 3"  (1.6 m)   Wt 90.3 kg   LMP 06/23/2020   SpO2 100%   BMI 35.25 kg/m   Physical Exam Vitals reviewed.  Constitutional:      General: She is not in acute distress.    Appearance: She is well-developed and overweight. She is not diaphoretic.  HENT:     Head: Normocephalic and atraumatic.     Nose: Nose normal.  Eyes:     General: No scleral icterus.       Right eye: No discharge.        Left eye: No discharge.     Conjunctiva/sclera: Conjunctivae normal.     Pupils: Pupils are equal, round, and reactive to light.    Cardiovascular:     Rate and Rhythm: Normal rate and regular rhythm.     Heart sounds: No murmur heard.  No friction rub. No gallop.   Pulmonary:     Effort: Pulmonary effort is normal. No respiratory distress.     Breath sounds: Normal breath sounds. No stridor. No rales.  Abdominal:     General: There is no distension.     Palpations: Abdomen is soft.     Tenderness: There is no abdominal tenderness.  Musculoskeletal:        General: No tenderness.     Cervical back: Normal range of motion and neck supple.  Skin:    General: Skin is warm and dry.     Findings: No erythema or rash.  Neurological:     Mental Status: She is alert and oriented to person, place, and time.     ED Results and Treatments Labs (all labs ordered are listed, but only abnormal results are displayed) Labs Reviewed  BASIC METABOLIC PANEL - Abnormal; Notable for the following components:      Result Value   Potassium 3.3 (*)    Glucose, Bld 101 (*)    All other components within normal limits  CBC - Abnormal; Notable for the following components:   Platelets 586 (*)    All other components within normal limits  PROTIME-INR  I-STAT BETA HCG BLOOD, ED ([facility], [facility], [facility] ONLY)  TROPONIN I (HIGH SENSITIVITY)  TROPONIN I (HIGH SENSITIVITY)                                                                                                                         EKG  EKG  Interpretation  Date/Time:  Wednesday July 05 2020 22:32:14 EDT Ventricular Rate:  85 PR Interval:  140 QRS Duration: 76 QT Interval:  364 QTC Calculation: 433 R Axis:   4 Text Interpretation: Normal sinus rhythm Cannot rule out Anterior infarct , age undetermined Abnormal ECG No acute changes Confirmed by Addison Lank [phone]) on 07/05/2020 11:57:08 PM      Radiology DG Chest 2 View  Result Date: 07/05/2020 CLINICAL DATA:  Chest pain and shortness of breath EXAM: CHEST - 2 VIEW COMPARISON:  06/11/2020 FINDINGS: Cardiac shadow is  within normal limits. The lungs are clear bilaterally. Elevation of the right hemidiaphragm is again noted and stable. No bony abnormality is seen. IMPRESSION: No active cardiopulmonary disease. Electronically Signed   By: Inez Catalina M.D.   On: 07/05/2020 23:03    Pertinent labs & imaging results that were available during my care of the patient were reviewed by me and considered in my medical decision making (see chart for details).  Medications Ordered in ED Medications  potassium chloride SA (KLOR-CON) CR tablet 40 mEq (has no administration in time range)                                                                                                                                    Procedures Procedures  (including critical care time)  Medical Decision Making / ED Course I have reviewed the nursing notes for this encounter and the patient's prior records (if available in EHR or on provided paperwork).   Michelle Bell was evaluated in Emergency Department on 07/06/2020 for the symptoms described in the history of present illness. She was evaluated in the context of the global COVID-19 pandemic, which necessitated consideration that the patient might be at risk for infection with the SARS-CoV-2 virus that causes COVID-19. Institutional protocols and algorithms that pertain to the evaluation of patients at risk for COVID-19 are in a state of rapid change based on information released by regulatory bodies including the CDC and federal and state organizations. These policies and algorithms were followed during the patient's care in the ED.  Patient presents with chest discomfort related to palpitations. EKG without acute ischemic changes or evidence of pericarditis.  No dysrhythmias or blocks. Similar episodes in the past related to hypokalemia. Patient's potassium here is similar to the dose presentations. Patient is currently asymptomatic.  Low suspicion for ACS.  Patient had  serial troponins drawn during the triage process which were negative.  Do not feel that additional work-up is required.  Chest x-ray without evidence suggestive of pneumonia, pneumothorax, pneumomediastinum.  No abnormal contour of the mediastinum to suggest dissection. No evidence of acute injuries.  Low suspicion for pulmonary embolism.  Patient provided with a single dose of Kdur here.  She is already scheduled to see her primary care provider in the next 48 hours.  Recommended she discuss prolonged course of potassium given her history  of HCTZ use which is likely the source of her hypokalemia.      Final Clinical Impression(s) / ED Diagnoses Final diagnoses:  Palpitation  Hypokalemia  Chest discomfort    The patient appears reasonably screened and/or stabilized for discharge and I doubt any other medical condition or other [facility] requiring further screening, evaluation, or treatment in the ED at this time prior to discharge. Safe for discharge with strict return precautions.  Disposition: Discharge  Condition: Good  I have discussed the results, Dx and Tx plan with the patient/family who expressed understanding and agree(s) with the plan. Discharge instructions discussed at length. The patient/family was given strict return precautions who verbalized understanding of the instructions. No further questions at time of discharge.    ED Discharge Orders    None      Follow Up: Trey Sailors, [place] [address] [place] [id number] [phone]  On 07/07/2020 as scheduled     This chart was dictated using voice recognition software.  Despite best efforts to proofread,  errors can occur which can change the documentation meaning.   Fatima Blank, MD 07/06/20 [phone]

## 2020-07-07 DIAGNOSIS — Z87891 Personal history of nicotine dependence: Secondary | ICD-10-CM | POA: Diagnosis not present

## 2020-07-07 DIAGNOSIS — N920 Excessive and frequent menstruation with regular cycle: Secondary | ICD-10-CM | POA: Diagnosis not present

## 2020-07-07 DIAGNOSIS — I10 Essential (primary) hypertension: Secondary | ICD-10-CM | POA: Diagnosis not present

## 2020-07-07 DIAGNOSIS — R202 Paresthesia of skin: Secondary | ICD-10-CM | POA: Diagnosis not present

## 2020-07-07 DIAGNOSIS — R771 Abnormality of globulin: Secondary | ICD-10-CM | POA: Diagnosis not present

## 2020-07-07 DIAGNOSIS — E782 Mixed hyperlipidemia: Secondary | ICD-10-CM | POA: Diagnosis not present

## 2020-07-07 DIAGNOSIS — D5 Iron 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): Secondary | ICD-10-CM | POA: Diagnosis not present

## 2020-07-19 ENCOUNTER — Other Ambulatory Visit: Payer: Self-pay

## 2020-07-19 ENCOUNTER — Ambulatory Visit: Payer: Medicare Other | Attending: Plastic Surgery

## 2020-07-19 DIAGNOSIS — M62830 Muscle spasm of back: Secondary | ICD-10-CM | POA: Diagnosis not present

## 2020-07-19 DIAGNOSIS — M546 Pain in thoracic spine: Secondary | ICD-10-CM | POA: Diagnosis not present

## 2020-07-19 DIAGNOSIS — N62 Hypertrophy of breast: Secondary | ICD-10-CM | POA: Insufficient documentation

## 2020-07-19 DIAGNOSIS — G8929 Other chronic pain: Secondary | ICD-10-CM | POA: Diagnosis not present

## 2020-07-20 NOTE — Therapy (Signed)
[place], [place], [address] Phone: [phone]   Fax:  [phone]  Physical Therapy Evaluation  Patient Details  Name: Michelle Bell MRN: [id number] Date of Birth: [birth year] Referring Provider (PT): Wallace Going, DO   Encounter Date: 07/19/2020   PT End of Session - 07/20/20 0548    Visit Number 1    Number of Visits 6    Date for PT Re-Evaluation 09/02/20    Authorization Type Dillingham, Loel Lofty, DO    Progress Note Due on Visit 6    PT Start Time 1052    PT Stop Time 1134    PT Time Calculation (min) 42 min    Activity Tolerance Patient tolerated treatment well    Behavior During Therapy [facility] for tasks assessed/performed           Past Medical History:  Diagnosis Date  . Anemia   . Groin mass    left  . History of blood transfusion   . Hypertension     Past Surgical History:  Procedure Laterality Date  . CESAREAN SECTION    . MASS EXCISION Left 12/23/2016   Procedure: EXCISION LEFT GROIN MASS;  Surgeon: Coralie Keens, MD;  Location: [facility];  Service: General;  Laterality: Left;    There were no vitals filed for this visit.    Subjective Assessment - 07/19/20 1104    Subjective Pt reports having an issue with enck, upper shoulder and mid back pain since a teenager.    Limitations Sitting;Reading;Lifting;Standing;Walking;House hold activities    Patient Stated Goals To improve my posture and decrease the pain    Currently in Pain? Yes    Pain Score 5     Pain Location Back    Pain Orientation Mid    Pain Descriptors / Indicators Aching;Tightness    Pain Type Chronic pain    Pain Onset More than a month ago    Pain Frequency Intermittent    Aggravating Factors  Sitting too long    Pain Relieving Factors Hot shower, movement    Multiple Pain Sites Yes    Pain Score 5    Pain Location Shoulder    Pain Orientation Right;Left;Upper    Pain Descriptors /  Indicators Tightness;Aching    Pain Type Chronic pain    Pain Onset More than a month ago    Pain Frequency Intermittent    Aggravating Factors  The bra strap, the heaviness of my breast    Pain Relieving Factors hot shower, movement    Effect of Pain on Daily Activities The pain is always there              [facility] PT Assessment - 07/20/20 0001      Assessment   Medical Diagnosis Pain in thoracic spine; Other chronic pain    Referring Provider (PT) Dillingham, Loel Lofty, DO    Hand Dominance Right    Prior Therapy No       Precautions   Precautions None      Restrictions   Weight Bearing Restrictions No      Balance Screen   Has the patient fallen in the past 6 months No      [place] residence    Living Arrangements Alone    Type of [place] to enter    Entrance Stairs-Number of Steps 14    Entrance Stairs-Rails Can  reach both    Home Layout One level      Prior Function   Level of Independence Independent    Vocation On disability      Cognition   Overall Cognitive Status Within Functional Limits for tasks assessed      Sensation   Light Touch Appears Intact      Posture/Postural Control   Posture/Postural Control Postural limitations    Postural Limitations Rounded Shoulders;Forward head      ROM / Strength   AROM / PROM / Strength AROM;Strength      AROM   Overall AROM Comments Shoulder strength is WNLs      Palpation   Palpation comment TTP to the upper traps, upper to mid back paraspinals, and rhomboids      Transfers   Transfers Sit to Stand;Stand to Sit    Sit to Stand 7: Independent      Ambulation/Gait   Gait Pattern Within Functional Limits;Step-to pattern                      Objective measurements completed on examination: See above findings.               PT Education - 07/20/20 0546    Education Details Eval findings, POC, HEP for posture,  flexibility and strengthening    Person(s) Educated Patient    Methods Explanation;Demonstration;Tactile cues;Verbal cues;Handout    Comprehension Verbalized understanding;Returned demonstration;Verbal cues required;Tactile cues required;Need further instruction            PT Short Term Goals - 07/20/20 0601      PT SHORT TERM GOAL #1   Title Pt will be ind in an initial HEP    Status New    Target Date 08/10/20      PT SHORT TERM GOAL #2   Title Pt will voice understanding of measures for pain reduction and management    Status New    Target Date 08/03/20             PT Long Term Goals - 07/20/20 0603      PT LONG TERM GOAL #1   Title Pt will be able to demonstrate proper sitting posture    Status New    Target Date 09/01/20      PT LONG TERM GOAL #2   Title Pt will report decreased pain of 3/10 or less with daily activities    Status New    Target Date 09/01/20      PT LONG TERM GOAL #3   Title Pt will be Ind in a final HEP to maintain or progress achieved LOF    Status New    Target Date 09/01/20                  Plan - 07/20/20 0550    Clinical Impression Statement Pt presents to PT with neck, upper shoulder and mid back pain related to postural changes and musculoskeletal strain associated with mammory hypertrophy. Pt will benefit from PT 1w5 for postural ed, an exercise program for flexibility and strengthening, and modalities to reduce pain with daily activities    Personal Factors and Comorbidities Time since onset of injury/illness/exacerbation;Past/Current Experience;Comorbidity 1    Comorbidities Obesity    Examination-Activity Limitations Lift;Reach Overhead;Sit;Sleep;Dressing;Bathing    Stability/Clinical Decision Making Stable/Uncomplicated    Clinical Decision Making Low    Rehab Potential Fair    PT Frequency 1x / week    PT Duration --  5 weeks   PT Treatment/Interventions ADLs/Self Care Home Management;Cryotherapy;Electrical  Stimulation;Iontophoresis 4mg /ml Dexamethasone;Moist Heat;Ultrasound;Neuromuscular re-education;Therapeutic exercise;Therapeutic activities;Patient/family education;Manual techniques;Dry needling;Taping;Vasopneumatic Device    PT Next Visit Plan Assess reponse to HEP. Progress ther ex/HEP and use modalities as indicated.    PT Home Exercise Plan [id number]-for posture, flexibility and posterior chain strengthening    Consulted and Agree with Plan of Care Patient           Patient will benefit from skilled therapeutic intervention in order to improve the following deficits and impairments:  Decreased strength, Postural dysfunction, Improper body mechanics, Pain, Obesity, Increased muscle spasms  Visit Diagnosis: Other chronic pain  Pain in thoracic spine  Symptomatic mammary hypertrophy  Muscle spasm of back     Problem List Patient Active Problem List   Diagnosis Date Noted  . Back pain 06/09/2020  . Neck pain 06/09/2020  . Symptomatic mammary hypertrophy 06/09/2020  . Iron deficiency anemia 10/14/2018    Gar Ponto MS, PT 07/20/20 6:18 AM  [place] [facility] [address] [place], [place], [address] Phone: [phone]   Fax:  [phone]  Name: Michelle Bell MRN: [id number] Date of Birth: 10-Dec-1975

## 2020-07-24 DIAGNOSIS — F330 Major depressive disorder, recurrent, mild: Secondary | ICD-10-CM | POA: Diagnosis not present

## 2020-07-24 DIAGNOSIS — F900 Attention-deficit hyperactivity disorder, predominantly inattentive type: Secondary | ICD-10-CM | POA: Diagnosis not present

## 2020-07-26 ENCOUNTER — Ambulatory Visit: Payer: Medicare Other | Admitting: Physical Therapy

## 2020-08-01 ENCOUNTER — Ambulatory Visit: Payer: Medicare Other

## 2020-08-01 ENCOUNTER — Other Ambulatory Visit: Payer: Self-pay

## 2020-08-01 DIAGNOSIS — G8929 Other chronic pain: Secondary | ICD-10-CM | POA: Diagnosis not present

## 2020-08-01 DIAGNOSIS — N62 Hypertrophy of breast: Secondary | ICD-10-CM

## 2020-08-01 DIAGNOSIS — M546 Pain in thoracic spine: Secondary | ICD-10-CM

## 2020-08-01 DIAGNOSIS — M62830 Muscle spasm of back: Secondary | ICD-10-CM | POA: Diagnosis not present

## 2020-08-01 NOTE — Therapy (Signed)
Michelle Bell, [place], [address] Phone: [phone]   Fax:  [phone]  Physical Therapy Treatment  Patient Details  Name: Michelle Bell MRN: [id number] Date of Birth: [birth year] Referring Provider (PT): Wallace Going, DO   Encounter Date: 08/01/2020   PT End of Session - 08/01/20 6160    Visit Number 2    Number of Visits 6    Date for PT Re-Evaluation 09/02/20    Authorization Type Dillingham, Loel Lofty, DO    Progress Note Due on Visit 6    PT Start Time 0804    PT Stop Time 0845    PT Time Calculation (min) 41 min    Activity Tolerance Patient tolerated treatment well    Behavior During Therapy [facility] for tasks assessed/performed           Past Medical History:  Diagnosis Date  . Anemia   . Groin mass    left  . History of blood transfusion   . Hypertension     Past Surgical History:  Procedure Laterality Date  . CESAREAN SECTION    . MASS EXCISION Left 12/23/2016   Procedure: EXCISION LEFT GROIN MASS;  Surgeon: Coralie Keens, MD;  Location: [facility];  Service: General;  Laterality: Left;    There were no vitals filed for this visit.   Subjective Assessment - 08/01/20 0810    Subjective Pt reports she has been performing her HEP every other day. She feels it's helping with pain some, and relief lasts for an hour or two.    Limitations Sitting;Reading;Lifting;Standing;Walking;House hold activities    Patient Stated Goals To improve my posture and decrease the pain    Currently in Pain? Yes    Pain Score 9     Pain Location Back    Pain Orientation Left;Mid    Pain Descriptors / Indicators Aching;Tightness    Pain Type Chronic pain    Pain Onset More than a month ago    Pain Onset More than a month ago                             [facility] Adult PT Treatment/Exercise - 08/01/20 0001      Exercises   Exercises Neck;Shoulder;Lumbar      Neck Exercises:  Machines for Strengthening   UBE (Upper Arm Bike) 6' L2 (3 fwd/3 bkwd)      Neck Exercises: Seated   Neck Retraction 10 reps;3 secs    Other Seated Exercise T/S ext over chair c HBH x 15    Other Seated Exercise UT S 2 x 20"      Shoulder Exercises: Sidelying   Other Sidelying Exercises Open books 2 x 10      Shoulder Exercises: Standing   Row PROM;Strengthening;12 reps    Theraband Level (Shoulder Row) Level 3 (Green)      Shoulder Exercises: Therapy Ball   [place] Limitations      Other Therapy Ball Exercises seated child's pose 3 way 3 x10" ea      Shoulder Exercises: Stretch   Other Shoulder Stretches 90/90 pec S at doorway                    PT Short Term Goals - 07/20/20 0601      PT SHORT TERM GOAL #1   Title Pt will be ind in an initial HEP    Status  New    Target Date 08/10/20      PT SHORT TERM GOAL #2   Title Pt will voice understanding of measures for pain reduction and management    Status New    Target Date 08/03/20             PT Long Term Goals - 07/20/20 0603      PT LONG TERM GOAL #1   Title Pt will be able to demonstrate proper sitting posture    Status New    Target Date 09/01/20      PT LONG TERM GOAL #2   Title Pt will report decreased pain of 3/10 or less with daily activities    Status New    Target Date 09/01/20      PT LONG TERM GOAL #3   Title Pt will be Ind in a final HEP to maintain or progress achieved LOF    Status New    Target Date 09/01/20                 Plan - 08/01/20 0539    Clinical Impression Statement Pt has been semi diligent to HEP since her initial evaluation last week. She was educated importance of completing HEP more frequently, particularly since it provides her with temporary relief. Pt responded well to increased thoracic and lower cervical extension today (added to HEP), reporting relief instantly with seated extension over chair. Pt has significant difficulty performing shoulder  depression and retraction, likely due to increased thoracic kyphosis. Reviewed form for rows and advised to focus on pulling shoulders back and lifting chest.    Personal Factors and Comorbidities Time since onset of injury/illness/exacerbation;Past/Current Experience;Comorbidity 1    Comorbidities Obesity    Examination-Activity Limitations Lift;Reach Overhead;Sit;Sleep;Dressing;Bathing    Stability/Clinical Decision Making Stable/Uncomplicated    Rehab Potential Fair    PT Frequency 1x / week    PT Duration --   5 weeks   PT Treatment/Interventions ADLs/Self Care Home Management;Cryotherapy;Electrical Stimulation;Iontophoresis 4mg /ml Dexamethasone;Moist Heat;Ultrasound;Neuromuscular re-education;Therapeutic exercise;Therapeutic activities;Patient/family education;Manual techniques;Dry needling;Taping;Vasopneumatic Device    PT Next Visit Plan Assess reponse to HEP. Progress ther ex/HEP and use modalities as indicated.    PT Home Exercise Plan [id number]-for posture, flexibility and posterior chain strengthening    Consulted and Agree with Plan of Care Patient           Patient will benefit from skilled therapeutic intervention in order to improve the following deficits and impairments:  Decreased strength, Postural dysfunction, Improper body mechanics, Pain, Obesity, Increased muscle spasms  Visit Diagnosis: Other chronic pain  Pain in thoracic spine  Symptomatic mammary hypertrophy  Muscle spasm of back     Problem List Patient Active Problem List   Diagnosis Date Noted  . Back pain 06/09/2020  . Neck pain 06/09/2020  . Symptomatic mammary hypertrophy 06/09/2020  . Iron deficiency anemia 10/14/2018    Izell [place], PT, DPT 08/01/2020, 9:52 AM  [facility] [address] [place], [place], [address] Phone: [phone]   Fax:  [phone]  Name: Michelle Bell MRN: [id number] Date of Birth: [birth year]

## 2020-08-04 DIAGNOSIS — E782 Mixed hyperlipidemia: Secondary | ICD-10-CM | POA: Diagnosis not present

## 2020-08-04 DIAGNOSIS — R202 Paresthesia of skin: Secondary | ICD-10-CM | POA: Diagnosis not present

## 2020-08-04 DIAGNOSIS — R771 Abnormality of globulin: Secondary | ICD-10-CM | POA: Diagnosis not present

## 2020-08-04 DIAGNOSIS — Z23 Encounter for immunization: Secondary | ICD-10-CM | POA: Diagnosis not present

## 2020-08-04 DIAGNOSIS — Z87891 Personal history of nicotine dependence: Secondary | ICD-10-CM | POA: Diagnosis not present

## 2020-08-04 DIAGNOSIS — N920 Excessive and frequent menstruation with regular cycle: Secondary | ICD-10-CM | POA: Diagnosis not present

## 2020-08-04 DIAGNOSIS — D5 Iron 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): Secondary | ICD-10-CM | POA: Diagnosis not present

## 2020-08-04 DIAGNOSIS — I10 Essential (primary) hypertension: Secondary | ICD-10-CM | POA: Diagnosis not present

## 2020-08-04 DIAGNOSIS — Z8619 Personal history of other infectious and parasitic diseases: Secondary | ICD-10-CM | POA: Diagnosis not present

## 2020-08-10 ENCOUNTER — Ambulatory Visit: Payer: Medicare Other | Attending: Plastic Surgery

## 2020-08-10 ENCOUNTER — Other Ambulatory Visit: Payer: Self-pay

## 2020-08-10 DIAGNOSIS — G8929 Other chronic pain: Secondary | ICD-10-CM | POA: Diagnosis not present

## 2020-08-10 DIAGNOSIS — N62 Hypertrophy of breast: Secondary | ICD-10-CM | POA: Insufficient documentation

## 2020-08-10 DIAGNOSIS — M546 Pain in thoracic spine: Secondary | ICD-10-CM

## 2020-08-10 DIAGNOSIS — M62830 Muscle spasm of back: Secondary | ICD-10-CM | POA: Insufficient documentation

## 2020-08-10 NOTE — Therapy (Signed)
[place], [place], [address] Phone: [phone]   Fax:  [phone]  Physical Therapy Treatment  Patient Details  Name: Michelle Bell MRN: [id number] Date of Birth: Mar 15, 1976 Referring Provider (PT): Wallace Going, DO   Encounter Date: 08/10/2020   PT End of Session - 08/10/20 0819    Visit Number 3    Number of Visits 6    Date for PT Re-Evaluation 09/02/20    Authorization Type Dillingham, Loel Lofty, DO    Progress Note Due on Visit 6    PT Start Time 0815   pt arrived late via transportation   PT Stop Time 0845    PT Time Calculation (min) 30 min    Activity Tolerance Patient tolerated treatment well    Behavior During Therapy [facility] for tasks assessed/performed           Past Medical History:  Diagnosis Date  . Anemia   . Groin mass    left  . History of blood transfusion   . Hypertension     Past Surgical History:  Procedure Laterality Date  . CESAREAN SECTION    . MASS EXCISION Left 12/23/2016   Procedure: EXCISION LEFT GROIN MASS;  Surgeon: Coralie Keens, MD;  Location: [facility];  Service: General;  Laterality: Left;    There were no vitals filed for this visit.   Subjective Assessment - 08/10/20 0820    Subjective Pt reports transportation picked her up at 7 something, but had to pick up others, hence arriving late. She continues to feel a little bit of pulling neck/back/shoulders, but not like it was.    Limitations Sitting;Reading;Lifting;Standing;Walking;House hold activities    Patient Stated Goals To improve my posture and decrease the pain    Pain Score 3     Pain Location Back    Pain Orientation Lower;Mid    Pain Descriptors / Indicators Sore    Pain Onset More than a month ago    Pain Frequency Intermittent    Pain Onset More than a month ago                             [facility] Adult PT Treatment/Exercise - 08/10/20 0001      Self-Care    Self-Care Posture    Posture awareness throughout the day pulling shoulders down and back      Neck Exercises: Machines for Strengthening   UBE (Upper Arm Bike) 6' L2 (2 fwd/2 bkwd)      Neck Exercises: Seated   Neck Retraction 10 reps;3 secs    Other Seated Exercise T/S ext over chair c HBH x 15    Other Seated Exercise UT S 2 x 20"      Shoulder Exercises: Standing   Extension Strengthening;Both;12 reps;Theraband    Theraband Level (Shoulder Extension) Level 3 (Green)    Extension Limitations cues for chest up and shoulders down/back   incr R UT compensation   Row Strengthening;12 reps;Both   2 sets   Row Weight (lbs) TRX    Row Limitations verbal/tactile cues for decr UT activation R>L                  PT Education - 08/10/20 0900    Education Details Continuing to focus on posture throughout the day, attempting shoulder extension with band over doorway, but to let PT know if she needs a longer band  Person(s) Educated Patient    Methods Explanation;Demonstration;Verbal cues;Tactile cues    Comprehension Verbalized understanding            PT Short Term Goals - 08/10/20 0902      PT SHORT TERM GOAL #1   Title Pt will be ind in an initial HEP    Status Achieved      PT SHORT TERM GOAL #2   Title Pt will voice understanding of measures for pain reduction and management    Status On-going             PT Long Term Goals - 08/10/20 0902      PT LONG TERM GOAL #1   Title Pt will be able to demonstrate proper sitting posture    Status On-going      PT LONG TERM GOAL #2   Title Pt will report decreased pain of 3/10 or less with daily activities    Baseline today was 3-4/10    Status On-going      PT LONG TERM GOAL #3   Title Pt will be Ind in a final HEP to maintain or progress achieved LOF    Status On-going                 Plan - 08/10/20 0820    Clinical Impression Statement Pt presents with significant decrease in pain today after  over a week since her last appointment. She demonstrated good carryover with form for chin tucks and rows. She continues to require tactile and verbal cuing to reduce UT compensation R>T, but was able to perform TRX rows and standing extension with better form and engagement than last visit.    Personal Factors and Comorbidities Time since onset of injury/illness/exacerbation;Past/Current Experience;Comorbidity 1    Comorbidities Obesity    Examination-Activity Limitations Lift;Reach Overhead;Sit;Sleep;Dressing;Bathing    Stability/Clinical Decision Making Stable/Uncomplicated    Rehab Potential Fair    PT Frequency 1x / week    PT Duration --   5 weeks   PT Treatment/Interventions ADLs/Self Care Home Management;Cryotherapy;Electrical Stimulation;Iontophoresis 4mg /ml Dexamethasone;Moist Heat;Ultrasound;Neuromuscular re-education;Therapeutic exercise;Therapeutic activities;Patient/family education;Manual techniques;Dry needling;Taping;Vasopneumatic Device    PT Next Visit Plan Assess reponse to HEP and update PRN. Progress ther ex/HEP and use modalities as indicated.    PT Home Exercise Plan [id number]-for posture, flexibility and posterior chain strengthening    Consulted and Agree with Plan of Care Patient           Patient will benefit from skilled therapeutic intervention in order to improve the following deficits and impairments:  Decreased strength,Postural dysfunction,Improper body mechanics,Pain,Obesity,Increased muscle spasms  Visit Diagnosis: Other chronic pain  Pain in thoracic spine  Symptomatic mammary hypertrophy  Muscle spasm of back     Problem List Patient Active Problem List   Diagnosis Date Noted  . Back pain 06/09/2020  . Neck pain 06/09/2020  . Symptomatic mammary hypertrophy 06/09/2020  . Iron deficiency anemia 10/14/2018    Michelle Bell, PT, DPT 08/10/2020, 9:05 AM  [facility] [address] [place], [place], [address] Phone: [phone]   Fax:  [phone]  Name: Michelle Bell MRN: [id number] Date of Birth: 03-Sep-1975

## 2020-08-11 DIAGNOSIS — F330 Major depressive disorder, recurrent, mild: Secondary | ICD-10-CM | POA: Diagnosis not present

## 2020-08-17 ENCOUNTER — Ambulatory Visit: Payer: Medicare Other

## 2020-08-17 ENCOUNTER — Other Ambulatory Visit: Payer: Self-pay

## 2020-08-17 DIAGNOSIS — G8929 Other chronic pain: Secondary | ICD-10-CM | POA: Diagnosis not present

## 2020-08-17 DIAGNOSIS — N62 Hypertrophy of breast: Secondary | ICD-10-CM | POA: Diagnosis not present

## 2020-08-17 DIAGNOSIS — M546 Pain in thoracic spine: Secondary | ICD-10-CM | POA: Diagnosis not present

## 2020-08-17 DIAGNOSIS — M62830 Muscle spasm of back: Secondary | ICD-10-CM

## 2020-08-17 NOTE — Therapy (Signed)
[place], [place], [address] Phone: [phone]   Fax:  [phone]  Physical Therapy Treatment  Patient Details  Name: Angellina Ferdinand MRN: [id number] Date of Birth: 1976-08-07 Referring Provider (PT): Wallace Going, DO   Encounter Date: 08/17/2020   PT End of Session - 08/17/20 0807    Visit Number 4    Number of Visits 6    Date for PT Re-Evaluation 09/02/20    Authorization Type Dillingham, Loel Lofty, DO    Progress Note Due on Visit 6    Activity Tolerance Patient tolerated treatment well    Behavior During Therapy [facility] for tasks assessed/performed           Past Medical History:  Diagnosis Date  . Anemia   . Groin mass    left  . History of blood transfusion   . Hypertension     Past Surgical History:  Procedure Laterality Date  . CESAREAN SECTION    . MASS EXCISION Left 12/23/2016   Procedure: EXCISION LEFT GROIN MASS;  Surgeon: Coralie Keens, MD;  Location: [facility];  Service: General;  Laterality: Left;    There were no vitals filed for this visit.   Subjective Assessment - 08/17/20 0805    Subjective Pt reports she is having some right lower side/back pain that she is not sure whether it is menstrual related or holding her baby nephew on that side prolonged.    Limitations Sitting;Reading;Lifting;Standing;Walking;House hold activities    Patient Stated Goals To improve my posture and decrease the pain    Currently in Pain? Yes    Pain Score 8     Pain Location Back    Pain Orientation Right;Lower    Pain Descriptors / Indicators Sore    Pain Onset More than a month ago    Pain Onset More than a month ago                             [facility] Adult PT Treatment/Exercise - 08/17/20 0001      Self-Care   Self-Care Heat/Ice Application;Other Self-Care Comments    Posture Heat as needed for R low back/side pain, gentle and repetitive stretching to  ease muscle soreness      Neck Exercises: Machines for Strengthening   UBE (Upper Arm Bike) 6' L2 (3 fwd/3 bkwd)      Neck Exercises: Seated   Neck Retraction 10 reps;3 secs    Other Seated Exercise T/S ext over chair c HBH x 15      Lumbar Exercises: Stretches   Single Knee to Chest Stretch 2 reps;20 seconds    Single Knee to Chest Stretch Limitations 3x R, 2x L    Lower Trunk Rotation 3 reps;20 seconds    Lower Trunk Rotation Limitations 3x R, 2x L      Lumbar Exercises: Supine   Pelvic Tilt 10 reps;5 seconds    Pelvic Tilt Limitations exhale to draw in, inhale to let go    Bridge 10 reps;3 seconds    Bridge Limitations exhale to perform PPT and hold to lift into bridge      Shoulder Exercises: Seated   Retraction Both;5 reps      Shoulder Exercises: Sidelying   Other Sidelying Exercises Open books 2 x 10                    PT Short Term  Goals - 08/10/20 0902      PT SHORT TERM GOAL #1   Title Pt will be ind in an initial HEP    Status Achieved      PT SHORT TERM GOAL #2   Title Pt will voice understanding of measures for pain reduction and management    Status On-going             PT Long Term Goals - 08/10/20 0902      PT LONG TERM GOAL #1   Title Pt will be able to demonstrate proper sitting posture    Status On-going      PT LONG TERM GOAL #2   Title Pt will report decreased pain of 3/10 or less with daily activities    Baseline today was 3-4/10    Status On-going      PT LONG TERM GOAL #3   Title Pt will be Ind in a final HEP to maintain or progress achieved LOF    Status On-going                 Plan - 08/17/20 0807    Clinical Impression Statement Pt presents with high level of right lower back/side pain today, potentially from holding her 56 year old nephew for too long. Symptoms are similar to an overused muscle wth soreness as main complaint. Focused session on gradually stretching and unwinding right side with report of some  relief following visit. Will ramp up again next session with more postural focused strengthening, as patient tolerates.    Personal Factors and Comorbidities Time since onset of injury/illness/exacerbation;Past/Current Experience;Comorbidity 1    Comorbidities Obesity    Examination-Activity Limitations Lift;Reach Overhead;Sit;Sleep;Dressing;Bathing    Stability/Clinical Decision Making Stable/Uncomplicated    Rehab Potential Fair    PT Frequency 1x / week    PT Duration --   5 weeks   PT Treatment/Interventions ADLs/Self Care Home Management;Cryotherapy;Electrical Stimulation;Iontophoresis 4mg /ml Dexamethasone;Moist Heat;Ultrasound;Neuromuscular re-education;Therapeutic exercise;Therapeutic activities;Patient/family education;Manual techniques;Dry needling;Taping;Vasopneumatic Device    PT Next Visit Plan Assess reponse to HEP and update PRN. Progress ther ex/HEP and use modalities as indicated.    PT Home Exercise Plan [id number]-for posture, flexibility and posterior chain strengthening    Consulted and Agree with Plan of Care Patient           Patient will benefit from skilled therapeutic intervention in order to improve the following deficits and impairments:  Decreased strength,Postural dysfunction,Improper body mechanics,Pain,Obesity,Increased muscle spasms  Visit Diagnosis: Other chronic pain  Pain in thoracic spine  Symptomatic mammary hypertrophy  Muscle spasm of back     Problem List Patient Active Problem List   Diagnosis Date Noted  . Back pain 06/09/2020  . Neck pain 06/09/2020  . Symptomatic mammary hypertrophy 06/09/2020  . Iron deficiency anemia 10/14/2018    Izell [place], PT, DPT 08/17/2020, 8:52 AM  [facility] [address] [place], [place], [address] Phone: [phone]   Fax:  [phone]  Name: Zienna Ahlin MRN: [id number] Date of Birth: 05-25-1976

## 2020-08-22 ENCOUNTER — Encounter: Payer: Self-pay | Admitting: Physical Therapy

## 2020-08-22 ENCOUNTER — Other Ambulatory Visit: Payer: Self-pay

## 2020-08-22 ENCOUNTER — Ambulatory Visit: Payer: Medicare Other | Admitting: Physical Therapy

## 2020-08-22 DIAGNOSIS — G8929 Other chronic pain: Secondary | ICD-10-CM

## 2020-08-22 DIAGNOSIS — M546 Pain in thoracic spine: Secondary | ICD-10-CM

## 2020-08-22 DIAGNOSIS — M62830 Muscle spasm of back: Secondary | ICD-10-CM

## 2020-08-22 DIAGNOSIS — N62 Hypertrophy of breast: Secondary | ICD-10-CM | POA: Diagnosis not present

## 2020-08-22 NOTE — Therapy (Signed)
[place], [place], [address] Phone: [phone]   Fax:  [phone]  Physical Therapy Treatment  Patient Details  Name: Michelle Bell MRN: [id number] Date of Birth: 12-03-1975 Referring Provider (PT): Wallace Going, DO   Encounter Date: 08/22/2020   PT End of Session - 08/22/20 1117    Visit Number 5    Number of Visits 6    Date for PT Re-Evaluation 09/02/20    Authorization Type Dillingham, Loel Lofty, DO    PT Start Time 1115    PT Stop Time 1154    PT Time Calculation (min) 39 min    Activity Tolerance Patient tolerated treatment well    Behavior During Therapy [facility] for tasks assessed/performed           Past Medical History:  Diagnosis Date  . Anemia   . Groin mass    left  . History of blood transfusion   . Hypertension     Past Surgical History:  Procedure Laterality Date  . CESAREAN SECTION    . MASS EXCISION Left 12/23/2016   Procedure: EXCISION LEFT GROIN MASS;  Surgeon: Coralie Keens, MD;  Location: [facility];  Service: General;  Laterality: Left;    There were no vitals filed for this visit.   Subjective Assessment - 08/22/20 1119    Subjective LBP is about the same today. Bra straps are still creating dent in shoulders.    Patient Stated Goals To improve my posture and decrease the pain    Currently in Pain? Yes    Pain Score 4     Pain Location Back    Pain Orientation Upper    Pain Descriptors / Indicators Sore    Aggravating Factors  still postures    Pain Relieving Factors movement                             [facility] Adult PT Treatment/Exercise - 08/22/20 0001      Neck Exercises: Standing   Other Standing Exercises row blue, extension green      Lumbar Exercises: Stretches   Other Lumbar Stretch Exercise L stretch at counter      Lumbar Exercises: Supine   AB Set Limitations ab set with breathing    Dead Bug Limitations dead bug  extension    Other Supine Lumbar Exercises marching with ab set, leg ext with ab set, TT holds                  PT Education - 08/22/20 1126    Education Details post op    Person(s) Educated Patient    Methods Explanation    Comprehension Verbalized understanding;Need further instruction            PT Short Term Goals - 08/10/20 0902      PT SHORT TERM GOAL #1   Title Pt will be ind in an initial HEP    Status Achieved      PT SHORT TERM GOAL #2   Title Pt will voice understanding of measures for pain reduction and management    Status On-going             PT Long Term Goals - 08/10/20 0902      PT LONG TERM GOAL #1   Title Pt will be able to demonstrate proper sitting posture    Status On-going  PT LONG TERM GOAL #2   Title Pt will report decreased pain of 3/10 or less with daily activities    Baseline today was 3-4/10    Status On-going      PT LONG TERM GOAL #3   Title Pt will be Ind in a final HEP to maintain or progress achieved LOF    Status On-going                 Plan - 08/22/20 1149    Clinical Impression Statement We discussed post op preparations today to prepare her and she was encouraged to ask any other questions. Added core strengthening to HEP for postural support.    PT Treatment/Interventions ADLs/Self Care Home Management;Cryotherapy;Electrical Stimulation;Iontophoresis 4mg /ml Dexamethasone;Moist Heat;Ultrasound;Neuromuscular re-education;Therapeutic exercise;Therapeutic activities;Patient/family education;Manual techniques;Dry needling;Taping;Vasopneumatic Device    PT Next Visit Plan last visit required, review HEP, advance as needed and d/c    PT Home Exercise Plan [id number]-for posture, flexibility and posterior chain strengthening, [id number] for core    Consulted and Agree with Plan of Care Patient           Patient will benefit from skilled therapeutic intervention in order to improve the following deficits and  impairments:  Decreased strength,Postural dysfunction,Improper body mechanics,Pain,Obesity,Increased muscle spasms  Visit Diagnosis: Other chronic pain  Pain in thoracic spine  Symptomatic mammary hypertrophy  Muscle spasm of back     Problem List Patient Active Problem List   Diagnosis Date Noted  . Back pain 06/09/2020  . Neck pain 06/09/2020  . Symptomatic mammary hypertrophy 06/09/2020  . Iron deficiency anemia 10/14/2018    Meliss Fleek C. Sanaai Doane PT, DPT 08/22/20 12:02 PM   [place], [place], [address] Phone: [phone]   Fax:  [phone]  Name: Michelle Bell MRN: [id number] Date of Birth: 1976-03-07

## 2020-08-24 DIAGNOSIS — I10 Essential (primary) hypertension: Secondary | ICD-10-CM | POA: Diagnosis not present

## 2020-09-05 ENCOUNTER — Ambulatory Visit: Payer: Medicare Other | Attending: Plastic Surgery

## 2020-09-05 ENCOUNTER — Other Ambulatory Visit: Payer: Self-pay

## 2020-09-05 DIAGNOSIS — M546 Pain in thoracic spine: Secondary | ICD-10-CM | POA: Insufficient documentation

## 2020-09-05 DIAGNOSIS — N62 Hypertrophy of breast: Secondary | ICD-10-CM | POA: Insufficient documentation

## 2020-09-05 DIAGNOSIS — M62830 Muscle spasm of back: Secondary | ICD-10-CM | POA: Insufficient documentation

## 2020-09-05 DIAGNOSIS — G8929 Other chronic pain: Secondary | ICD-10-CM | POA: Insufficient documentation

## 2020-09-05 NOTE — Therapy (Signed)
[place], [place], [address] Phone: [phone]   Fax:  [phone]  Physical Therapy Discharge  Patient Details  Name: Michelle Bell MRN: [id number] Date of Birth: Jun 05, 1976 Referring Provider (PT): Wallace Going, DO   Encounter Date: 09/05/2020   PT End of Session - 09/05/20 0914    Visit Number 6    Number of Visits 6    Date for PT Re-Evaluation 09/02/20    Authorization Type Dillingham, Loel Lofty, DO    PT Start Time 0800    PT Stop Time 0840    PT Time Calculation (min) 40 min    Activity Tolerance Patient tolerated treatment well    Behavior During Therapy [facility] for tasks assessed/performed           Past Medical History:  Diagnosis Date  . Anemia   . Groin mass    left  . History of blood transfusion   . Hypertension     Past Surgical History:  Procedure Laterality Date  . CESAREAN SECTION    . MASS EXCISION Left 12/23/2016   Procedure: EXCISION LEFT GROIN MASS;  Surgeon: Coralie Keens, MD;  Location: [facility];  Service: General;  Laterality: Left;    There were no vitals filed for this visit.   Subjective Assessment - 09/05/20 0909    Subjective Pt reports some mid LBP pain today. She has all of her photos from her HEP and knows to call her MD when she is done with PT today.    Patient Stated Goals To improve my posture and decrease the pain    Currently in Pain? Yes    Pain Score 4     Pain Location Back    Pain Orientation Upper;Mid;Lower    Pain Descriptors / Indicators Aching    Pain Type Chronic pain    Pain Onset More than a month ago    Pain Frequency Intermittent    Aggravating Factors  still postures    Pain Relieving Factors movement                             [facility] Adult PT Treatment/Exercise - 09/05/20 0001      Self-Care   Self-Care Other Self-Care Comments    Posture reviewed HEP, posture      Neck Exercises: Seated    Neck Retraction 10 reps;5 secs    Other Seated Exercise T/S ext over chair c HBH x 15      Lumbar Exercises: Supine   Dead Bug Limitations dead bug extension      Shoulder Exercises: Sidelying   Other Sidelying Exercises Open books 2 x 10      Shoulder Exercises: Standing   Extension Strengthening;Both;12 reps;Theraband    Theraband Level (Shoulder Extension) Level 3 (Green)    Extension Limitations cues for chest up and shoulders down/back   incr R UT compensation   Row Strengthening;12 reps;Both   2 sets   Theraband Level (Shoulder Row) Level 3 (Green)    Row Limitations verbal/tactile cues for decr UT activation R>L   high and mid                 PT Education - 09/05/20 0914    Education Details post op, call MD, review HEP    Person(s) Educated Patient    Methods Explanation    Comprehension Verbalized understanding  PT Short Term Goals - 09/05/20 0915      PT SHORT TERM GOAL #1   Title Pt will be ind in an initial HEP    Status Achieved      PT SHORT TERM GOAL #2   Title Pt will voice understanding of measures for pain reduction and management    Status Achieved             PT Long Term Goals - 09/05/20 0916      PT LONG TERM GOAL #1   Title Pt will be able to demonstrate proper sitting posture    Baseline intermittent    Status Partially Met      PT LONG TERM GOAL #2   Title Pt will report decreased pain of 3/10 or less with daily activities    Baseline 4/10 usually    Status Partially Met      PT LONG TERM GOAL #3   Title Pt will be Ind in a final HEP to maintain or progress achieved LOF    Baseline continued to need a mod-max verbal/tactile cues for optimal form with rowing and pull downs    Status Partially Met                 Plan - 09/05/20 0915    Clinical Impression Statement Pt encouraged to call MD post visit to follow-up and let know PT is complete. Pt has had some success with pain relief from physical therapy,  but continues to need mod-max verbal cues for appropriate shoulder and spinal positioning for pulling exercises without compensation from anterior shoulder/chest musculature and shoulder elevators.    PT Treatment/Interventions ADLs/Self Care Home Management;Cryotherapy;Electrical Stimulation;Iontophoresis 27m/ml Dexamethasone;Moist Heat;Ultrasound;Neuromuscular re-education;Therapeutic exercise;Therapeutic activities;Patient/family education;Manual techniques;Dry needling;Taping;Vasopneumatic Device    PT Next Visit Plan DC with HEP    PT Home Exercise Plan [id number]-for posture, flexibility and posterior chain strengthening, [id number] core    Consulted and Agree with Plan of Care Patient           Patient will benefit from skilled therapeutic intervention in order to improve the following deficits and impairments:  Decreased strength,Postural dysfunction,Improper body mechanics,Pain,Obesity,Increased muscle spasms  Visit Diagnosis: Other chronic pain  Pain in thoracic spine  Symptomatic mammary hypertrophy  Muscle spasm of back     Problem List Patient Active Problem List   Diagnosis Date Noted  . Back pain 06/09/2020  . Neck pain 06/09/2020  . Symptomatic mammary hypertrophy 06/09/2020  . Iron deficiency anemia 10/14/2018    KIzell Leland Grove PT, DPT 09/05/2020, 9:20 AM  [facility] [address]Phone: [phone]  Fax:  [phone] Name: Michelle CarreroMRN: [id number] of Birth: [birth year]  PHYSICAL THERAPY DISCHARGE SUMMARY  Visits from Start of Care: 6  Current functional level related to goals / functional outcomes: Pt is functional but continues to experience intermittent pain with still postures.    Remaining deficits: Intermittent pain 4/10 with still postures   Education / Equipment: HEP, theraband  Plan: Patient agrees to discharge.  Patient goals were  partially met. Patient is being discharged due to                                                     ?????  Patient  partially met goals and completed 6 required physical therapy visits.

## 2020-09-14 DIAGNOSIS — F330 Major depressive disorder, recurrent, mild: Secondary | ICD-10-CM | POA: Diagnosis not present

## 2020-09-27 DIAGNOSIS — F330 Major depressive disorder, recurrent, mild: Secondary | ICD-10-CM | POA: Diagnosis not present

## 2020-09-28 NOTE — Progress Notes (Signed)
Patient is a 45 yr-old female here for follow-up to a consultation for Bilateral Breast Reduction with Dr. Marla Roe on 06/09/20.  She is 5 feet 3 inches tall and weighed 201 lbs at that visit. She is a 44 DD and would like to be a C Cup.  Since that visit she has completed 6 PT visits; 11/17, 11/30, 12/09, 12/16, 12/21, and 1/4 without relief of her symptoms.  Today she weights 197 lbs. She is still interested in having a bilateral breast reduction. All questions were answered to her satisfaction.

## 2020-09-29 ENCOUNTER — Ambulatory Visit (INDEPENDENT_AMBULATORY_CARE_PROVIDER_SITE_OTHER): Payer: Medicare Other | Admitting: Plastic Surgery

## 2020-09-29 ENCOUNTER — Encounter: Payer: Self-pay | Admitting: Plastic Surgery

## 2020-09-29 ENCOUNTER — Other Ambulatory Visit: Payer: Self-pay

## 2020-09-29 VITALS — BP 130/92 | HR 89 | Temp 98.3°F | Ht 63.0 in | Wt 197.0 lb

## 2020-09-29 DIAGNOSIS — N62 Hypertrophy of breast: Secondary | ICD-10-CM | POA: Diagnosis not present

## 2020-09-29 DIAGNOSIS — G8929 Other chronic pain: Secondary | ICD-10-CM | POA: Diagnosis not present

## 2020-09-29 DIAGNOSIS — M542 Cervicalgia: Secondary | ICD-10-CM | POA: Diagnosis not present

## 2020-09-29 DIAGNOSIS — M546 Pain in thoracic spine: Secondary | ICD-10-CM | POA: Diagnosis not present

## 2020-10-12 DIAGNOSIS — I10 Essential (primary) hypertension: Secondary | ICD-10-CM | POA: Diagnosis not present

## 2020-10-12 DIAGNOSIS — E782 Mixed hyperlipidemia: Secondary | ICD-10-CM | POA: Diagnosis not present

## 2020-10-12 DIAGNOSIS — N920 Excessive and frequent menstruation with regular cycle: Secondary | ICD-10-CM | POA: Diagnosis not present

## 2020-10-12 DIAGNOSIS — D5 Iron 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): Secondary | ICD-10-CM | POA: Diagnosis not present

## 2020-10-12 DIAGNOSIS — M19011 Primary osteoarthritis, right shoulder: Secondary | ICD-10-CM | POA: Diagnosis not present

## 2020-10-12 DIAGNOSIS — R202 Paresthesia of skin: Secondary | ICD-10-CM | POA: Diagnosis not present

## 2020-10-12 DIAGNOSIS — R771 Abnormality of globulin: Secondary | ICD-10-CM | POA: Diagnosis not present

## 2020-10-12 DIAGNOSIS — Z87891 Personal history of nicotine dependence: Secondary | ICD-10-CM | POA: Diagnosis not present

## 2020-10-17 ENCOUNTER — Telehealth: Payer: Self-pay | Admitting: Plastic Surgery

## 2020-10-17 NOTE — Telephone Encounter (Signed)
Attempted to call patient's primary number, but no voicemail picked up. Called home # and left 2 messages, latest date being 2/15. If patient returns the call, we are ready to schedule surgery.

## 2020-10-19 DIAGNOSIS — F330 Major depressive disorder, recurrent, mild: Secondary | ICD-10-CM | POA: Diagnosis not present

## 2020-11-09 DIAGNOSIS — F330 Major depressive disorder, recurrent, mild: Secondary | ICD-10-CM | POA: Diagnosis not present

## 2020-11-16 DIAGNOSIS — I10 Essential (primary) hypertension: Secondary | ICD-10-CM | POA: Diagnosis not present

## 2020-11-16 DIAGNOSIS — D5 Iron 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): Secondary | ICD-10-CM | POA: Diagnosis not present

## 2020-11-16 DIAGNOSIS — N920 Excessive and frequent menstruation with regular cycle: Secondary | ICD-10-CM | POA: Diagnosis not present

## 2020-11-16 DIAGNOSIS — M19011 Primary osteoarthritis, right shoulder: Secondary | ICD-10-CM | POA: Diagnosis not present

## 2020-11-16 DIAGNOSIS — R771 Abnormality of globulin: Secondary | ICD-10-CM | POA: Diagnosis not present

## 2020-11-16 DIAGNOSIS — Z87891 Personal history of nicotine dependence: Secondary | ICD-10-CM | POA: Diagnosis not present

## 2020-11-16 DIAGNOSIS — R202 Paresthesia of skin: Secondary | ICD-10-CM | POA: Diagnosis not present

## 2020-11-16 DIAGNOSIS — E782 Mixed hyperlipidemia: Secondary | ICD-10-CM | POA: Diagnosis not present

## 2020-11-21 DIAGNOSIS — F330 Major depressive disorder, recurrent, mild: Secondary | ICD-10-CM | POA: Diagnosis not present

## 2020-11-28 ENCOUNTER — Ambulatory Visit (INDEPENDENT_AMBULATORY_CARE_PROVIDER_SITE_OTHER): Payer: Medicare Other | Admitting: Surgical

## 2020-11-28 ENCOUNTER — Encounter: Payer: Self-pay | Admitting: Surgical

## 2020-11-28 ENCOUNTER — Other Ambulatory Visit: Payer: Self-pay

## 2020-11-28 VITALS — BP 116/83 | HR 101 | Ht 63.0 in | Wt 199.0 lb

## 2020-11-28 DIAGNOSIS — M542 Cervicalgia: Secondary | ICD-10-CM

## 2020-11-28 DIAGNOSIS — N62 Hypertrophy of breast: Secondary | ICD-10-CM

## 2020-11-28 DIAGNOSIS — M546 Pain in thoracic spine: Secondary | ICD-10-CM

## 2020-11-28 DIAGNOSIS — G8929 Other chronic pain: Secondary | ICD-10-CM

## 2020-11-28 MED ORDER — CEPHALEXIN 500 MG PO CAPS: 500.0000 mg | ORAL_CAPSULE | Freq: Four times a day (QID) | ORAL | 0 refills | Status: AC

## 2020-11-28 MED ORDER — HYDROCODONE-ACETAMINOPHEN 5-325 MG PO TABS: 1.0000 | ORAL_TABLET | Freq: Four times a day (QID) | ORAL | 0 refills | Status: AC | PRN

## 2020-11-28 MED ORDER — ONDANSETRON HCL 4 MG PO TABS: 4.0000 mg | ORAL_TABLET | Freq: Three times a day (TID) | ORAL | 0 refills | Status: DC | PRN

## 2020-11-28 NOTE — Progress Notes (Signed)
Patient ID: Michelle Bell, female    DOB: Oct 14, 1975, 45 y.o.   MRN: [id number]  Chief Complaint  Patient presents with  . Pre-op Exam      ICD-10-CM   1. Chronic bilateral thoracic back pain  M54.6    G89.29   2. Neck pain  M54.2   3. Symptomatic mammary hypertrophy  N62      History of Present Illness: Michelle Bell Sleep is a 45 y.o.  female  with a history of macromastia.  She presents for preoperative evaluation for upcoming procedure, Bilateral Breast Reduction, scheduled for 12/13/2020 with Dr.  Marla Roe.  Patient presents today with a social worker.  She reports that she lives alone.  She is planning to stay overnight postoperatively at the surgical center for observation.  The patient has not had problems with anesthesia. No history of DVT/PE.  No family history of DVT/PE.  No family or personal history of bleeding or clotting disorders.  Patient is not currently taking any blood thinners.  No history of CVA/MI.   Summary of Previous Visit: The STN on the right is 35 cm and the left is 37 cm.  The IMF distance is 20 cm.  She is 5 feet 3 inches tall and weighs 201 pounds.  Preoperative bra size is 42 DD cup.  She would like to be a C cup.  Estimated excess breast tissue to remove the time of surgery equals 650 g on the left and 650 g on the right. She is a non-smoker and not a diabetic.  PMH Significant for: Hypertension, severe iron deficiency anemia  Patient reports he has been feeling well lately, reports no recent infectious symptoms.  Past Medical History: Allergies: No Known Allergies  Current Medications:  Current Outpatient Medications:  .  acetaminophen (TYLENOL) 325 MG tablet, Take 325 mg by mouth daily as needed for moderate pain., Disp: , Rfl:  .  aspirin EC 81 MG tablet, Take 81 mg by mouth daily., Disp: , Rfl:  .  hydrochlorothiazide (HYDRODIURIL) 12.5 MG tablet, Take 12.5 mg by mouth daily., Disp: , Rfl:  .  ibuprofen (ADVIL,MOTRIN) 200 MG  tablet, Take 200 mg by mouth daily as needed for headache or moderate pain., Disp: , Rfl:  .  Multiple Vitamin (MULTIVITAMIN WITH MINERALS) TABS tablet, Take 1 tablet by mouth daily., Disp: , Rfl:   Past Medical Problems: Past Medical History:  Diagnosis Date  . Anemia   . Groin mass    left  . History of blood transfusion   . Hypertension     Past Surgical History: Past Surgical History:  Procedure Laterality Date  . CESAREAN SECTION    . MASS EXCISION Left 12/23/2016   Procedure: EXCISION LEFT GROIN MASS;  Surgeon: Coralie Keens, MD;  Location: [facility];  Service: General;  Laterality: Left;    Social History: Social History   Socioeconomic History  . Marital status: Single    Spouse name: Not on file  . Number of children: Not on file  . Years of education: Not on file  . Highest education level: Not on file  Occupational History  . Not on file  Tobacco Use  . Smoking status: Never Smoker  . Smokeless tobacco: Never Used  Substance and Sexual Activity  . Alcohol use: No  . Drug use: Yes    Types: Marijuana  . Sexual activity: Yes    Birth control/protection: None  Other Topics Concern  . Not on file  Social History Narrative  . Not on file   Social Determinants of Health   Financial Resource Strain: Not on file  Food Insecurity: Not on file  Transportation Needs: Not on file  Physical Activity: Not on file  Stress: Not on file  Social Connections: Not on file  Intimate Partner Violence: Not on file    Family History: Family History  Problem Relation Age of Onset  . Breast cancer Maternal Aunt     Review of Systems: Review of Systems  Constitutional: Negative.   Respiratory: Negative.   Cardiovascular: Negative.   Gastrointestinal: Negative.   Neurological: Negative.     Physical Exam: Vital Signs BP 116/83 (BP Location: Left Arm, Patient Position: Sitting, Cuff Size: Large)   Pulse (!) 101   Ht 5\' 3"  (1.6 m)   Wt 199 lb (90.3 kg)   SpO2  98%   BMI 35.25 kg/m   Physical Exam  Constitutional:      General: Not in acute distress.    Appearance: Normal appearance. Not ill-appearing.  HENT:     Head: Normocephalic and atraumatic.  Eyes:     Pupils: Pupils are equal, round Neck:     Musculoskeletal: Normal range of motion.  Cardiovascular:     Rate and Rhythm: Normal rate    Pulses: Normal pulses.  Pulmonary:     Effort: Pulmonary effort is normal. No respiratory distress.  Abdominal:     General: Abdomen is flat. There is no distension.     Palpations: Abdomen is soft.     Tenderness: There is no abdominal tenderness.  Musculoskeletal: Normal range of motion.  Skin:    General: Skin is warm and dry.     Findings: No erythema or rash.  Neurological:     General: No focal deficit present.     Mental Status: Alert and oriented to person, place, and time. Mental status is at baseline.     Motor: No weakness.  Psychiatric:        Mood and Affect: Mood normal.        Behavior: Behavior normal.    Assessment/Plan: The patient is scheduled for bilateral breast reduction with Dr. Marla Roe.  Risks, benefits, and alternatives of procedure discussed, questions answered and consent obtained.    Smoking Status: Non-smoker; Counseling Given?  N/A Last Mammogram: 04/09/2020; Results: Negative  Caprini Score: 4, moderate; Risk Factors include: Age, BMI greater than 25, and length of planned surgery. Recommendation for mechanical and pharmacological prophylaxis while hospitalized. Encourage early ambulation.   Pictures obtained:@[email or web address]   Post-op Rx sent to pharmacy: Norco, Zofran, Keflex  Patient was provided with the breast reduction and General Surgical Risk consent document and Pain Medication Agreement prior to their appointment.  They had adequate time to read through the risk consent documents and Pain Medication Agreement. We also discussed them in person together during this preop appointment. All of their questions  were answered to their satisfaction.  Recommended calling if they have any further questions.  Risk consent form and Pain Medication Agreement to be scanned into patient's chart.  The risk that can be encountered with breast reduction were discussed and include the following but not limited to these:  Breast asymmetry, fluid accumulation, firmness of the breast, inability to breast feed, loss of nipple or areola, skin loss, decrease or no nipple sensation, fat necrosis of the breast tissue, bleeding, infection, healing delay.  There are risks of anesthesia, changes to skin sensation and injury to nerves or blood vessels.  The muscle can be temporarily or permanently injured.  You may have an allergic reaction to tape, suture, glue, blood products which can result in skin discoloration, swelling, pain, skin lesions, poor healing.  Any of these can lead to the need for revisonal surgery or stage procedures.  A reduction has potential to interfere with diagnostic procedures.  Nipple or breast piercing can increase risks of infection.  This procedure is best done when the breast is fully developed.  Changes in the breast will continue to occur over time.  Pregnancy can alter the outcomes of previous breast reduction surgery, weight gain and weigh loss can also effect the long term appearance.   We discussed the possibility of amputation/free nipple graft technique due to the length of her STN.  She is understanding of the possibility that we would need to transition from a pedicle technique to a free nipple graft technique intraoperatively.  We discussed the risks associated with free nipple graft breast reductions, including but not limited to failure of the graft, partial loss of the graft, loss of sensation of bilateral nipple areola, complete loss of the nipple areola graft, loss of her nipple, inability to breast-feed, postoperative wounds, ongoing wound care.  We also discussed the risks associated with the  pedicle technique.  We discussed that with the pedicle technique she could develop nipple areolar necrosis which would result in loss of the nipple/areolar complex, this would also result in ongoing wound care and possible changes in the shape of her breast.  Recommend holding aspirin 1 week prior to surgery, she reports she takes this for pain control.  Recommend holding any over-the-counter supplements including but not limited to elderberry turmeric black cohosh, garlic or fish oil.  Patient is understanding of this.    Electronically signed by: Carola Rhine Roopa Graver, PA-C 11/28/2020 10:41 AM

## 2020-12-05 DIAGNOSIS — R771 Abnormality of globulin: Secondary | ICD-10-CM | POA: Diagnosis not present

## 2020-12-05 DIAGNOSIS — Z87891 Personal history of nicotine dependence: Secondary | ICD-10-CM | POA: Diagnosis not present

## 2020-12-05 DIAGNOSIS — M25472 Effusion, left ankle: Secondary | ICD-10-CM | POA: Diagnosis not present

## 2020-12-05 DIAGNOSIS — I10 Essential (primary) hypertension: Secondary | ICD-10-CM | POA: Diagnosis not present

## 2020-12-05 DIAGNOSIS — M19011 Primary osteoarthritis, right shoulder: Secondary | ICD-10-CM | POA: Diagnosis not present

## 2020-12-05 DIAGNOSIS — N920 Excessive and frequent menstruation with regular cycle: Secondary | ICD-10-CM | POA: Diagnosis not present

## 2020-12-05 DIAGNOSIS — E782 Mixed hyperlipidemia: Secondary | ICD-10-CM | POA: Diagnosis not present

## 2020-12-05 DIAGNOSIS — D5 Iron 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): Secondary | ICD-10-CM | POA: Diagnosis not present

## 2020-12-05 DIAGNOSIS — R202 Paresthesia of skin: Secondary | ICD-10-CM | POA: Diagnosis not present

## 2020-12-08 DIAGNOSIS — F900 Attention-deficit hyperactivity disorder, predominantly inattentive type: Secondary | ICD-10-CM | POA: Diagnosis not present

## 2020-12-08 DIAGNOSIS — F330 Major depressive disorder, recurrent, mild: Secondary | ICD-10-CM | POA: Diagnosis not present

## 2020-12-11 ENCOUNTER — Other Ambulatory Visit (HOSPITAL_COMMUNITY): Payer: Medicare Other

## 2020-12-12 ENCOUNTER — Telehealth: Payer: Self-pay | Admitting: Plastic Surgery

## 2020-12-12 DIAGNOSIS — R202 Paresthesia of skin: Secondary | ICD-10-CM | POA: Diagnosis not present

## 2020-12-12 DIAGNOSIS — Z87891 Personal history of nicotine dependence: Secondary | ICD-10-CM | POA: Diagnosis not present

## 2020-12-12 DIAGNOSIS — N920 Excessive and frequent menstruation with regular cycle: Secondary | ICD-10-CM | POA: Diagnosis not present

## 2020-12-12 DIAGNOSIS — Z0001 Encounter for general adult medical examination with abnormal findings: Secondary | ICD-10-CM | POA: Diagnosis not present

## 2020-12-12 DIAGNOSIS — D5 Iron 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): Secondary | ICD-10-CM | POA: Diagnosis not present

## 2020-12-12 DIAGNOSIS — E782 Mixed hyperlipidemia: Secondary | ICD-10-CM | POA: Diagnosis not present

## 2020-12-12 DIAGNOSIS — I10 Essential (primary) hypertension: Secondary | ICD-10-CM | POA: Diagnosis not present

## 2020-12-12 NOTE — Telephone Encounter (Signed)
Patient called 4/5 to cancel surgery due to leg swelling. All appointments for surgery were canceled. Attempted to call Ms. Myles 4/12 to check in, but patient did not answer. Left message for patient to call back at her convenience.

## 2020-12-13 ENCOUNTER — Ambulatory Visit (HOSPITAL_BASED_OUTPATIENT_CLINIC_OR_DEPARTMENT_OTHER): Admit: 2020-12-13 | Payer: Medicare Other | Admitting: Plastic Surgery

## 2020-12-13 ENCOUNTER — Encounter (HOSPITAL_BASED_OUTPATIENT_CLINIC_OR_DEPARTMENT_OTHER): Payer: Self-pay

## 2020-12-13 SURGERY — BREAST REDUCTION WITH LIPOSUCTION
Anesthesia: General | Site: Breast | Laterality: Bilateral

## 2020-12-19 DIAGNOSIS — Z87891 Personal history of nicotine dependence: Secondary | ICD-10-CM | POA: Diagnosis not present

## 2020-12-19 DIAGNOSIS — D5 Iron 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): Secondary | ICD-10-CM | POA: Diagnosis not present

## 2020-12-19 DIAGNOSIS — R202 Paresthesia of skin: Secondary | ICD-10-CM | POA: Diagnosis not present

## 2020-12-19 DIAGNOSIS — I10 Essential (primary) hypertension: Secondary | ICD-10-CM | POA: Diagnosis not present

## 2020-12-19 DIAGNOSIS — E782 Mixed hyperlipidemia: Secondary | ICD-10-CM | POA: Diagnosis not present

## 2020-12-19 DIAGNOSIS — N920 Excessive and frequent menstruation with regular cycle: Secondary | ICD-10-CM | POA: Diagnosis not present

## 2020-12-21 ENCOUNTER — Ambulatory Visit (INDEPENDENT_AMBULATORY_CARE_PROVIDER_SITE_OTHER): Payer: Medicare Other | Admitting: Podiatry

## 2020-12-21 ENCOUNTER — Other Ambulatory Visit: Payer: Self-pay

## 2020-12-21 DIAGNOSIS — M19072 Primary osteoarthritis, left ankle and foot: Secondary | ICD-10-CM | POA: Diagnosis not present

## 2020-12-22 ENCOUNTER — Encounter: Payer: Medicare Other | Admitting: Plastic Surgery

## 2020-12-25 NOTE — Progress Notes (Signed)
  Subjective:  Patient ID: Michelle Bell, female    DOB: 1976/07/17,  MRN: [id number]  Chief Complaint  Patient presents with  . Arthritis    Left pain     45 y.o. female presents with the above complaint.  Patient presents with complaint of left dorsal foot pain.  Patient states that she had given injection in the past to the talonavicular joint which seems to have improved and given relief for many months.  She would like to know if she can do another injection.  She denies any other acute complaints.  She does not want to do any kind of surgery at this time.  She would like to conservative manage for now.   Review of Systems: Negative except as noted in the HPI. Denies N/V/F/Ch.  Past Medical History:  Diagnosis Date  . Anemia   . Groin mass    left  . History of blood transfusion   . Hypertension     Current Outpatient Medications:  .  acetaminophen (TYLENOL) 325 MG tablet, Take 325 mg by mouth daily as needed for moderate pain., Disp: , Rfl:  .  aspirin EC 81 MG tablet, Take 81 mg by mouth daily., Disp: , Rfl:  .  hydrochlorothiazide (HYDRODIURIL) 12.5 MG tablet, Take 12.5 mg by mouth daily., Disp: , Rfl:  .  ibuprofen (ADVIL,MOTRIN) 200 MG tablet, Take 200 mg by mouth daily as needed for headache or moderate pain., Disp: , Rfl:  .  Multiple Vitamin (MULTIVITAMIN WITH MINERALS) TABS tablet, Take 1 tablet by mouth daily., Disp: , Rfl:  .  ondansetron (ZOFRAN) 4 MG tablet, Take 1 tablet (4 mg total) by mouth every 8 (eight) hours as needed for nausea or vomiting., Disp: 20 tablet, Rfl: 0  Social History   Tobacco Use  Smoking Status Never Smoker  Smokeless Tobacco Never Used    No Known Allergies Objective:  There were no vitals filed for this visit. There is no height or weight on file to calculate BMI. Constitutional Well developed. Well nourished.  Vascular Dorsalis pedis pulses palpable bilaterally. Posterior tibial pulses palpable bilaterally. Capillary  refill normal to all digits.  No cyanosis or clubbing noted. Pedal hair growth normal.  Neurologic Normal speech. Oriented to person, place, and time. Epicritic sensation to light touch grossly present bilaterally.  Dermatologic Nails well groomed and normal in appearance. No open wounds. No skin lesions.  Orthopedic:  Pain on palpation left talonavicular joint.  Pain with range of motion of the joint.  Mild crepitus noted.  No pain at the subtalar joint.  No pain along the course of posterior tibial tendon peroneal tendon Achilles tendon noted.   Radiographs: None Assessment:   1. Primary osteoarthritis of left ankle    Plan:  Patient was evaluated and treated and all questions answered.  Left talonavicular joint arthritis -I explained the patient the etiology of arthritis and given the amount of pain she is having I believe she will benefit from a steroid injection.  As long as this is giving her relief we will hold off on any kind of surgical intervention for now.  If she continues to have pain we will discuss surgical option at that time.  Patient agrees with the steroid injection would like to proceed with that -A steroid injection was performed at right medial foot at talonavicular joint using 1% plain Lidocaine and 10 mg of Kenalog. This was well tolerated.   No follow-ups on file.

## 2020-12-26 ENCOUNTER — Encounter: Payer: Self-pay | Admitting: Podiatry

## 2021-01-02 ENCOUNTER — Encounter: Payer: Medicare Other | Admitting: Surgical

## 2021-01-05 DIAGNOSIS — F900 Attention-deficit hyperactivity disorder, predominantly inattentive type: Secondary | ICD-10-CM | POA: Diagnosis not present

## 2021-01-05 DIAGNOSIS — F330 Major depressive disorder, recurrent, mild: Secondary | ICD-10-CM | POA: Diagnosis not present

## 2021-01-15 NOTE — Progress Notes (Signed)
   Patient ID: Michelle Bell, female    DOB: 03/23/1976, 44 y.o.   MRN: [id number]  Chief Complaint  Patient presents with  . Pre-op Exam      ICD-10-CM   1. Chronic bilateral thoracic back pain  M54.6    G89.29   2. Neck pain  M54.2   3. Symptomatic mammary hypertrophy  N62   \  History of Present Illness: Michelle Bell is a 44 y.o.  female  with a history of macromastia.  She presents for preoperative evaluation for upcoming procedure, Bilateral Breast Reduction with possible liposuction, scheduled for 01/31/2021 with Dr.  Dillingham.  Patient presents today with social worker.  Patient previously had preoperative appointment for scheduled bilateral breast reduction in March 2022 but her surgery was canceled because she had some ankle swelling and pain.  She reports that she had a steroid injection which has helped.  She is scheduled to see them again soon.  The patient has not had problems with anesthesia. No history of DVT/PE.  No family history of DVT/PE.  No family or personal history of bleeding or clotting disorders.  Patient is not currently taking any blood thinners.  No history of CVA/MI.   Summary of Previous Visit: STN on the right is 35 cm and the left is 37 cm.  Preoperative bra size is 42 double D cup.  She would like to be a C cup.  Estimated excess breast tissue to be removed at time of surgery: 650 on the left and 650 on the right.  PMH Significant for: Hypertension, severe iron deficiency anemia  Patient reports overall she is feeling well.  No fevers, chills, nausea, vomiting, chest pain, shortness of breath.   Past Medical History: Allergies: No Known Allergies  Current Medications:  Current Outpatient Medications:  .  acetaminophen (TYLENOL) 325 MG tablet, Take 325 mg by mouth daily as needed for moderate pain., Disp: , Rfl:  .  aspirin EC 81 MG tablet, Take 81 mg by mouth daily., Disp: , Rfl:  .  hydrochlorothiazide (HYDRODIURIL) 12.5 MG  tablet, Take 12.5 mg by mouth daily., Disp: , Rfl:  .  ibuprofen (ADVIL,MOTRIN) 200 MG tablet, Take 200 mg by mouth daily as needed for headache or moderate pain., Disp: , Rfl:  .  Multiple Vitamin (MULTIVITAMIN WITH MINERALS) TABS tablet, Take 1 tablet by mouth daily., Disp: , Rfl:  .  ondansetron (ZOFRAN) 4 MG tablet, Take 1 tablet (4 mg total) by mouth every 8 (eight) hours as needed for nausea or vomiting., Disp: 20 tablet, Rfl: 0  Past Medical Problems: Past Medical History:  Diagnosis Date  . Anemia   . Groin mass    left  . History of blood transfusion   . Hypertension     Past Surgical History: Past Surgical History:  Procedure Laterality Date  . CESAREAN SECTION    . MASS EXCISION Left 12/23/2016   Procedure: EXCISION LEFT GROIN MASS;  Surgeon: Douglas Blackman, MD;  Location: [facility];  Service: General;  Laterality: Left;    Social History: Social History   Socioeconomic History  . Marital status: Single    Spouse name: Not on file  . Number of children: Not on file  . Years of education: Not on file  . Highest education level: Not on file  Occupational History  . Not on file  Tobacco Use  . Smoking status: Never Smoker  . Smokeless tobacco: Never Used  Substance and Sexual Activity  .   Alcohol use: No  . Drug use: Yes    Types: Marijuana  . Sexual activity: Yes    Birth control/protection: None  Other Topics Concern  . Not on file  Social History Narrative  . Not on file   Social Determinants of Health   Financial Resource Strain: Not on file  Food Insecurity: Not on file  Transportation Needs: Not on file  Physical Activity: Not on file  Stress: Not on file  Social Connections: Not on file  Intimate Partner Violence: Not on file    Family History: Family History  Problem Relation Age of Onset  . Breast cancer Maternal Aunt     Review of Systems: ROS  Physical Exam: Vital Signs BP 128/86 (BP Location: Left Arm, Patient Position: Sitting,  Cuff Size: Normal)   Pulse 89   Ht 5\' 3"  (1.6 m)   Wt 197 lb 6.4 oz (89.5 kg)   SpO2 96%   BMI 34.97 kg/m   Physical Exam  Constitutional:      General: Not in acute distress.    Appearance: Normal appearance. Not ill-appearing.  HENT:     Head: Normocephalic and atraumatic.  Eyes:     Pupils: Pupils are equal, round Neck:     Musculoskeletal: Normal range of motion.  Cardiovascular:     Rate and Rhythm: Normal rate    Pulses: Normal pulses.  Pulmonary:     Effort: Pulmonary effort is normal. No respiratory distress.  Abdominal:     General: Abdomen is flat. There is no distension.  Musculoskeletal: Normal range of motion.  Skin:    General: Skin is warm and dry.     Findings: No erythema or rash.  Neurological:     General: No focal deficit present.     Mental Status: Alert and oriented to person, place, and time. Mental status is at baseline.     Motor: No weakness.  Psychiatric:        Mood and Affect: Mood normal.        Behavior: Behavior normal.    Assessment/Plan: The patient is scheduled for bilateral breast reduction with Dr. Marla Roe.  Risks, benefits, and alternatives of procedure discussed, questions answered and consent obtained.    Smoking Status: Non-smoker; Counseling Given?  N/A Last Mammogram: 04/09/2020; Results: It if  Caprini Score: Moderate 4; Risk Factors include: Age, BMI than 25, and length of planned surgery. Recommendation for mechanical and pharmacological prophylaxis while hospitalized. Encourage early ambulation.   Pictures obtained:[email or web address]   Post-op Rx sent to pharmacy: Norco, Zofran, Keflex  Patient was provided with the breast reduction and General Surgical Risk consent document and Pain Medication Agreement prior to their appointment.  They had adequate time to read through the risk consent documents and Pain Medication Agreement. We also discussed them in person together during this preop appointment. All of their questions were  answered to their satisfaction.  Recommended calling if they have any further questions.  Risk consent form and Pain Medication Agreement to be scanned into patient's chart.  The risk that can be encountered with breast reduction were discussed and include the following but not limited to these:  Breast asymmetry, fluid accumulation, firmness of the breast, inability to breast feed, loss of nipple or areola, skin loss, decrease or no nipple sensation, fat necrosis of the breast tissue, bleeding, infection, healing delay.  There are risks of anesthesia, changes to skin sensation and injury to nerves or blood vessels.  The muscle can be temporarily or  permanently injured.  You may have an allergic reaction to tape, suture, glue, blood products which can result in skin discoloration, swelling, pain, skin lesions, poor healing.  Any of these can lead to the need for revisonal surgery or stage procedures.  A reduction has potential to interfere with diagnostic procedures.  Nipple or breast piercing can increase risks of infection.  This procedure is best done when the breast is fully developed.  Changes in the breast will continue to occur over time.  Pregnancy can alter the outcomes of previous breast reduction surgery, weight gain and weigh loss can also effect the long term appearance.   The risks that can be encountered with and after liposuction were discussed and include the following but no limited to these:  Asymmetry, fluid accumulation, firmness of the area, fat necrosis with death of fat tissue, bleeding, infection, delayed healing, anesthesia risks, skin sensation changes, injury to structures including nerves, blood vessels, and muscles which may be temporary or permanent, allergies to tape, suture materials and glues, blood products, topical preparations or injected agents, skin and contour irregularities, skin discoloration and swelling, deep vein thrombosis, cardiac and pulmonary complications, pain,  which may persist, persistent pain, recurrence of the lesion, poor healing of the incision, possible need for revisional surgery or staged procedures. Thiere can also be persistent swelling, poor wound healing, rippling or loose skin, worsening of cellulite, swelling, and thermal burn or heat injury from ultrasound with the ultrasound-assisted lipoplasty technique. Any change in weight fluctuations can alter the outcome.  We discussed the possibility of amputation/free nipple graft technique due to the length of her STN.  She is understanding of the possibility that we would need to transition from a pedicle technique to a free nipple graft technique intraoperatively.  We discussed the risks associated with free nipple graft breast reductions, including but not limited to failure of the graft, partial loss of the graft, loss of sensation of bilateral nipple areola, complete loss of the nipple areola graft, inability to breast-feed, postoperative wounds, ongoing wound care.  We also discussed the risks associated with the pedicle technique.  We discussed that with the pedicle technique she could develop nipple areolar necrosis which would result in loss of the nipple, this would also result in ongoing wound care and possible changes in the shape of her breast.   Recommend holding aspirin 1 week prior to surgery.  Recommend holding any over-the-counter supplements including but not limited to elderberry, turmeric, fish oil, vitamin E, vitamin D 1 week prior to surgery.    Electronically signed by: Jany Buckwalter J Adit Riddles, PA-C 01/16/2021 9:59 AM 

## 2021-01-15 NOTE — H&P (View-Only) (Signed)
Patient ID: Michelle Bell, female    DOB: [birth year], 45 y.o.   MRN: [id number]  Chief Complaint  Patient presents with  . Pre-op Exam      ICD-10-CM   1. Chronic bilateral thoracic back pain  M54.6    G89.29   2. Neck pain  M54.2   3. Symptomatic mammary hypertrophy  N62   \  History of Present Illness: Michelle Bell is a 45 y.o.  female  with a history of macromastia.  She presents for preoperative evaluation for upcoming procedure, Bilateral Breast Reduction with possible liposuction, scheduled for 01/31/2021 with Dr.  Marla Roe.  Patient presents today with social worker.  Patient previously had preoperative appointment for scheduled bilateral breast reduction in March 2022 but her surgery was canceled because she had some ankle swelling and pain.  She reports that she had a steroid injection which has helped.  She is scheduled to see them again soon.  The patient has not had problems with anesthesia. No history of DVT/PE.  No family history of DVT/PE.  No family or personal history of bleeding or clotting disorders.  Patient is not currently taking any blood thinners.  No history of CVA/MI.   Summary of Previous Visit: STN on the right is 35 cm and the left is 37 cm.  Preoperative bra size is 42 double D cup.  She would like to be a C cup.  Estimated excess breast tissue to be removed at time of surgery: 650 on the left and 650 on the right.  PMH Significant for: Hypertension, severe iron deficiency anemia  Patient reports overall she is feeling well.  No fevers, chills, nausea, vomiting, chest pain, shortness of breath.   Past Medical History: Allergies: No Known Allergies  Current Medications:  Current Outpatient Medications:  .  acetaminophen (TYLENOL) 325 MG tablet, Take 325 mg by mouth daily as needed for moderate pain., Disp: , Rfl:  .  aspirin EC 81 MG tablet, Take 81 mg by mouth daily., Disp: , Rfl:  .  hydrochlorothiazide (HYDRODIURIL) 12.5 MG  tablet, Take 12.5 mg by mouth daily., Disp: , Rfl:  .  ibuprofen (ADVIL,MOTRIN) 200 MG tablet, Take 200 mg by mouth daily as needed for headache or moderate pain., Disp: , Rfl:  .  Multiple Vitamin (MULTIVITAMIN WITH MINERALS) TABS tablet, Take 1 tablet by mouth daily., Disp: , Rfl:  .  ondansetron (ZOFRAN) 4 MG tablet, Take 1 tablet (4 mg total) by mouth every 8 (eight) hours as needed for nausea or vomiting., Disp: 20 tablet, Rfl: 0  Past Medical Problems: Past Medical History:  Diagnosis Date  . Anemia   . Groin mass    left  . History of blood transfusion   . Hypertension     Past Surgical History: Past Surgical History:  Procedure Laterality Date  . CESAREAN SECTION    . MASS EXCISION Left 12/23/2016   Procedure: EXCISION LEFT GROIN MASS;  Surgeon: Coralie Keens, MD;  Location: [facility];  Service: General;  Laterality: Left;    Social History: Social History   Socioeconomic History  . Marital status: Single    Spouse name: Not on file  . Number of children: Not on file  . Years of education: Not on file  . Highest education level: Not on file  Occupational History  . Not on file  Tobacco Use  . Smoking status: Never Smoker  . Smokeless tobacco: Never Used  Substance and Sexual Activity  .  Alcohol use: No  . Drug use: Yes    Types: Marijuana  . Sexual activity: Yes    Birth control/protection: None  Other Topics Concern  . Not on file  Social History Narrative  . Not on file   Social Determinants of Health   Financial Resource Strain: Not on file  Food Insecurity: Not on file  Transportation Needs: Not on file  Physical Activity: Not on file  Stress: Not on file  Social Connections: Not on file  Intimate Partner Violence: Not on file    Family History: Family History  Problem Relation Age of Onset  . Breast cancer Maternal Aunt     Review of Systems: ROS  Physical Exam: Vital Signs BP 128/86 (BP Location: Left Arm, Patient Position: Sitting,  Cuff Size: Normal)   Pulse 89   Ht 5\' 3"  (1.6 m)   Wt 197 lb 6.4 oz (89.5 kg)   SpO2 96%   BMI 34.97 kg/m   Physical Exam  Constitutional:      General: Not in acute distress.    Appearance: Normal appearance. Not ill-appearing.  HENT:     Head: Normocephalic and atraumatic.  Eyes:     Pupils: Pupils are equal, round Neck:     Musculoskeletal: Normal range of motion.  Cardiovascular:     Rate and Rhythm: Normal rate    Pulses: Normal pulses.  Pulmonary:     Effort: Pulmonary effort is normal. No respiratory distress.  Abdominal:     General: Abdomen is flat. There is no distension.  Musculoskeletal: Normal range of motion.  Skin:    General: Skin is warm and dry.     Findings: No erythema or rash.  Neurological:     General: No focal deficit present.     Mental Status: Alert and oriented to person, place, and time. Mental status is at baseline.     Motor: No weakness.  Psychiatric:        Mood and Affect: Mood normal.        Behavior: Behavior normal.    Assessment/Plan: The patient is scheduled for bilateral breast reduction with Dr. Marla Roe.  Risks, benefits, and alternatives of procedure discussed, questions answered and consent obtained.    Smoking Status: Non-smoker; Counseling Given?  N/A Last Mammogram: 04/09/2020; Results: It if  Caprini Score: Moderate 4; Risk Factors include: Age, BMI than 25, and length of planned surgery. Recommendation for mechanical and pharmacological prophylaxis while hospitalized. Encourage early ambulation.   Pictures obtained:@[email or web address]   Post-op Rx sent to pharmacy: Norco, Zofran, Keflex  Patient was provided with the breast reduction and General Surgical Risk consent document and Pain Medication Agreement prior to their appointment.  They had adequate time to read through the risk consent documents and Pain Medication Agreement. We also discussed them in person together during this preop appointment. All of their questions were  answered to their satisfaction.  Recommended calling if they have any further questions.  Risk consent form and Pain Medication Agreement to be scanned into patient's chart.  The risk that can be encountered with breast reduction were discussed and include the following but not limited to these:  Breast asymmetry, fluid accumulation, firmness of the breast, inability to breast feed, loss of nipple or areola, skin loss, decrease or no nipple sensation, fat necrosis of the breast tissue, bleeding, infection, healing delay.  There are risks of anesthesia, changes to skin sensation and injury to nerves or blood vessels.  The muscle can be temporarily or  permanently injured.  You may have an allergic reaction to tape, suture, glue, blood products which can result in skin discoloration, swelling, pain, skin lesions, poor healing.  Any of these can lead to the need for revisonal surgery or stage procedures.  A reduction has potential to interfere with diagnostic procedures.  Nipple or breast piercing can increase risks of infection.  This procedure is best done when the breast is fully developed.  Changes in the breast will continue to occur over time.  Pregnancy can alter the outcomes of previous breast reduction surgery, weight gain and weigh loss can also effect the long term appearance.   The risks that can be encountered with and after liposuction were discussed and include the following but no limited to these:  Asymmetry, fluid accumulation, firmness of the area, fat necrosis with death of fat tissue, bleeding, infection, delayed healing, anesthesia risks, skin sensation changes, injury to structures including nerves, blood vessels, and muscles which may be temporary or permanent, allergies to tape, suture materials and glues, blood products, topical preparations or injected agents, skin and contour irregularities, skin discoloration and swelling, deep vein thrombosis, cardiac and pulmonary complications, pain,  which may persist, persistent pain, recurrence of the lesion, poor healing of the incision, possible need for revisional surgery or staged procedures. Thiere can also be persistent swelling, poor wound healing, rippling or loose skin, worsening of cellulite, swelling, and thermal burn or heat injury from ultrasound with the ultrasound-assisted lipoplasty technique. Any change in weight fluctuations can alter the outcome.  We discussed the possibility of amputation/free nipple graft technique due to the length of her STN.  She is understanding of the possibility that we would need to transition from a pedicle technique to a free nipple graft technique intraoperatively.  We discussed the risks associated with free nipple graft breast reductions, including but not limited to failure of the graft, partial loss of the graft, loss of sensation of bilateral nipple areola, complete loss of the nipple areola graft, inability to breast-feed, postoperative wounds, ongoing wound care.  We also discussed the risks associated with the pedicle technique.  We discussed that with the pedicle technique she could develop nipple areolar necrosis which would result in loss of the nipple, this would also result in ongoing wound care and possible changes in the shape of her breast.   Recommend holding aspirin 1 week prior to surgery.  Recommend holding any over-the-counter supplements including but not limited to elderberry, turmeric, fish oil, vitamin E, vitamin D 1 week prior to surgery.    Electronically signed by: Carola Rhine Cletus Mehlhoff, PA-C 01/16/2021 9:59 AM

## 2021-01-16 ENCOUNTER — Ambulatory Visit (INDEPENDENT_AMBULATORY_CARE_PROVIDER_SITE_OTHER): Payer: Medicare Other | Admitting: Surgical

## 2021-01-16 ENCOUNTER — Other Ambulatory Visit: Payer: Self-pay

## 2021-01-16 ENCOUNTER — Encounter: Payer: Self-pay | Admitting: Surgical

## 2021-01-16 VITALS — BP 128/86 | HR 89 | Ht 63.0 in | Wt 197.4 lb

## 2021-01-16 DIAGNOSIS — N62 Hypertrophy of breast: Secondary | ICD-10-CM

## 2021-01-16 DIAGNOSIS — M546 Pain in thoracic spine: Secondary | ICD-10-CM

## 2021-01-16 DIAGNOSIS — G8929 Other chronic pain: Secondary | ICD-10-CM

## 2021-01-16 DIAGNOSIS — M542 Cervicalgia: Secondary | ICD-10-CM

## 2021-01-16 MED ORDER — HYDROCODONE-ACETAMINOPHEN 5-325 MG PO TABS: 1.0000 | ORAL_TABLET | Freq: Four times a day (QID) | ORAL | 0 refills | Status: AC | PRN

## 2021-01-16 MED ORDER — ONDANSETRON HCL 4 MG PO TABS: 4.0000 mg | ORAL_TABLET | Freq: Three times a day (TID) | ORAL | 0 refills | Status: DC | PRN

## 2021-01-16 MED ORDER — CEPHALEXIN 500 MG PO CAPS: 500.0000 mg | ORAL_CAPSULE | Freq: Four times a day (QID) | ORAL | 0 refills | Status: AC

## 2021-01-23 ENCOUNTER — Other Ambulatory Visit: Payer: Self-pay

## 2021-01-23 ENCOUNTER — Encounter (HOSPITAL_BASED_OUTPATIENT_CLINIC_OR_DEPARTMENT_OTHER): Payer: Self-pay | Admitting: Plastic Surgery

## 2021-01-24 ENCOUNTER — Ambulatory Visit (INDEPENDENT_AMBULATORY_CARE_PROVIDER_SITE_OTHER): Payer: Medicare Other | Admitting: Podiatry

## 2021-01-24 DIAGNOSIS — M19072 Primary osteoarthritis, left ankle and foot: Secondary | ICD-10-CM

## 2021-01-26 ENCOUNTER — Encounter: Payer: Self-pay | Admitting: Podiatry

## 2021-01-26 ENCOUNTER — Ambulatory Visit: Payer: Medicare Other | Admitting: Podiatry

## 2021-01-26 ENCOUNTER — Other Ambulatory Visit (HOSPITAL_COMMUNITY)
Admission: RE | Admit: 2021-01-26 | Discharge: 2021-01-26 | Disposition: A | Discharge status: Home / Self Care | Payer: Medicare Other | Source: Ambulatory Visit | Attending: Plastic Surgery | Admitting: Plastic Surgery

## 2021-01-26 DIAGNOSIS — Z01812 Encounter for preprocedural laboratory examination: Secondary | ICD-10-CM | POA: Diagnosis not present

## 2021-01-26 DIAGNOSIS — Z20822 Contact with and (suspected) exposure to covid-19: Secondary | ICD-10-CM | POA: Insufficient documentation

## 2021-01-26 LAB — SARS CORONAVIRUS 2 (TAT 6-24 HRS): SARS Coronavirus 2: NEGATIVE

## 2021-01-26 NOTE — Progress Notes (Signed)
Subjective:  Patient ID: Michelle Bell, female    DOB: 04/27/1976,  MRN: [id number]  Chief Complaint  Patient presents with  . Foot Pain    Left foot pain  PT stated that she still has some pain off and on     45 y.o. female presents with the above complaint.  Patient presents with follow-up of left talonavicular joint arthritis flare.  She states the steroid injection did help some she still has some swelling the pain is on and off.  She still has a pretty good amount of pain.  She would like to discuss what her other treatment options are.  She is also discussed thinking about surgical intervention as well.  She denies any other acute complaints.   Review of Systems: Negative except as noted in the HPI. Denies N/V/F/Ch.  Past Medical History:  Diagnosis Date  . Anemia   . Groin mass    left  . History of blood transfusion   . Hypertension     Current Outpatient Medications:  .  acetaminophen (TYLENOL) 325 MG tablet, Take 325 mg by mouth daily as needed for moderate pain., Disp: , Rfl:  .  amLODipine (NORVASC) 5 MG tablet, Take 5 mg by mouth daily., Disp: , Rfl:  .  aspirin EC 81 MG tablet, Take 81 mg by mouth daily., Disp: , Rfl:  .  ferrous sulfate 325 (65 FE) MG tablet, Take 325 mg by mouth daily with breakfast., Disp: , Rfl:  .  ibuprofen (ADVIL,MOTRIN) 200 MG tablet, Take 200 mg by mouth daily as needed for headache or moderate pain., Disp: , Rfl:  .  Multiple Vitamin (MULTIVITAMIN WITH MINERALS) TABS tablet, Take 1 tablet by mouth daily., Disp: , Rfl:  .  ondansetron (ZOFRAN) 4 MG tablet, Take 1 tablet (4 mg total) by mouth every 8 (eight) hours as needed for nausea or vomiting., Disp: 20 tablet, Rfl: 0  Social History   Tobacco Use  Smoking Status Never Smoker  Smokeless Tobacco Never Used    No Known Allergies Objective:  There were no vitals filed for this visit. There is no height or weight on file to calculate BMI. Constitutional Well developed. Well  nourished.  Vascular Dorsalis pedis pulses palpable bilaterally. Posterior tibial pulses palpable bilaterally. Capillary refill normal to all digits.  No cyanosis or clubbing noted. Pedal hair growth normal.  Neurologic Normal speech. Oriented to person, place, and time. Epicritic sensation to light touch grossly present bilaterally.  Dermatologic Nails well groomed and normal in appearance. No open wounds. No skin lesions.  Orthopedic:  Pain on palpation left talonavicular joint.  Pain with range of motion of the joint.  Mild crepitus noted.  Mild pain at the subtalar joint.  Pain with range of motion of the subtalar joint.  No crepitus clinically appreciated.  No pain along the course of posterior tibial tendon peroneal tendon Achilles tendon noted.  Positive Silfverskiold test noted gastrocnemius equinus   Radiographs: None Assessment:   1. Primary osteoarthritis of left ankle    Plan:  Patient was evaluated and treated and all questions answered.  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  She states that she still has pain on and off.  She has severe flatfoot deformity with concern for underlying osteoarthritic changes to the subtalar joint as  well as talonavicular joint.  At this time I discussed with the patient in extensive detail given her foot structure would not resolve meant pain she may benefit from a CT scan to evaluate the joints of her foot.  She has had longstanding pes planovalgus which could have resulted to arthritic changes in her subtalar joint as well as talonavicular joint.  Patient agrees with plan like to obtain CT scan. -I discussed with her that if she continues to not have resolve meant I believe she will benefit from surgical intervention which involves fusing  multiple hindfoot joints.  I will see what the CT scan shows and we will proceed with further planning based off of that.  She states understanding  No follow-ups on file.

## 2021-01-30 NOTE — Progress Notes (Signed)
Sent text reminding pt to come in for lab work.  

## 2021-01-31 ENCOUNTER — Encounter (HOSPITAL_BASED_OUTPATIENT_CLINIC_OR_DEPARTMENT_OTHER): Payer: Self-pay | Admitting: Plastic Surgery

## 2021-01-31 ENCOUNTER — Observation Stay (HOSPITAL_BASED_OUTPATIENT_CLINIC_OR_DEPARTMENT_OTHER)
Admission: RE | Admit: 2021-01-31 | Discharge: 2021-02-01 | Disposition: A | Discharge status: Home / Self Care | Payer: Medicare Other | Attending: Plastic Surgery | Admitting: Plastic Surgery

## 2021-01-31 ENCOUNTER — Ambulatory Visit (HOSPITAL_BASED_OUTPATIENT_CLINIC_OR_DEPARTMENT_OTHER): Payer: Medicare Other | Admitting: Anesthesiology

## 2021-01-31 ENCOUNTER — Other Ambulatory Visit: Payer: Self-pay

## 2021-01-31 ENCOUNTER — Encounter (HOSPITAL_BASED_OUTPATIENT_CLINIC_OR_DEPARTMENT_OTHER)
Admission: RE | Disposition: A | Discharge status: Home / Self Care | Payer: Self-pay | Source: Home / Self Care | Attending: Plastic Surgery

## 2021-01-31 DIAGNOSIS — Z79899 Other long term (current) drug therapy: Secondary | ICD-10-CM | POA: Insufficient documentation

## 2021-01-31 DIAGNOSIS — M546 Pain in thoracic spine: Secondary | ICD-10-CM | POA: Insufficient documentation

## 2021-01-31 DIAGNOSIS — G8929 Other chronic pain: Secondary | ICD-10-CM | POA: Diagnosis not present

## 2021-01-31 DIAGNOSIS — I10 Essential (primary) hypertension: Secondary | ICD-10-CM | POA: Insufficient documentation

## 2021-01-31 DIAGNOSIS — D509 Iron deficiency anemia, unspecified: Secondary | ICD-10-CM | POA: Diagnosis not present

## 2021-01-31 DIAGNOSIS — Z7982 Long term (current) use of aspirin: Secondary | ICD-10-CM | POA: Insufficient documentation

## 2021-01-31 DIAGNOSIS — N62 Hypertrophy of breast: Secondary | ICD-10-CM | POA: Diagnosis not present

## 2021-01-31 DIAGNOSIS — M542 Cervicalgia: Secondary | ICD-10-CM | POA: Diagnosis not present

## 2021-01-31 HISTORY — PX: REDUCTION MAMMAPLASTY: SUR839

## 2021-01-31 HISTORY — PX: BREAST REDUCTION SURGERY: SHX8

## 2021-01-31 LAB — POCT PREGNANCY, URINE: Preg Test, Ur: NEGATIVE

## 2021-01-31 SURGERY — MAMMOPLASTY, REDUCTION
Anesthesia: General | Site: Breast | Laterality: Bilateral

## 2021-01-31 MED ORDER — HYDROCODONE-ACETAMINOPHEN 5-325 MG PO TABS
1.0000 | ORAL_TABLET | ORAL | Status: DC | PRN
  Administered 2021-02-01: 1 via ORAL
  Filled 2021-01-31: qty 1

## 2021-01-31 MED ORDER — AMISULPRIDE (ANTIEMETIC) 5 MG/2ML IV SOLN
INTRAVENOUS | Status: AC
  Filled 2021-01-31: qty 4

## 2021-01-31 MED ORDER — LIDOCAINE HCL (CARDIAC) PF 100 MG/5ML IV SOSY
PREFILLED_SYRINGE | INTRAVENOUS | Status: DC | PRN
  Administered 2021-01-31: 40 mg via INTRAVENOUS

## 2021-01-31 MED ORDER — PROPOFOL 10 MG/ML IV BOLUS
INTRAVENOUS | Status: DC | PRN
  Administered 2021-01-31: 140 mg via INTRAVENOUS

## 2021-01-31 MED ORDER — METHOCARBAMOL 500 MG PO TABS: 500.0000 mg | ORAL_TABLET | Freq: Four times a day (QID) | ORAL | Status: DC | PRN

## 2021-01-31 MED ORDER — ACETAMINOPHEN 325 MG PO TABS
325.0000 mg | ORAL_TABLET | Freq: Four times a day (QID) | ORAL | Status: DC
  Administered 2021-01-31 – 2021-02-01 (×2): 325 mg via ORAL
  Filled 2021-01-31 (×3): qty 1

## 2021-01-31 MED ORDER — FENTANYL CITRATE (PF) 100 MCG/2ML IJ SOLN
INTRAMUSCULAR | Status: AC
  Filled 2021-01-31: qty 2

## 2021-01-31 MED ORDER — NITROGLYCERIN 2 % TD OINT
0.5000 [in_us] | TOPICAL_OINTMENT | Freq: Four times a day (QID) | TRANSDERMAL | Status: DC
  Administered 2021-01-31 – 2021-02-01 (×2): 0.5 [in_us] via TOPICAL

## 2021-01-31 MED ORDER — ONDANSETRON HCL 4 MG/2ML IJ SOLN: INTRAMUSCULAR | Status: DC | PRN

## 2021-01-31 MED ORDER — SUGAMMADEX SODIUM 200 MG/2ML IV SOLN
INTRAVENOUS | Status: DC | PRN
  Administered 2021-01-31: 200 mg via INTRAVENOUS

## 2021-01-31 MED ORDER — MIDAZOLAM HCL 2 MG/2ML IJ SOLN
INTRAMUSCULAR | Status: AC
  Filled 2021-01-31: qty 2

## 2021-01-31 MED ORDER — PHENYLEPHRINE 40 MCG/ML (10ML) SYRINGE FOR IV PUSH (FOR BLOOD PRESSURE SUPPORT)
PREFILLED_SYRINGE | INTRAVENOUS | Status: AC
  Filled 2021-01-31: qty 10

## 2021-01-31 MED ORDER — ROCURONIUM BROMIDE 100 MG/10ML IV SOLN
INTRAVENOUS | Status: DC | PRN
  Administered 2021-01-31: 20 mg via INTRAVENOUS
  Administered 2021-01-31: 50 mg via INTRAVENOUS

## 2021-01-31 MED ORDER — ONDANSETRON HCL 4 MG/2ML IJ SOLN
4.0000 mg | Freq: Four times a day (QID) | INTRAMUSCULAR | Status: DC | PRN
  Administered 2021-01-31: 4 mg via INTRAVENOUS
  Filled 2021-01-31: qty 2

## 2021-01-31 MED ORDER — FENTANYL CITRATE (PF) 100 MCG/2ML IJ SOLN
INTRAMUSCULAR | Status: DC | PRN
  Administered 2021-01-31: 100 ug via INTRAVENOUS
  Administered 2021-01-31: 50 ug via INTRAVENOUS

## 2021-01-31 MED ORDER — ONDANSETRON HCL 4 MG/2ML IJ SOLN
INTRAMUSCULAR | Status: DC | PRN
  Administered 2021-01-31: 4 mg via INTRAVENOUS

## 2021-01-31 MED ORDER — ACETAMINOPHEN 500 MG PO TABS: 1000.0000 mg | ORAL_TABLET | Freq: Once | ORAL | Status: DC

## 2021-01-31 MED ORDER — DEXAMETHASONE SODIUM PHOSPHATE 4 MG/ML IJ SOLN
INTRAMUSCULAR | Status: DC | PRN
  Administered 2021-01-31: 5 mg via INTRAVENOUS

## 2021-01-31 MED ORDER — CEFAZOLIN SODIUM-DEXTROSE 2-4 GM/100ML-% IV SOLN
2.0000 g | Freq: Three times a day (TID) | INTRAVENOUS | Status: DC
  Administered 2021-01-31 – 2021-02-01 (×2): 2 g via INTRAVENOUS
  Filled 2021-01-31 (×2): qty 100

## 2021-01-31 MED ORDER — PHENYLEPHRINE HCL (PRESSORS) 10 MG/ML IV SOLN
INTRAVENOUS | Status: DC | PRN
  Administered 2021-01-31: 120 ug via INTRAVENOUS
  Administered 2021-01-31 (×2): 80 ug via INTRAVENOUS
  Administered 2021-01-31: 120 ug via INTRAVENOUS
  Administered 2021-01-31: 80 ug via INTRAVENOUS

## 2021-01-31 MED ORDER — CELECOXIB 200 MG PO CAPS: 200.0000 mg | ORAL_CAPSULE | Freq: Once | ORAL | Status: DC

## 2021-01-31 MED ORDER — LIDOCAINE-EPINEPHRINE 1 %-1:100000 IJ SOLN
INTRAMUSCULAR | Status: DC | PRN
  Administered 2021-01-31: 50 mL

## 2021-01-31 MED ORDER — ZOLPIDEM TARTRATE 5 MG PO TABS: 5.0000 mg | ORAL_TABLET | Freq: Every evening | ORAL | Status: DC | PRN

## 2021-01-31 MED ORDER — LIDOCAINE HCL (PF) 2 % IJ SOLN
INTRAMUSCULAR | Status: AC
  Filled 2021-01-31: qty 5

## 2021-01-31 MED ORDER — HYDROMORPHONE HCL 1 MG/ML IJ SOLN
1.0000 mg | INTRAMUSCULAR | Status: DC | PRN
  Administered 2021-01-31: 1 mg via INTRAVENOUS
  Filled 2021-01-31: qty 1

## 2021-01-31 MED ORDER — MIDAZOLAM HCL 5 MG/5ML IJ SOLN
INTRAMUSCULAR | Status: DC | PRN
  Administered 2021-01-31: 2 mg via INTRAVENOUS

## 2021-01-31 MED ORDER — CHLORHEXIDINE GLUCONATE CLOTH 2 % EX PADS: 6.0000 | MEDICATED_PAD | Freq: Once | CUTANEOUS | Status: DC

## 2021-01-31 MED ORDER — SENNA 8.6 MG PO TABS
1.0000 | ORAL_TABLET | Freq: Two times a day (BID) | ORAL | Status: DC
  Filled 2021-01-31: qty 1

## 2021-01-31 MED ORDER — AMISULPRIDE (ANTIEMETIC) 5 MG/2ML IV SOLN
10.0000 mg | Freq: Once | INTRAVENOUS | Status: AC | PRN
  Administered 2021-01-31: 10 mg via INTRAVENOUS

## 2021-01-31 MED ORDER — CEFAZOLIN SODIUM-DEXTROSE 2-4 GM/100ML-% IV SOLN
INTRAVENOUS | Status: AC
  Filled 2021-01-31: qty 100

## 2021-01-31 MED ORDER — EPHEDRINE 5 MG/ML INJ
INTRAVENOUS | Status: AC
  Filled 2021-01-31: qty 10

## 2021-01-31 MED ORDER — NITROGLYCERIN 2 % TD OINT
TOPICAL_OINTMENT | TRANSDERMAL | Status: AC
  Filled 2021-01-31: qty 30

## 2021-01-31 MED ORDER — ROCURONIUM BROMIDE 10 MG/ML (PF) SYRINGE
PREFILLED_SYRINGE | INTRAVENOUS | Status: AC
  Filled 2021-01-31: qty 10

## 2021-01-31 MED ORDER — PROPOFOL 500 MG/50ML IV EMUL
INTRAVENOUS | Status: DC | PRN
  Administered 2021-01-31: 25 ug/kg/min via INTRAVENOUS

## 2021-01-31 MED ORDER — DIPHENHYDRAMINE HCL 12.5 MG/5ML PO ELIX: 12.5000 mg | ORAL_SOLUTION | Freq: Four times a day (QID) | ORAL | Status: DC | PRN

## 2021-01-31 MED ORDER — DIPHENHYDRAMINE HCL 50 MG/ML IJ SOLN: 12.5000 mg | Freq: Four times a day (QID) | INTRAMUSCULAR | Status: DC | PRN

## 2021-01-31 MED ORDER — PROMETHAZINE HCL 25 MG/ML IJ SOLN: 6.2500 mg | INTRAMUSCULAR | Status: DC | PRN

## 2021-01-31 MED ORDER — IBUPROFEN 200 MG PO TABS
400.0000 mg | ORAL_TABLET | Freq: Four times a day (QID) | ORAL | Status: DC
  Administered 2021-01-31 – 2021-02-01 (×2): 400 mg via ORAL
  Filled 2021-01-31 (×2): qty 2

## 2021-01-31 MED ORDER — LACTATED RINGERS IV SOLN: INTRAVENOUS | Status: DC

## 2021-01-31 MED ORDER — PROPOFOL 500 MG/50ML IV EMUL
INTRAVENOUS | Status: AC
  Filled 2021-01-31: qty 50

## 2021-01-31 MED ORDER — POLYETHYLENE GLYCOL 3350 17 G PO PACK: 17.0000 g | PACK | Freq: Every day | ORAL | Status: DC | PRN

## 2021-01-31 MED ORDER — EPHEDRINE SULFATE 50 MG/ML IJ SOLN
INTRAMUSCULAR | Status: DC | PRN
  Administered 2021-01-31 (×3): 10 mg via INTRAVENOUS

## 2021-01-31 MED ORDER — FENTANYL CITRATE (PF) 100 MCG/2ML IJ SOLN: 25.0000 ug | INTRAMUSCULAR | Status: DC | PRN

## 2021-01-31 MED ORDER — ONDANSETRON 4 MG PO TBDP
4.0000 mg | ORAL_TABLET | Freq: Four times a day (QID) | ORAL | Status: DC | PRN
  Administered 2021-02-01: 4 mg via ORAL
  Filled 2021-01-31: qty 1

## 2021-01-31 MED ORDER — CEFAZOLIN SODIUM-DEXTROSE 2-4 GM/100ML-% IV SOLN
2.0000 g | INTRAVENOUS | Status: AC
  Administered 2021-01-31: 2 g via INTRAVENOUS

## 2021-01-31 SURGICAL SUPPLY — 68 items
ADH SKN CLS APL DERMABOND .7 (GAUZE/BANDAGES/DRESSINGS) ×2
BAG DECANTER FOR FLEXI CONT (MISCELLANEOUS) ×2 IMPLANT
BINDER BREAST LRG (GAUZE/BANDAGES/DRESSINGS) IMPLANT
BINDER BREAST MEDIUM (GAUZE/BANDAGES/DRESSINGS) IMPLANT
BINDER BREAST XLRG (GAUZE/BANDAGES/DRESSINGS) ×2 IMPLANT
BINDER BREAST XXLRG (GAUZE/BANDAGES/DRESSINGS) IMPLANT
BIOPATCH RED 1 DISK 7.0 (GAUZE/BANDAGES/DRESSINGS) IMPLANT
BLADE HEX COATED 2.75 (ELECTRODE) ×2 IMPLANT
BLADE KNIFE PERSONA 10 (BLADE) ×4 IMPLANT
BLADE SURG 15 STRL LF DISP TIS (BLADE) IMPLANT
BLADE SURG 15 STRL SS (BLADE)
BNDG GAUZE ELAST 4 BULKY (GAUZE/BANDAGES/DRESSINGS) IMPLANT
CANISTER SUCT 1200ML W/VALVE (MISCELLANEOUS) ×2 IMPLANT
COVER BACK TABLE 60X90IN (DRAPES) ×2 IMPLANT
COVER MAYO STAND STRL (DRAPES) ×2 IMPLANT
COVER WAND RF STERILE (DRAPES) IMPLANT
DECANTER SPIKE VIAL GLASS SM (MISCELLANEOUS) IMPLANT
DERMABOND ADVANCED (GAUZE/BANDAGES/DRESSINGS) ×2
DERMABOND ADVANCED .7 DNX12 (GAUZE/BANDAGES/DRESSINGS) ×2 IMPLANT
DRAIN CHANNEL 19F RND (DRAIN) IMPLANT
DRAPE LAPAROSCOPIC ABDOMINAL (DRAPES) ×2 IMPLANT
DRSG OPSITE POSTOP 4X6 (GAUZE/BANDAGES/DRESSINGS) ×2 IMPLANT
DRSG PAD ABDOMINAL 8X10 ST (GAUZE/BANDAGES/DRESSINGS) ×4 IMPLANT
ELECT BLADE 4.0 EZ CLEAN MEGAD (MISCELLANEOUS)
ELECT REM PT RETURN 9FT ADLT (ELECTROSURGICAL) ×2
ELECTRODE BLDE 4.0 EZ CLN MEGD (MISCELLANEOUS) IMPLANT
ELECTRODE REM PT RTRN 9FT ADLT (ELECTROSURGICAL) ×1 IMPLANT
EVACUATOR SILICONE 100CC (DRAIN) IMPLANT
GLOVE SURG ENC MOIS LTX SZ6.5 (GLOVE) ×8 IMPLANT
GLOVE SURG ENC MOIS LTX SZ7.5 (GLOVE) ×2 IMPLANT
GOWN STRL REUS W/ TWL LRG LVL3 (GOWN DISPOSABLE) ×2 IMPLANT
GOWN STRL REUS W/TWL LRG LVL3 (GOWN DISPOSABLE) ×4
NDL SAFETY ECLIPSE 18X1.5 (NEEDLE) IMPLANT
NEEDLE FILTER BLUNT 18X 1/2SAF (NEEDLE)
NEEDLE FILTER BLUNT 18X1 1/2 (NEEDLE) IMPLANT
NEEDLE HYPO 18GX1.5 SHARP (NEEDLE)
NEEDLE HYPO 25X1 1.5 SAFETY (NEEDLE) ×2 IMPLANT
NS IRRIG 1000ML POUR BTL (IV SOLUTION) ×2 IMPLANT
PACK BASIN DAY SURGERY FS (CUSTOM PROCEDURE TRAY) ×2 IMPLANT
PAD ALCOHOL SWAB (MISCELLANEOUS) IMPLANT
PAD FOAM SILICONE BACKED (GAUZE/BANDAGES/DRESSINGS) IMPLANT
PENCIL SMOKE EVACUATOR (MISCELLANEOUS) ×2 IMPLANT
PIN SAFETY STERILE (MISCELLANEOUS) IMPLANT
SLEEVE SCD COMPRESS KNEE MED (STOCKING) ×2 IMPLANT
SPONGE LAP 18X18 RF (DISPOSABLE) ×4 IMPLANT
STRIP SUTURE WOUND CLOSURE 1/2 (MISCELLANEOUS) ×4 IMPLANT
SUT MNCRL AB 4-0 PS2 18 (SUTURE) ×14 IMPLANT
SUT MON AB 3-0 SH 27 (SUTURE) ×14
SUT MON AB 3-0 SH27 (SUTURE) ×7 IMPLANT
SUT MON AB 5-0 PS2 18 (SUTURE) ×4 IMPLANT
SUT PDS 3-0 CT2 (SUTURE) ×8
SUT PDS AB 2-0 CT2 27 (SUTURE) IMPLANT
SUT PDS II 3-0 CT2 27 ABS (SUTURE) ×4 IMPLANT
SUT SILK 3 0 PS 1 (SUTURE) IMPLANT
SUT VIC AB 3-0 SH 27 (SUTURE)
SUT VIC AB 3-0 SH 27X BRD (SUTURE) IMPLANT
SUT VICRYL 4-0 PS2 18IN ABS (SUTURE) IMPLANT
SYR 50ML LL SCALE MARK (SYRINGE) IMPLANT
SYR BULB IRRIG 60ML STRL (SYRINGE) ×2 IMPLANT
SYR CONTROL 10ML LL (SYRINGE) ×2 IMPLANT
TAPE MEASURE VINYL STERILE (MISCELLANEOUS) IMPLANT
TOWEL GREEN STERILE FF (TOWEL DISPOSABLE) ×4 IMPLANT
TRAY DSU PREP LF (CUSTOM PROCEDURE TRAY) ×2 IMPLANT
TUBE CONNECTING 20X1/4 (TUBING) ×2 IMPLANT
TUBING INFILTRATION IT-10001 (TUBING) IMPLANT
TUBING SET GRADUATE ASPIR 12FT (MISCELLANEOUS) IMPLANT
UNDERPAD 30X36 HEAVY ABSORB (UNDERPADS AND DIAPERS) ×4 IMPLANT
YANKAUER SUCT BULB TIP NO VENT (SUCTIONS) ×2 IMPLANT

## 2021-01-31 NOTE — Transfer of Care (Signed)
Immediate Anesthesia Transfer of Care Note  Patient: Michelle Bell  Procedure(s) Performed: MAMMARY REDUCTION  (BREAST) (Bilateral Breast)  Patient Location: PACU  Anesthesia Type:General  Level of Consciousness: drowsy and patient cooperative  Airway & Oxygen Therapy: Patient Spontanous Breathing and Patient connected to face mask oxygen  Post-op Assessment: Report given to RN and Post -op Vital signs reviewed and stable  Post vital signs: Reviewed and stable  Last Vitals:  Vitals Value Taken Time  BP    Temp    Pulse    Resp    SpO2      Last Pain:  Vitals:   01/31/21 1053  TempSrc: Oral  PainSc: 0-No pain      Patients Stated Pain Goal: 4 (57/84/69 [id number])  Complications: No complications documented.

## 2021-01-31 NOTE — Anesthesia Preprocedure Evaluation (Addendum)
Anesthesia Evaluation  Patient identified by MRN, date of birth, ID band Patient awake    Reviewed: Allergy & Precautions, NPO status , Patient's Chart, lab work & pertinent test results  Airway Mallampati: II  TM Distance: >3 FB Neck ROM: Full    Dental  (+) Dental Advisory Given   Pulmonary neg pulmonary ROS,    breath sounds clear to auscultation       Cardiovascular hypertension, Pt. on medications  Rhythm:Regular Rate:Normal     Neuro/Psych negative neurological ROS     GI/Hepatic negative GI ROS, Neg liver ROS,   Endo/Other  negative endocrine ROS  Renal/GU negative Renal ROS     Musculoskeletal   Abdominal   Peds  Hematology negative hematology ROS (+)   Anesthesia Other Findings   Reproductive/Obstetrics                             Anesthesia Physical Anesthesia Plan  ASA: II  Anesthesia Plan: General   Post-op Pain Management:    Induction: Intravenous  PONV Risk Score and Plan: 3 and Midazolam, Dexamethasone, Ondansetron and Treatment may vary due to age or medical condition  Airway Management Planned: Oral ETT  Additional Equipment: None  Intra-op Plan:   Post-operative Plan: Extubation in OR  Informed Consent: I have reviewed the patients History and Physical, chart, labs and discussed the procedure including the risks, benefits and alternatives for the proposed anesthesia with the patient or authorized representative who has indicated his/her understanding and acceptance.     Dental advisory given  Plan Discussed with: CRNA  Anesthesia Plan Comments:         Anesthesia Quick Evaluation

## 2021-01-31 NOTE — Discharge Instructions (Signed)
INSTRUCTIONS FOR AFTER SURGERY   You will likely have some questions about what to expect following your operation.  The following information will help you and your family understand what to expect when you are discharged from the hospital.  Following these guidelines will help ensure a smooth recovery and reduce risks of complications.  Postoperative instructions include information on: diet, wound care, medications and physical activity.  AFTER SURGERY Expect to go home after the procedure.  In some cases, you may need to spend one night in the hospital for observation.  DIET This surgery does not require a specific diet.  However, I have to mention that the healthier you eat the better your body can start healing. It is important to increasing your protein intake.  This means limiting the foods with added sugar.  Focus on fruits and vegetables and some meat. It is very important to drink water after your surgery.  If your urine is bright yellow, then it is concentrated, and you need to drink more water.  As a general rule after surgery, you should have 8 ounces of water every hour while awake.  If you find you are persistently nauseated or unable to take in liquids let us know.  NO TOBACCO USE or EXPOSURE.  This will slow your healing process and increase the risk of a wound.  WOUND CARE If you don't have a drain: You can shower the day after surgery.  Use fragrance free soap.  Dial, [place]ona, [place] and Cetaphil are usually mild on the skin.  If you have a drain: Clean with baby wipes for 3-5 days and then you can shower.  If you have a binder you may remove it to shower and then put it back on. If you have steri-strips / tape directly attached to your skin leave them in place. It is OK to get these wet.  No baths, pools or hot tubs for two weeks. We close your incision to leave the smallest and best-looking scar. No ointment or creams on your incisions until given the go ahead.  Especially not  Neosporin (Too many skin reactions with this one).  A few weeks after surgery you can use Mederma and start massaging the scar. We ask you to wear your binder or sports bra for the first 6 weeks around the clock, including while sleeping. This provides added comfort and helps reduce the fluid accumulation at the surgery site.  ACTIVITY No heavy lifting until cleared by the doctor.  It is OK to walk and climb stairs. In fact, moving your legs is very important to decrease your risk of a blood clot.  It will also help keep you from getting deconditioned.  Every 1 to 2 hours get up and walk for 5 minutes. This will help with a quicker recovery back to normal.  Let pain be your guide so you don't do too much.  NO, you cannot do the spring cleaning and don't plan on taking care of anyone else.  This is your time for TLC.   WORK Everyone returns to work at different times. As a rough guide, most people take at least 1 - 2 weeks off prior to returning to work. If you need documentation for your job, bring the forms to your postoperative follow up visit.  DRIVING Arrange for someone to bring you home from the hospital.  You may be able to drive a few days after surgery but not while taking any narcotics or valium.  BOWEL  MOVEMENTS Constipation can occur after anesthesia and while taking pain medication.  It is important to stay ahead for your comfort.  We recommend taking Milk of Magnesia (2 tablespoons; twice a day) while taking the pain pills.  SEROMA This is fluid your body tried to put in the surgical site.  This is normal but if it creates excessive pain and swelling let us know.  It usually decreases in a few weeks.  MEDICATIONS and PAIN CONTROL At your preoperative visit for you history and physical you were given the following medications: 1. An antibiotic: Start this medication when you get home and take according to the instructions on the bottle. 2. Zofran 4 mg:  This is to treat nausea and  vomiting.  You can take this every 6 hours as needed and only if needed. 3. Norco (hydrocodone/acetaminophen) 5/325 mg:  This is only to be used after you have taken the motrin or the tylenol. Every 8 hours as needed. Over the counter Medication to take: 4. Ibuprofen (Motrin) 600 mg:  Take this every 6 hours.  If you have additional pain then take 500 mg of the tylenol.  Only take the Norco after you have tried these two. 5. Miralax or stool softener of choice: Take this according to the bottle if you take the [place] Call your surgeon's office if any of the following occur: . Fever 101 degrees F or greater . Excessive bleeding or fluid from the incision site. . Pain that increases over time without aid from the medications . Redness, warmth, or pus draining from incision sites . Persistent nausea or inability to take in liquids . Severe misshapen area that underwent the operation.

## 2021-01-31 NOTE — Anesthesia Postprocedure Evaluation (Signed)
Anesthesia Post Note  Patient: Michelle Bell  Procedure(s) Performed: MAMMARY REDUCTION  (BREAST) (Bilateral Breast)     Patient location during evaluation: PACU Anesthesia Type: General Level of consciousness: awake and alert Pain management: pain level controlled Vital Signs Assessment: post-procedure vital signs reviewed and stable Respiratory status: spontaneous breathing, nonlabored ventilation, respiratory function stable and patient connected to nasal cannula oxygen Cardiovascular status: blood pressure returned to baseline and stable Postop Assessment: no apparent nausea or vomiting Anesthetic complications: no   No complications documented.  Last Vitals:  Vitals:   01/31/21 1715 01/31/21 1741  BP: 125/80 (!) 126/91  Pulse: 100 99  Resp: 18 18  Temp:  36.4 C  SpO2: 96% 94%    Last Pain:  Vitals:   01/31/21 1741  TempSrc:   PainSc: Asleep                 Effie Berkshire

## 2021-01-31 NOTE — Interval H&P Note (Signed)
History and Physical Interval Note:  01/31/2021 1:03 PM  Michelle Bell  has presented today for surgery, with the diagnosis of mammary hypertrophy.  The various methods of treatment have been discussed with the patient and family. After consideration of risks, benefits and other options for treatment, the patient has consented to  Procedure(s): BREAST REDUCTION WITH LIPOSUCTION (Bilateral) as a surgical intervention.  The patient's history has been reviewed, patient examined, no change in status, stable for surgery.  I have reviewed the patient's chart and labs.  Questions were answered to the patient's satisfaction.     Loel Lofty Jahnavi Muratore

## 2021-01-31 NOTE — Op Note (Signed)
Breast Reduction Op note:    DATE OF PROCEDURE: 01/31/2021  LOCATION: [place]  SURGEON: Lyndee Leo Sanger Mikaeel Petrow, DO  ASSISTANT: Roetta Sessions, PA  PREOPERATIVE DIAGNOSIS 1. Macromastia 2. Neck Pain 3. Back Pain  POSTOPERATIVE DIAGNOSIS 1. Macromastia 2. Neck Pain 3. Back Pain  PROCEDURES 1. Bilateral breast reduction.  Right reduction 632 g, Left reduction 563 g  COMPLICATIONS: None.  DRAINS: none  INDICATIONS FOR PROCEDURE Michelle Bell is a 45 y.o. year-old female born on [birth year],with a history of symptomatic macromastia with concominant back pain, neck pain, shoulder grooving from her bra.   MRN: [id number]  CONSENT Informed consent was obtained directly from the patient. The risks, benefits and alternatives were fully discussed. Specific risks including but not limited to bleeding, infection, hematoma, seroma, scarring, pain, nipple necrosis, asymmetry, poor cosmetic results, and need for further surgery were discussed. The patient had ample opportunity to have her questions answered to her satisfaction.  DESCRIPTION OF PROCEDURE  Patient was brought into the operating room and placed in a supine position.  SCDs were placed and appropriate padding was performed.  Antibiotics were given. The patient underwent general anesthesia and the chest was prepped and draped in a sterile fashion.  A timeout was performed and all information was confirmed to be correct.  Right side: Preoperative markings were confirmed.  Incision lines were injected with local with epinephrine.  After waiting for vasoconstriction, the marked lines were incised.  A Wise-pattern superomedial breast reduction was performed by de-epithelializing the pedicle, using bovie to create the superomedial pedicle, and removing breast tissue from the lateral and inferior portions of the breast.  Care was taken to not undermine the breast pedicle. Hemostasis was achieved.  The nipple  was gently rotated into position and the soft tissue closed with 4-0 Monocryl.   The pocket was irrigated and hemostasis confirmed.  The deep tissues were approximated with 3-0 PDS and Monocryl sutures and the skin was closed with deep dermal and subcuticular 4-0 Monocryl sutures.  The nipple and skin flaps had good capillary refill at the end of the procedure.    Left side: Preoperative markings were confirmed.  Incision lines were injected with local with epinephrine.  After waiting for vasoconstriction, the marked lines were incised.  A Wise-pattern superomedial breast reduction was performed by de-epithelializing the pedicle, using bovie to create the superomedial pedicle, and removing breast tissue from the lateral and inferior portions of the breast.  Care was taken to not undermine the breast pedicle. Hemostasis was achieved.  The nipple was gently rotated into position and the soft tissue was closed with 4-0 Monocryl.  The patient was sat upright and size and shape symmetry was confirmed.  The pocket was irrigated and hemostasis confirmed.  The deep tissues were approximated with 3-0 PDS and Monocryl sutures and the skin was closed with deep dermal and subcuticular 4-0 Monocryl sutures.  Dermabond was applied.  A breast binder and ABDs were placed.  The nipple and skin flaps had good capillary refill at the end of the procedure.  The patient tolerated the procedure well. The patient was allowed to wake from anesthesia and taken to the recovery room in satisfactory condition.  The advanced practice practitioner (APP) assisted throughout the case.  The APP was essential in retraction and counter traction when needed to make the case progress smoothly.  This retraction and assistance made it possible to see the tissue plans for the procedure.  The assistance was needed for  blood control, tissue re-approximation and assisted with closure of the incision site.

## 2021-02-01 ENCOUNTER — Encounter (HOSPITAL_BASED_OUTPATIENT_CLINIC_OR_DEPARTMENT_OTHER): Payer: Self-pay | Admitting: Plastic Surgery

## 2021-02-01 DIAGNOSIS — M542 Cervicalgia: Secondary | ICD-10-CM | POA: Diagnosis not present

## 2021-02-01 DIAGNOSIS — Z7982 Long term (current) use of aspirin: Secondary | ICD-10-CM | POA: Diagnosis not present

## 2021-02-01 DIAGNOSIS — I10 Essential (primary) hypertension: Secondary | ICD-10-CM | POA: Diagnosis not present

## 2021-02-01 DIAGNOSIS — M546 Pain in thoracic spine: Secondary | ICD-10-CM | POA: Diagnosis not present

## 2021-02-01 DIAGNOSIS — G8929 Other chronic pain: Secondary | ICD-10-CM | POA: Diagnosis not present

## 2021-02-01 DIAGNOSIS — N62 Hypertrophy of breast: Secondary | ICD-10-CM | POA: Diagnosis not present

## 2021-02-01 MED ORDER — NITROGLYCERIN 2 % TD OINT: 0.5000 [in_us] | TOPICAL_OINTMENT | Freq: Four times a day (QID) | TRANSDERMAL | 0 refills | Status: AC

## 2021-02-01 NOTE — Discharge Summary (Signed)
Physician Discharge Summary  Patient ID: Michelle Bell MRN: [id number] DOB/AGE: Apr 01, 1976 45 y.o.  Admit date: 01/31/2021 Discharge date: 02/01/2021  Admission Diagnoses: Mammary hypertrophy  Discharge Diagnoses:  Active Problems:   Symptomatic mammary hypertrophy   Discharged Condition: good  Hospital Course: Patient was taken to the operating room and underwent bilateral breast reduction.  She did well postoperatively and was monitored overnight.  She had a little bit of nausea which cleared with medicine.  She was doing well 5 morning and tolerating food without difficulty.  Consults: None  Significant Diagnostic Studies: None  Treatments: surgery  Discharge Exam: Blood pressure 117/75, pulse 89, temperature 97.9 F (36.6 C), resp. rate 18, height 5\' 3"  (1.6 m), weight 90.8 kg, last menstrual period 01/16/2021, SpO2 92 %. General appearance: alert and cooperative Breasts: Good capillary refill no sign of a hematoma or seroma.  The breast binder seems a little tight so we will put her in a larger 1.  Disposition: Discharge disposition: 01-Home or Self Care       Discharge Instructions    Call MD for:  difficulty breathing, headache or visual disturbances   Complete by: As directed    Call MD for:  persistant nausea and vomiting   Complete by: As directed    Call MD for:  redness, tenderness, or signs of infection (pain, swelling, redness, odor or green/yellow discharge around incision site)   Complete by: As directed    Call MD for:  severe uncontrolled pain   Complete by: As directed    Call MD for:  temperature >100.4   Complete by: As directed    Diet general   Complete by: As directed    Driving Restrictions   Complete by: As directed    No driving while taking pain meds.   Increase activity slowly   Complete by: As directed    Lifting restrictions   Complete by: As directed    No more than 5 pounds.     Allergies as of 02/01/2021   No Known Allergies      Medication List    TAKE these medications   acetaminophen 325 MG tablet Commonly known as: TYLENOL Take 325 mg by mouth daily as needed for moderate pain.   amLODipine 5 MG tablet Commonly known as: NORVASC Take 5 mg by mouth daily.   aspirin EC 81 MG tablet Take 81 mg by mouth daily.   ferrous sulfate 325 (65 FE) MG tablet Take 325 mg by mouth daily with breakfast.   ibuprofen 200 MG tablet Commonly known as: ADVIL Take 200 mg by mouth daily as needed for headache or moderate pain.   multivitamin with minerals Tabs tablet Take 1 tablet by mouth daily.   nitroGLYCERIN 2 % ointment Commonly known as: NITROGLYN Apply 0.5 inches topically every 6 (six) hours.   ondansetron 4 MG tablet Commonly known as: Zofran Take 1 tablet (4 mg total) by mouth every 8 (eight) hours as needed for nausea or vomiting.       Follow-up Information    Kayce Chismar, Loel Lofty, DO In 10 days.   Specialty: Plastic Surgery Contact information: [address] [place] [address] [phone]               Signed: [place] 02/01/2021, 7:24 AM

## 2021-02-02 LAB — SURGICAL PATHOLOGY

## 2021-02-05 DIAGNOSIS — F330 Major depressive disorder, recurrent, mild: Secondary | ICD-10-CM | POA: Diagnosis not present

## 2021-02-09 ENCOUNTER — Ambulatory Visit (INDEPENDENT_AMBULATORY_CARE_PROVIDER_SITE_OTHER): Payer: Medicare Other | Admitting: Plastic Surgery

## 2021-02-09 ENCOUNTER — Encounter: Payer: Self-pay | Admitting: Plastic Surgery

## 2021-02-09 ENCOUNTER — Other Ambulatory Visit: Payer: Self-pay

## 2021-02-09 DIAGNOSIS — N62 Hypertrophy of breast: Secondary | ICD-10-CM

## 2021-02-09 NOTE — Progress Notes (Signed)
The patient is a 45 year old female here for follow-up on her bilateral breast reduction.  She is extremely happy with her results.  Appears to be doing well with all of the Steri-Strips were off.  I applied new ones.  The patient said that she did not have any allergy to these.  Her nipples appeared viable.  I like to see her back in 2 weeks.  Pictures were obtained of the patient and placed in the chart with the patient's or guardian's permission.

## 2021-02-21 DIAGNOSIS — F330 Major depressive disorder, recurrent, mild: Secondary | ICD-10-CM | POA: Diagnosis not present

## 2021-02-21 DIAGNOSIS — F900 Attention-deficit hyperactivity disorder, predominantly inattentive type: Secondary | ICD-10-CM | POA: Diagnosis not present

## 2021-02-27 ENCOUNTER — Ambulatory Visit (INDEPENDENT_AMBULATORY_CARE_PROVIDER_SITE_OTHER): Payer: Medicare Other | Admitting: Surgical

## 2021-02-27 ENCOUNTER — Other Ambulatory Visit: Payer: Self-pay

## 2021-02-27 DIAGNOSIS — M542 Cervicalgia: Secondary | ICD-10-CM

## 2021-02-27 DIAGNOSIS — M546 Pain in thoracic spine: Secondary | ICD-10-CM

## 2021-02-27 DIAGNOSIS — N62 Hypertrophy of breast: Secondary | ICD-10-CM

## 2021-02-27 DIAGNOSIS — G8929 Other chronic pain: Secondary | ICD-10-CM

## 2021-02-27 NOTE — Progress Notes (Signed)
Patient is a 45 year old female here for follow-up after bilateral breast reduction with Dr. Marla Roe on 01/31/2021.  She is 4 weeks postop.  She is overall doing well.  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  She is not having any infectious symptoms.  She does not report any pain.  Chaperone present on exam On exam bilateral NAC's are viable.  Bilateral breast incisions are intact.  She does have a few small wound noted along the left inframammary fold where sutures were present.  There is no foul odor or purulent drainage noted.  There is no surrounding erythema or cellulitic changes noted.  No subcutaneous fluid collection noted with palpation.   Recommend continue to wear compressive garment 24/7 for 2 more weeks.  Continue to avoid strenuous activities or heavy lifting greater than 15 pounds for 2 more weeks. Recommend following up in 2 to 3 months for reevaluation.  There is no sign of infection, seroma, hematoma.  Monocryl suture knots were removed today.  Patient tolerated this fine.  Recommend calling with questions or concerns.  Picture was taken and placed in the patient's chart with patient's permission.

## 2021-03-07 ENCOUNTER — Other Ambulatory Visit: Payer: Self-pay

## 2021-03-07 ENCOUNTER — Encounter: Payer: Self-pay | Admitting: Podiatry

## 2021-03-07 ENCOUNTER — Ambulatory Visit (INDEPENDENT_AMBULATORY_CARE_PROVIDER_SITE_OTHER): Payer: Medicare Other | Admitting: Podiatry

## 2021-03-07 DIAGNOSIS — Q6601 Congenital talipes equinovarus, right foot: Secondary | ICD-10-CM | POA: Diagnosis not present

## 2021-03-07 DIAGNOSIS — M19072 Primary osteoarthritis, left ankle and foot: Secondary | ICD-10-CM | POA: Diagnosis not present

## 2021-03-07 DIAGNOSIS — Q666 Other congenital valgus deformities of feet: Secondary | ICD-10-CM

## 2021-03-08 ENCOUNTER — Encounter: Payer: Self-pay | Admitting: Podiatry

## 2021-03-08 ENCOUNTER — Other Ambulatory Visit: Payer: Self-pay | Admitting: *Deleted

## 2021-03-08 ENCOUNTER — Telehealth: Payer: Self-pay | Admitting: *Deleted

## 2021-03-08 NOTE — Progress Notes (Signed)
Subjective:  Patient ID: Michelle Bell, female    DOB: 12-24-1975,  MRN: [id number]  Chief Complaint  Patient presents with   Foot Pain    Left foot pain  PT stated that she is still having issues with her foot     45 y.o. female presents with the above complaint.  Patient presents with follow-up of left talonavicular joint arthritis flare.  She states the steroid injection did help some she still has some swelling the pain is on and off.  She still has a pretty good amount of pain.  She did not get a phone call to schedule her CT scan.  She would like me to put the order in again.   Review of Systems: Negative except as noted in the HPI. Denies N/V/F/Ch.  Past Medical History:  Diagnosis Date   Anemia    Groin mass    left   History of blood transfusion    Hypertension     Current Outpatient Medications:    acetaminophen (TYLENOL) 325 MG tablet, Take 325 mg by mouth daily as needed for moderate pain., Disp: , Rfl:    amLODipine (NORVASC) 5 MG tablet, Take 5 mg by mouth daily., Disp: , Rfl:    aspirin EC 81 MG tablet, Take 81 mg by mouth daily., Disp: , Rfl:    ferrous sulfate 325 (65 FE) MG tablet, Take 325 mg by mouth daily with breakfast., Disp: , Rfl:    ibuprofen (ADVIL,MOTRIN) 200 MG tablet, Take 200 mg by mouth daily as needed for headache or moderate pain., Disp: , Rfl:    Multiple Vitamin (MULTIVITAMIN WITH MINERALS) TABS tablet, Take 1 tablet by mouth daily., Disp: , Rfl:    nitroGLYCERIN (NITROGLYN) 2 % ointment, Apply 0.5 inches topically every 6 (six) hours. (Patient not taking: Reported on 02/27/2021), Disp: 30 g, Rfl: 0   ondansetron (ZOFRAN) 4 MG tablet, Take 1 tablet (4 mg total) by mouth every 8 (eight) hours as needed for nausea or vomiting., Disp: 20 tablet, Rfl: 0  Social History   Tobacco Use  Smoking Status Never  Smokeless Tobacco Never    No Known Allergies Objective:  There were no vitals filed for this visit. There is no height or weight on  file to calculate BMI. Constitutional Well developed. Well nourished.  Vascular Dorsalis pedis pulses palpable bilaterally. Posterior tibial pulses palpable bilaterally. Capillary refill normal to all digits.  No cyanosis or clubbing noted. Pedal hair growth normal.  Neurologic Normal speech. Oriented to person, place, and time. Epicritic sensation to light touch grossly present bilaterally.  Dermatologic Nails well groomed and normal in appearance. No open wounds. No skin lesions.  Orthopedic:  Pain on palpation left talonavicular joint.  Pain with range of motion of the joint.  Mild crepitus noted.  Mild pain at the subtalar joint.  Pain with range of motion of the subtalar joint.  No crepitus clinically appreciated.  No pain along the course of posterior tibial tendon peroneal tendon Achilles tendon noted.  Positive Silfverskiold test noted gastrocnemius equinus   Radiographs: None Assessment:   1. Primary osteoarthritis of left ankle   2. Pes planovalgus     Plan:  Patient was evaluated and treated and all questions answered.  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  She would like to proceed with that. -A steroid injection was performed at left talonavicular joint using 1% plain Lidocaine and 10 mg of Kenalog. This was well tolerated. .  She states that she still has pain on and off.  She has severe flatfoot deformity with concern for underlying osteoarthritic changes to the subtalar joint as well as talonavicular joint.  At this time I discussed with the patient in extensive detail given her foot structure would not resolve meant pain she may benefit from a CT scan to evaluate the joints of her foot.  She has had  longstanding pes planovalgus which could have resulted to arthritic changes in her subtalar joint as well as talonavicular joint.   -I will reorder a CT scan. -I discussed with her that if she continues to not have resolve meant I believe she will benefit from surgical intervention which involves fusing multiple hindfoot joints.  I will see what the CT scan shows and we will proceed with further planning based off of that.  She states understanding  No follow-ups on file.

## 2021-03-08 NOTE — Telephone Encounter (Signed)
Called [facility] and they said that they have tried to call patient twice and she did not return their call to schedule.  Called patient ,no answer, left vmessage.03/08/21.

## 2021-03-09 ENCOUNTER — Telehealth: Payer: Self-pay | Admitting: *Deleted

## 2021-03-09 NOTE — Telephone Encounter (Signed)
-----   Message from Felipa Furnace, DPM sent at 03/08/2021  1:13 PM EDT ----- Regarding: Prior authorization for CT scan [facility],   So this patient CT scan was not scheduled.  I am not sure if they need a prior authorization to.  Can you reach out to [facility] to see if they can schedule this patient or if they need a prior authorization.  Thank you

## 2021-03-09 NOTE — Telephone Encounter (Signed)
Called patient and explained that [facility] imaging was trying to reach to schedule an appointment. She said that she would call them on Monday(03/12/21) to schedule the CT scan.

## 2021-03-09 NOTE — Telephone Encounter (Signed)
Called patient and she said that she  will call [facility] to schedule on 03/12/21.

## 2021-03-13 DIAGNOSIS — F330 Major depressive disorder, recurrent, mild: Secondary | ICD-10-CM | POA: Diagnosis not present

## 2021-03-15 ENCOUNTER — Telehealth: Payer: Self-pay | Admitting: Podiatry

## 2021-03-15 NOTE — Telephone Encounter (Signed)
This is the correct patient- She called and stated that she wanted to follow up on a CT scan that was ordered and she never got a call to get scheduled

## 2021-03-16 DIAGNOSIS — D5 Iron 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): Secondary | ICD-10-CM | POA: Diagnosis not present

## 2021-03-16 DIAGNOSIS — R202 Paresthesia of skin: Secondary | ICD-10-CM | POA: Diagnosis not present

## 2021-03-16 DIAGNOSIS — N920 Excessive and frequent menstruation with regular cycle: Secondary | ICD-10-CM | POA: Diagnosis not present

## 2021-03-16 DIAGNOSIS — I10 Essential (primary) hypertension: Secondary | ICD-10-CM | POA: Diagnosis not present

## 2021-03-16 DIAGNOSIS — Z87891 Personal history of nicotine dependence: Secondary | ICD-10-CM | POA: Diagnosis not present

## 2021-03-16 DIAGNOSIS — E782 Mixed hyperlipidemia: Secondary | ICD-10-CM | POA: Diagnosis not present

## 2021-03-20 ENCOUNTER — Ambulatory Visit
Admission: RE | Admit: 2021-03-20 | Discharge: 2021-03-20 | Disposition: A | Discharge status: Home / Self Care | Payer: Medicare Other | Source: Ambulatory Visit | Attending: Podiatry | Admitting: Podiatry

## 2021-03-20 DIAGNOSIS — M19072 Primary osteoarthritis, left ankle and foot: Secondary | ICD-10-CM

## 2021-03-20 DIAGNOSIS — R600 Localized edema: Secondary | ICD-10-CM | POA: Diagnosis not present

## 2021-03-20 DIAGNOSIS — Q666 Other congenital valgus deformities of feet: Secondary | ICD-10-CM | POA: Diagnosis not present

## 2021-03-26 DIAGNOSIS — F330 Major depressive disorder, recurrent, mild: Secondary | ICD-10-CM | POA: Diagnosis not present

## 2021-03-30 ENCOUNTER — Ambulatory Visit (INDEPENDENT_AMBULATORY_CARE_PROVIDER_SITE_OTHER): Payer: Medicare Other | Admitting: Podiatry

## 2021-03-30 ENCOUNTER — Other Ambulatory Visit: Payer: Self-pay

## 2021-03-30 DIAGNOSIS — Q666 Other congenital valgus deformities of feet: Secondary | ICD-10-CM

## 2021-03-30 DIAGNOSIS — M19072 Primary osteoarthritis, left ankle and foot: Secondary | ICD-10-CM | POA: Diagnosis not present

## 2021-03-30 DIAGNOSIS — M79672 Pain in left foot: Secondary | ICD-10-CM

## 2021-04-04 NOTE — Progress Notes (Signed)
Subjective:  Patient ID: Michelle Bell, female    DOB: [birth year],  MRN: [id number]  Chief Complaint  Patient presents with   Arthritis    PT stated that she is still having some pain with her foot     45 y.o. female presents with the above complaint.  P patient presents with continuous pain to the left talonavicular joint as well as the arch of the foot.  Patient states the pain is still the same the injection has stopped working.  She would like to discuss treatment options she is here to discuss her CT scan as well.   Review of Systems: Negative except as noted in the HPI. Denies N/V/F/Ch.  Past Medical History:  Diagnosis Date   Anemia    Groin mass    left   History of blood transfusion    Hypertension     Current Outpatient Medications:    acetaminophen (TYLENOL) 325 MG tablet, Take 325 mg by mouth daily as needed for moderate pain., Disp: , Rfl:    amLODipine (NORVASC) 5 MG tablet, Take 5 mg by mouth daily., Disp: , Rfl:    aspirin EC 81 MG tablet, Take 81 mg by mouth daily., Disp: , Rfl:    ferrous sulfate 325 (65 FE) MG tablet, Take 325 mg by mouth daily with breakfast., Disp: , Rfl:    ibuprofen (ADVIL,MOTRIN) 200 MG tablet, Take 200 mg by mouth daily as needed for headache or moderate pain., Disp: , Rfl:    Multiple Vitamin (MULTIVITAMIN WITH MINERALS) TABS tablet, Take 1 tablet by mouth daily., Disp: , Rfl:    nitroGLYCERIN (NITROGLYN) 2 % ointment, Apply 0.5 inches topically every 6 (six) hours. (Patient not taking: Reported on 02/27/2021), Disp: 30 g, Rfl: 0   ondansetron (ZOFRAN) 4 MG tablet, Take 1 tablet (4 mg total) by mouth every 8 (eight) hours as needed for nausea or vomiting., Disp: 20 tablet, Rfl: 0  Social History   Tobacco Use  Smoking Status Never  Smokeless Tobacco Never    No Known Allergies Objective:  There were no vitals filed for this visit. There is no height or weight on file to calculate BMI. Constitutional Well developed. Well  nourished.  Vascular Dorsalis pedis pulses palpable bilaterally. Posterior tibial pulses palpable bilaterally. Capillary refill normal to all digits.  No cyanosis or clubbing noted. Pedal hair growth normal.  Neurologic Normal speech. Oriented to person, place, and time. Epicritic sensation to light touch grossly present bilaterally.  Dermatologic Nails well groomed and normal in appearance. No open wounds. No skin lesions.  Orthopedic:  Pain on palpation left talonavicular joint.  Pain with range of motion of the joint.  Mild crepitus noted.   pain at the subtalar joint.  Pain with range of motion of the subtalar joint.  No crepitus clinically appreciated.  No pain along the course of posterior tibial tendon peroneal tendon Achilles tendon noted.  Positive Silfverskiold test noted gastrocnemius equinus.  Pes planus valgus foot structure noted.   1. Pes planovalgus alignment with moderate arthropathy involving the subtalar and talonavicular joints. 2. Large os trigonum with surrounding fluid. Correlate for posterior ankle impingement. 3. Superomedial aspect of the spring ligament appears thickened and becomes ill-defined at the level of the talar head, and may be torn. Assessment:   1. Primary osteoarthritis of left ankle   2. Pes planovalgus   3. Foot pain, left      Plan:  Patient was evaluated and treated and all questions answered.  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  I reviewed the CT scan with her which showed severe talonavicular joint arthritis with subtalar joint arthritis with primary involvement.  I discussed my findings with the setting of pes planovalgus I discussed with the patient that she would benefit from surgical intervention at this time.  She would like to think about the surgery and revisit sometimes in fall or winter when  she can take the time off of work. -She has failed all conservative treatment options including injection shoe gear modification and orthotics at this time.  I will rediscussed the surgical options during next clinical visit.  No follow-ups on file.

## 2021-04-09 DIAGNOSIS — F900 Attention-deficit hyperactivity disorder, predominantly inattentive type: Secondary | ICD-10-CM | POA: Diagnosis not present

## 2021-04-09 DIAGNOSIS — F330 Major depressive disorder, recurrent, mild: Secondary | ICD-10-CM | POA: Diagnosis not present

## 2021-04-13 DIAGNOSIS — N920 Excessive and frequent menstruation with regular cycle: Secondary | ICD-10-CM | POA: Diagnosis not present

## 2021-04-13 DIAGNOSIS — E782 Mixed hyperlipidemia: Secondary | ICD-10-CM | POA: Diagnosis not present

## 2021-04-13 DIAGNOSIS — R202 Paresthesia of skin: Secondary | ICD-10-CM | POA: Diagnosis not present

## 2021-04-13 DIAGNOSIS — Z87891 Personal history of nicotine dependence: Secondary | ICD-10-CM | POA: Diagnosis not present

## 2021-04-13 DIAGNOSIS — D5 Iron 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): Secondary | ICD-10-CM | POA: Diagnosis not present

## 2021-04-13 DIAGNOSIS — I10 Essential (primary) hypertension: Secondary | ICD-10-CM | POA: Diagnosis not present

## 2021-04-24 DIAGNOSIS — F330 Major depressive disorder, recurrent, mild: Secondary | ICD-10-CM | POA: Diagnosis not present

## 2021-04-30 NOTE — Progress Notes (Deleted)
Patient is a 45 year old female s/p bilateral breast reduction performed 01/31/2021 by Dr. Marla Roe.    I reviewed medical record and she was last seen in clinic 02/27/2021.  At that time, she was 4 weeks postop and doing well.  Recommendation was for continued compressive garments and activity modification x2 weeks.  Physical exam is entirely reassuring.  Pictures were obtained and placed in chart.  On my examination, ***.  No restrictions.

## 2021-05-01 ENCOUNTER — Ambulatory Visit: Payer: Medicare Other | Admitting: Physician Assistant

## 2021-05-14 DIAGNOSIS — F900 Attention-deficit hyperactivity disorder, predominantly inattentive type: Secondary | ICD-10-CM | POA: Diagnosis not present

## 2021-05-14 DIAGNOSIS — F330 Major depressive disorder, recurrent, mild: Secondary | ICD-10-CM | POA: Diagnosis not present

## 2021-05-22 ENCOUNTER — Other Ambulatory Visit: Payer: Self-pay | Admitting: Physician Assistant

## 2021-05-22 DIAGNOSIS — Z1231 Encounter for screening mammogram for malignant neoplasm of breast: Secondary | ICD-10-CM

## 2021-05-31 NOTE — Progress Notes (Signed)
Referring Provider Trey Sailors, PA [address] [place],  [place] [address]   CC: No chief complaint on file.     Michelle Bell is an 45 y.o. female.  HPI: Patient is a 45 year old female with PMH of bilateral breast reduction performed by Dr. Marla Roe 01/31/2021.  She was last seen here in clinic on 02/27/2021 for her 4-week postop exam.  At that time, she was noted to have a few small wounds along left inframammary fold, but no cellulitic findings.  Her exam was otherwise reassuring.  Plan was for continued compressive garments for an additional 2 weeks and then follow-up in 2 to 3 months for reevaluation.  Today, patient is doing well, without any significant complaints.  She denies any pain symptoms, drainage, fevers, redness, swelling.  She states that she is not sure if she still has persistent wounds that were noted at prior encounter.  She also is curious as to whether or not we could help her determine her new bra size.  She is overall very pleased with her results.  She states that her back and neck pain have improved considerably.   No Known Allergies  Outpatient Encounter Medications as of 06/05/2021  Medication Sig   acetaminophen (TYLENOL) 325 MG tablet Take 325 mg by mouth daily as needed for moderate pain.   amLODipine (NORVASC) 5 MG tablet Take 5 mg by mouth daily.   aspirin EC 81 MG tablet Take 81 mg by mouth daily.   ferrous sulfate 325 (65 FE) MG tablet Take 325 mg by mouth daily with breakfast.   ibuprofen (ADVIL,MOTRIN) 200 MG tablet Take 200 mg by mouth daily as needed for headache or moderate pain.   Multiple Vitamin (MULTIVITAMIN WITH MINERALS) TABS tablet Take 1 tablet by mouth daily.   nitroGLYCERIN (NITROGLYN) 2 % ointment Apply 0.5 inches topically every 6 (six) hours. (Patient not taking: Reported on 02/27/2021)   ondansetron (ZOFRAN) 4 MG tablet Take 1 tablet (4 mg total) by mouth every 8 (eight) hours as needed for nausea or vomiting.   No  facility-administered encounter medications on file as of 06/05/2021.     Past Medical History:  Diagnosis Date   Anemia    Groin mass    left   History of blood transfusion    Hypertension     Past Surgical History:  Procedure Laterality Date   BREAST REDUCTION SURGERY Bilateral 01/31/2021   Procedure: MAMMARY REDUCTION  (BREAST);  Surgeon: Wallace Going, DO;  Location: [facility];  Service: Plastics;  Laterality: Bilateral;   CESAREAN SECTION     MASS EXCISION Left 12/23/2016   Procedure: EXCISION LEFT GROIN MASS;  Surgeon: Coralie Keens, MD;  Location: [facility];  Service: General;  Laterality: Left;    Family History  Problem Relation Age of Onset   Breast cancer Maternal Aunt     Social History   Social History Narrative   Not on file     Review of Systems General: Denies fevers or chills Cardio: Denies chest pain Pulmonary: Denies difficulty breathing  Physical Exam Vitals with BMI 02/01/2021 02/01/2021 02/01/2021  Height - - -  Weight - - -  BMI - - -  Systolic - - -  Diastolic - - -  Pulse 87 88 89    General:  No acute distress, nontoxic appearing  Respiratory: No increased work of breathing Neuro: Alert and oriented Psychiatric: Normal mood and affect   Assessment/Plan  Patient is approximately  17 weeks postop for her bilateral breast reduction.  There are no wounds on my exam.  No cellulitic findings.  Good symmetry and contour.  We reviewed her before and after photos and she is very pleased with her results.  For determination of her new bra size, recommend that she go to an outlet store for formal measurements.  No activity restrictions at this time.  We do asked that she postpone her mammogram until she is at least 6 months postop.  She can call the office should she have any questions or concerns, otherwise cleared from our standpoint.  Any pictures obtained of the patient and placed in the chart were with the patient's or  guardian's permission.   Krista Blue 05/31/2021, 2:59 PM

## 2021-06-05 ENCOUNTER — Ambulatory Visit (INDEPENDENT_AMBULATORY_CARE_PROVIDER_SITE_OTHER): Payer: Medicare Other | Admitting: Physician Assistant

## 2021-06-05 ENCOUNTER — Other Ambulatory Visit: Payer: Self-pay

## 2021-06-05 DIAGNOSIS — Z9889 Other specified postprocedural states: Secondary | ICD-10-CM | POA: Diagnosis not present

## 2021-06-07 DIAGNOSIS — F900 Attention-deficit hyperactivity disorder, predominantly inattentive type: Secondary | ICD-10-CM | POA: Diagnosis not present

## 2021-06-07 DIAGNOSIS — F330 Major depressive disorder, recurrent, mild: Secondary | ICD-10-CM | POA: Diagnosis not present

## 2021-06-27 ENCOUNTER — Ambulatory Visit: Payer: Medicare Other

## 2021-07-13 ENCOUNTER — Encounter: Payer: Self-pay | Admitting: Podiatry

## 2021-07-13 ENCOUNTER — Other Ambulatory Visit: Payer: Self-pay

## 2021-07-13 ENCOUNTER — Ambulatory Visit (INDEPENDENT_AMBULATORY_CARE_PROVIDER_SITE_OTHER): Payer: Medicare Other

## 2021-07-13 ENCOUNTER — Ambulatory Visit (INDEPENDENT_AMBULATORY_CARE_PROVIDER_SITE_OTHER): Payer: Medicare Other | Admitting: Podiatry

## 2021-07-13 DIAGNOSIS — M24572 Contracture, left ankle: Secondary | ICD-10-CM | POA: Diagnosis not present

## 2021-07-13 DIAGNOSIS — M19072 Primary osteoarthritis, left ankle and foot: Secondary | ICD-10-CM

## 2021-07-13 DIAGNOSIS — M19079 Primary osteoarthritis, unspecified ankle and foot: Secondary | ICD-10-CM | POA: Diagnosis not present

## 2021-07-13 DIAGNOSIS — Z01818 Encounter for other preprocedural examination: Secondary | ICD-10-CM

## 2021-07-13 NOTE — Progress Notes (Signed)
Subjective:  Patient ID: Michelle Bell, female    DOB: October 10, 1975,  MRN: [id number]  Chief Complaint  Patient presents with   Arthritis    PT stated that she still has some pain    45 y.o. female presents with the above complaint.  P patient presents with continuous pain to the left talonavicular joint as well as the arch of the foot.  Patient states the pain is still the same the injection has stopped working.  She would like to discuss treatment options she is here to discuss her CT scan as well.   Review of Systems: Negative except as noted in the HPI. Denies N/V/F/Ch.  Past Medical History:  Diagnosis Date   Anemia    Groin mass    left   History of blood transfusion    Hypertension     Current Outpatient Medications:    acetaminophen (TYLENOL) 325 MG tablet, Take 325 mg by mouth daily as needed for moderate pain., Disp: , Rfl:    amLODipine (NORVASC) 5 MG tablet, Take 5 mg by mouth daily., Disp: , Rfl:    aspirin EC 81 MG tablet, Take 81 mg by mouth daily., Disp: , Rfl:    ferrous sulfate 325 (65 FE) MG tablet, Take 325 mg by mouth daily with breakfast., Disp: , Rfl:    ibuprofen (ADVIL,MOTRIN) 200 MG tablet, Take 200 mg by mouth daily as needed for headache or moderate pain., Disp: , Rfl:    Multiple Vitamin (MULTIVITAMIN WITH MINERALS) TABS tablet, Take 1 tablet by mouth daily., Disp: , Rfl:    nitroGLYCERIN (NITROGLYN) 2 % ointment, Apply 0.5 inches topically every 6 (six) hours. (Patient not taking: No sig reported), Disp: 30 g, Rfl: 0   ondansetron (ZOFRAN) 4 MG tablet, Take 1 tablet (4 mg total) by mouth every 8 (eight) hours as needed for nausea or vomiting., Disp: 20 tablet, Rfl: 0  Social History   Tobacco Use  Smoking Status Never  Smokeless Tobacco Never    No Known Allergies Objective:  There were no vitals filed for this visit. There is no height or weight on file to calculate BMI. Constitutional Well developed. Well nourished.  Vascular Dorsalis  pedis pulses palpable bilaterally. Posterior tibial pulses palpable bilaterally. Capillary refill normal to all digits.  No cyanosis or clubbing noted. Pedal hair growth normal.  Neurologic Normal speech. Oriented to person, place, and time. Epicritic sensation to light touch grossly present bilaterally.  Dermatologic Nails well groomed and normal in appearance. No open wounds. No skin lesions.  Orthopedic:  Pain on palpation left talonavicular joint.  Pain with range of motion of the joint.  Mild crepitus noted.   pain at the subtalar joint.  Pain with range of motion of the subtalar joint.  No crepitus clinically appreciated.  No pain along the course of posterior tibial tendon peroneal tendon Achilles tendon noted.  Positive Silfverskiold test noted gastrocnemius equinus.  Pes planus valgus foot structure noted.   1. Pes planovalgus alignment with moderate arthropathy involving the subtalar and talonavicular joints. 2. Large os trigonum with surrounding fluid. Correlate for posterior ankle impingement. 3. Superomedial aspect of the spring ligament appears thickened and becomes ill-defined at the level of the talar head, and may be torn. Assessment:   1. Primary osteoarthritis of left ankle      Plan:  Patient was evaluated and treated and all questions answered.  Left talonavicular joint arthritis with a possible sinus tarsi syndrome/subtalar joint arthritis with underlying gastrocnemius  recession -All questions or concerns were discussed with the patient in extensive detail.  I reviewed the CT scan with her which showed severe talonavicular joint arthritis with subtalar joint arthritis with primary involvement.  I discussed my findings with the setting of pes planovalgus I discussed with the patient that she would benefit from surgical intervention at this time.   -Clinically she has had no improvement and has progressed to gotten worse with severe pain to the left foot.  At this  time I read discussed surgical intervention where she will benefit from double arthrodesis with subtalar joint arthritis as well as talonavicular joint arthritis with gastrocnemius recession.  I discussed my preoperative intraoperative postoperative plan in extensive detail.  I discussed with the patient that she will have to be nonweightbearing for 6 to 8 weeks followed by weightbearing as tolerated.  The entire recovery can take anywhere 3 to 4 months.  I discussed with the patient in extensive detail the surgical incision plan as well as all the recovery that is required.  Patient states understand would like to proceed with surgery. -Informed surgical risk consent was reviewed and read aloud to the patient.  I reviewed the films.  I have discussed my findings with the patient in great detail.  I have discussed all risks including but not limited to infection, stiffness, scarring, limp, disability, deformity, damage to blood vessels and nerves, numbness, poor healing, need for braces, arthritis, chronic pain, amputation, death.  All benefits and realistic expectations discussed in great detail.  I have made no promises as to the outcome.  I have provided realistic expectations.  I have offered the patient a 2nd opinion, which they have declined and assured me they preferred to proceed despite the risks -We will plan on adding gastrocnemius recession to the surgical consent and I will discuss this with the patient before the OR  No follow-ups on file.

## 2021-08-08 DIAGNOSIS — F330 Major depressive disorder, recurrent, mild: Secondary | ICD-10-CM | POA: Diagnosis not present

## 2021-08-14 DIAGNOSIS — Z23 Encounter for immunization: Secondary | ICD-10-CM | POA: Diagnosis not present

## 2021-08-14 DIAGNOSIS — N920 Excessive and frequent menstruation with regular cycle: Secondary | ICD-10-CM | POA: Diagnosis not present

## 2021-08-14 DIAGNOSIS — Z87891 Personal history of nicotine dependence: Secondary | ICD-10-CM | POA: Diagnosis not present

## 2021-08-14 DIAGNOSIS — I10 Essential (primary) hypertension: Secondary | ICD-10-CM | POA: Diagnosis not present

## 2021-08-14 DIAGNOSIS — E782 Mixed hyperlipidemia: Secondary | ICD-10-CM | POA: Diagnosis not present

## 2021-08-14 DIAGNOSIS — D5 Iron 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): Secondary | ICD-10-CM | POA: Diagnosis not present

## 2021-08-14 DIAGNOSIS — R202 Paresthesia of skin: Secondary | ICD-10-CM | POA: Diagnosis not present

## 2021-08-23 ENCOUNTER — Ambulatory Visit
Admission: RE | Admit: 2021-08-23 | Discharge: 2021-08-23 | Disposition: A | Discharge status: Home / Self Care | Payer: Medicare Other | Source: Ambulatory Visit | Attending: Physician Assistant | Admitting: Physician Assistant

## 2021-08-23 DIAGNOSIS — Z1231 Encounter for screening mammogram for malignant neoplasm of breast: Secondary | ICD-10-CM

## 2021-08-28 DIAGNOSIS — F330 Major depressive disorder, recurrent, mild: Secondary | ICD-10-CM | POA: Diagnosis not present

## 2021-08-31 ENCOUNTER — Encounter (HOSPITAL_COMMUNITY): Payer: Self-pay | Admitting: Emergency Medicine

## 2021-08-31 ENCOUNTER — Emergency Department (HOSPITAL_COMMUNITY)
Admission: EM | Admit: 2021-08-31 | Discharge: 2021-08-31 | Disposition: A | Discharge status: Home / Self Care | Payer: Medicare Other | Attending: Emergency Medicine | Admitting: Emergency Medicine

## 2021-08-31 ENCOUNTER — Other Ambulatory Visit: Payer: Self-pay

## 2021-08-31 DIAGNOSIS — R4589 Other symptoms and signs involving emotional state: Secondary | ICD-10-CM | POA: Diagnosis not present

## 2021-08-31 DIAGNOSIS — R030 Elevated blood-pressure reading, without diagnosis of hypertension: Secondary | ICD-10-CM | POA: Diagnosis present

## 2021-08-31 DIAGNOSIS — Z743 Need for continuous supervision: Secondary | ICD-10-CM | POA: Diagnosis not present

## 2021-08-31 DIAGNOSIS — I10 Essential (primary) hypertension: Secondary | ICD-10-CM | POA: Diagnosis not present

## 2021-08-31 DIAGNOSIS — Z7982 Long term (current) use of aspirin: Secondary | ICD-10-CM | POA: Insufficient documentation

## 2021-08-31 DIAGNOSIS — Z79899 Other long term (current) drug therapy: Secondary | ICD-10-CM | POA: Insufficient documentation

## 2021-08-31 LAB — BASIC METABOLIC PANEL
Anion gap: 6 (ref 5–15)
BUN: 5 mg/dL — ABNORMAL LOW (ref 6–20)
CO2: 22 mmol/L (ref 22–32)
Calcium: 9.1 mg/dL (ref 8.9–10.3)
Chloride: 106 mmol/L (ref 98–111)
Creatinine, Ser: 0.57 mg/dL (ref 0.44–1.00)
GFR, Estimated: >60 mL/min (ref >60)
Glucose, Bld: 94 mg/dL (ref 70–99)
Potassium: 3.3 mmol/L — ABNORMAL LOW (ref 3.5–5.1)
Sodium: 134 mmol/L — ABNORMAL LOW (ref 135–145)

## 2021-08-31 LAB — CBC
HCT: 36.2 % (ref 36.0–46.0)
Hemoglobin: 11.3 g/dL — ABNORMAL LOW (ref 12.0–15.0)
MCH: 28.7 pg (ref 26.0–34.0)
MCHC: 31.2 g/dL (ref 30.0–36.0)
MCV: 91.9 fL (ref 80.0–100.0)
Platelets: 419 10*3/uL — ABNORMAL HIGH (ref 150–400)
RBC: 3.94 MIL/uL (ref 3.87–5.11)
RDW: 13.9 % (ref 11.5–15.5)
WBC: 6.2 10*3/uL (ref 4.0–10.5)
nRBC: 0 % (ref 0.0–0.2)

## 2021-08-31 NOTE — Discharge Instructions (Addendum)
Please follow up with your PCP for further evaluation of your elevated blood pressure reading in the ED  Continue taking your blood pressure medication daily as indicated  Return to the ED for any new/worsening symptoms

## 2021-08-31 NOTE — ED Provider Notes (Signed)
[facility] EM[facility]ENC[facility] DEPART[facility] Provider Note   CSN: [id number] Arrival date & time: 08/31/21  1012     History Chief Complaint  Patient presents with   Hypertension    Michelle Bell is a 45 y.o. female with PMHx HTN who presents to the ED today with complaint of elevated blood pressure reading.  Patient reports that she is compliant with amlodipine.  She states that this morning she began having heart palpitations couple of hours after taking her amlodipine.  She checked her blood pressure and reports that it was elevated prompting EMS call.  With EMS her blood pressure was 142/110.  Patient states that she questions if she is having heart palpitations related to her iron being low.  She states that she has a history of iron deficiency anemia however is compliant with iron supplements.  She denies any chest pain, shortness of breath, nausea, vomiting, headache, vision changes, speech changes, unilateral weakness or numbness, any other associated symptoms.  The history is provided by the patient and medical records.      Past Medical History:  Diagnosis Date   Anemia    Groin mass    left   History of blood transfusion    Hypertension     Patient Active Problem List   Diagnosis Date Noted   Back pain 06/09/2020   Neck pain 06/09/2020   Symptomatic mammary hypertrophy 06/09/2020   Iron deficiency anemia 10/14/2018    Past Surgical History:  Procedure Laterality Date   BREAST REDUCTION SURGERY Bilateral 01/31/2021   Procedure: MAMMARY REDUCTION  (BREAST);  Surgeon: Wallace Going, DO;  Location: [facility];  Service: Plastics;  Laterality: Bilateral;   CESAREAN SECTION     MASS EXCISION Left 12/23/2016   Procedure: EXCISION LEFT GROIN MASS;  Surgeon: Coralie Keens, MD;  Location: [facility];  Service: General;  Laterality: Left;     OB History   No obstetric history on file.     Family History  Problem Relation Age of Onset    Breast cancer Maternal Aunt     Social History   Tobacco Use   Smoking status: Never   Smokeless tobacco: Never  Vaping Use   Vaping Use: Never used  Substance Use Topics   Alcohol use: No   Drug use: Yes    Types: Marijuana    Home Medications Prior to Admission medications   Medication Sig Start Date End Date Taking? Authorizing Provider  acetaminophen (TYLENOL) 325 MG tablet Take 325 mg by mouth daily as needed for moderate pain.    [provider]  amLODipine (NORVASC) 5 MG tablet Take 5 mg by mouth daily.    [provider]  aspirin EC 81 MG tablet Take 81 mg by mouth daily.    [provider]  ferrous sulfate 325 (65 FE) MG tablet Take 325 mg by mouth daily with breakfast.    [provider]  ibuprofen (ADVIL,MOTRIN) 200 MG tablet Take 200 mg by mouth daily as needed for headache or moderate pain.    [provider]  Multiple Vitamin (MULTIVITAMIN WITH MINERALS) TABS tablet Take 1 tablet by mouth daily.    [provider]  nitroGLYCERIN (NITROGLYN) 2 % ointment Apply 0.5 inches topically every 6 (six) hours. Patient not taking: No sig reported 02/01/21   Dillingham, Loel Lofty, DO  ondansetron (ZOFRAN) 4 MG tablet Take 1 tablet (4 mg total) by mouth every 8 (eight) hours as needed for nausea  or vomiting. 01/16/21   Scheeler, Carola Rhine, PA-C    Allergies    Patient has no known allergies.  Review of Systems   Review of Systems  Constitutional:  Negative for chills and fever.  Eyes:  Negative for visual disturbance.  Respiratory:  Negative for shortness of breath.   Cardiovascular:  Positive for palpitations. Negative for chest pain.  Gastrointestinal:  Negative for nausea and vomiting.  Neurological:  Negative for syncope, speech difficulty, weakness, numbness and headaches.  All other systems reviewed and are negative.  Physical Exam Updated Vital Signs BP (!) 141/96 (BP Location: Left Arm)    Pulse 78    Temp (P)  98.2 F (36.8 C) (Oral)    Resp 15    LMP 08/15/2021 (Approximate)    SpO2 99%   Physical Exam Vitals and nursing note reviewed.  Constitutional:      Appearance: She is not ill-appearing.  HENT:     Head: Normocephalic and atraumatic.  Eyes:     Conjunctiva/sclera: Conjunctivae normal.  Cardiovascular:     Rate and Rhythm: Normal rate and regular rhythm.  Pulmonary:     Effort: Pulmonary effort is normal.     Breath sounds: Normal breath sounds.  Abdominal:     Palpations: Abdomen is soft.     Tenderness: There is no abdominal tenderness.  Musculoskeletal:     Cervical back: Neck supple.  Skin:    General: Skin is warm and dry.  Neurological:     Mental Status: She is alert.    ED Results / Procedures / Treatments   Labs (all labs ordered are listed, but only abnormal results are displayed) Labs Reviewed  BASIC METABOLIC PANEL - Abnormal; Notable for the following components:      Result Value   Sodium 134 (*)    Potassium 3.3 (*)    BUN 5 (*)    All other components within normal limits  CBC - Abnormal; Notable for the following components:   Hemoglobin 11.3 (*)    Platelets 419 (*)    All other components within normal limits    EKG EKG Interpretation  Date/Time:  Friday August 31 2021 18:16:59 EST Ventricular Rate:  75 PR Interval:  128 QRS Duration: 80 QT Interval:  374 QTC Calculation: 417 R Axis:   4 Text Interpretation: Normal sinus rhythm Low voltage QRS Cannot rule out Anterior infarct , age undetermined Abnormal ECG When compared with ECG of 05-Jul-2020 22:32, PREVIOUS ECG IS PRESENT Confirmed by Wandra Arthurs [phone]) on 08/31/2021 6:19:48 PM  Radiology No results found.  Procedures Procedures   Medications Ordered in ED Medications - No data to display  ED Course  I have reviewed the triage vital signs and the nursing notes.  Pertinent labs & imaging results that were available during my care of the patient were reviewed by me and  considered in my medical decision making (see chart for details).    MDM Rules/Calculators/A&P                          This patient presents to the ED for concern of elevated blood pressure reading/heart palpitations. this involves an extensive number of treatment options, and is a complaint that carries with it a high risk of complications and morbidity.    Co morbidities that complicate the patient evaluation  HTN and anemia  Lab Tests: I Ordered, and personally interpreted labs.  The pertinent results include:  hgb of 11.3 however appears to be similar to baseline.  Creatinine stable at 0.57 without any other acute electrolyte abnormalities.  EKG NSR  Medicines ordered and prescription drug management: No medications ordered during ED visit. BP stable in the ED without concerns for hypertensive urgency/emergency.    Vitals:   08/31/21 1018 08/31/21 1329 08/31/21 1614  BP: 130/86 132/90 (!) 141/96   I have reviewed the patients home medicines and have made adjustments as needed   Test Considered: Considered cardiac workup given complaint of heart palpitations however pt adamant she was not experiencing any chest pain. She does hae a hx of anxiety as well which could likely contribute.    Critical Interventions: N/A  Problem List / ED Course: Hypertention Blood pressure readings in the ED have been stable with the highest being 141/96.  Patient's only complaint is heart palpitations.  EKG NSR without acute ischemic changes.  No complaint of chest pain.  She had screening labs done while in the waiting room including CBC and BMP without any significant abnormalities at this time.  Have recommended that patient follow-up with her primary care provider regarding slightly elevated blood pressure reading at home however I do feel that anxiety likely is playing a part.  Her hemoglobin is stable at 11.3 today, she does have history of anemia however is compliant with iron supplements.   Advised to continue this at as well.   Reevaluation: After the interventions noted above, I reevaluated the patient and found that they have :stayed the same   Social Determinants of Health: N/A   Dispostion: After consideration of the diagnostic results and the patients response to treatment, I feel that the patent would benefit from discharge home with PCP follow up.     Final Clinical Impression(s) / ED Diagnoses Final diagnoses:  Primary hypertension    Rx / DC Orders ED Discharge Orders     None        Discharge Instructions      Please follow up with your PCP for further evaluation of your elevated blood pressure reading in the ED  Continue taking your blood pressure medication daily as indicated  Return to the ED for any new/worsening symptoms       Eustaquio Maize, PA-C 08/31/21 1825    Drenda Freeze, MD 08/31/21 2242

## 2021-08-31 NOTE — ED Triage Notes (Signed)
Pt arrives via EMS from home with hypertensive episode, hx of anxiety, denies pain, alert, oriented x4, stroke scale negative. VSS. Pt NAD at present.

## 2021-08-31 NOTE — ED Provider Notes (Signed)
Emergency Medicine Provider Triage Evaluation Note  Michelle Bell , a 45 y.o. female  was evaluated in triage.  Pt complains of high blood pressure since this morning. Takes amlodipine. Hx anxiety. Woke up this morning "not feeling right". BP was 142/110 on scene with EMS. She's also concerned her iron is low, hx iron deficiency anemia.   Review of Systems  Positive: High blood pressure Negative: CP, SOB, headache, vision changes  Physical Exam  LMP 08/15/2021 (Approximate)  Gen:   Awake, no distress   Resp:  Normal effort  MSK:   Moves extremities without difficulty  Other:    Medical Decision Making  Medically screening exam initiated at 10:21 AM.  Appropriate orders placed.  Kyliegh D Botello was informed that the remainder of the evaluation will be completed by another provider, this initial triage assessment does not replace that evaluation, and the importance of remaining in the ED until their evaluation is complete.     Estill Cotta 08/31/21 1021    Regan Lemming, MD 08/31/21 1223

## 2021-09-13 DIAGNOSIS — R202 Paresthesia of skin: Secondary | ICD-10-CM | POA: Diagnosis not present

## 2021-09-13 DIAGNOSIS — N920 Excessive and frequent menstruation with regular cycle: Secondary | ICD-10-CM | POA: Diagnosis not present

## 2021-09-13 DIAGNOSIS — I10 Essential (primary) hypertension: Secondary | ICD-10-CM | POA: Diagnosis not present

## 2021-09-13 DIAGNOSIS — D5 Iron 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): Secondary | ICD-10-CM | POA: Diagnosis not present

## 2021-09-13 DIAGNOSIS — Z87891 Personal history of nicotine dependence: Secondary | ICD-10-CM | POA: Diagnosis not present

## 2021-09-13 DIAGNOSIS — R635 Abnormal weight gain: Secondary | ICD-10-CM | POA: Diagnosis not present

## 2021-09-13 DIAGNOSIS — E782 Mixed hyperlipidemia: Secondary | ICD-10-CM | POA: Diagnosis not present

## 2021-10-01 DIAGNOSIS — F900 Attention-deficit hyperactivity disorder, predominantly inattentive type: Secondary | ICD-10-CM | POA: Diagnosis not present

## 2021-10-01 DIAGNOSIS — F330 Major depressive disorder, recurrent, mild: Secondary | ICD-10-CM | POA: Diagnosis not present

## 2021-10-16 DIAGNOSIS — E782 Mixed hyperlipidemia: Secondary | ICD-10-CM | POA: Diagnosis not present

## 2021-10-16 DIAGNOSIS — Z01818 Encounter for other preprocedural examination: Secondary | ICD-10-CM | POA: Diagnosis not present

## 2021-10-16 DIAGNOSIS — D5 Iron 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): Secondary | ICD-10-CM | POA: Diagnosis not present

## 2021-10-16 DIAGNOSIS — R202 Paresthesia of skin: Secondary | ICD-10-CM | POA: Diagnosis not present

## 2021-10-16 DIAGNOSIS — I10 Essential (primary) hypertension: Secondary | ICD-10-CM | POA: Diagnosis not present

## 2021-10-16 DIAGNOSIS — Z131 Encounter for screening for diabetes mellitus: Secondary | ICD-10-CM | POA: Diagnosis not present

## 2021-10-16 DIAGNOSIS — N920 Excessive and frequent menstruation with regular cycle: Secondary | ICD-10-CM | POA: Diagnosis not present

## 2021-10-16 DIAGNOSIS — Z87891 Personal history of nicotine dependence: Secondary | ICD-10-CM | POA: Diagnosis not present

## 2021-10-16 DIAGNOSIS — Z5181 Encounter for therapeutic drug level monitoring: Secondary | ICD-10-CM | POA: Diagnosis not present

## 2021-10-24 ENCOUNTER — Telehealth: Payer: Self-pay | Admitting: Podiatry

## 2021-10-24 ENCOUNTER — Encounter (HOSPITAL_BASED_OUTPATIENT_CLINIC_OR_DEPARTMENT_OTHER): Payer: Self-pay | Admitting: Podiatry

## 2021-10-24 DIAGNOSIS — F900 Attention-deficit hyperactivity disorder, predominantly inattentive type: Secondary | ICD-10-CM | POA: Diagnosis not present

## 2021-10-24 DIAGNOSIS — F330 Major depressive disorder, recurrent, mild: Secondary | ICD-10-CM | POA: Diagnosis not present

## 2021-10-25 ENCOUNTER — Encounter (HOSPITAL_BASED_OUTPATIENT_CLINIC_OR_DEPARTMENT_OTHER): Payer: Self-pay | Admitting: Podiatry

## 2021-10-25 ENCOUNTER — Other Ambulatory Visit: Payer: Self-pay

## 2021-10-25 NOTE — Progress Notes (Signed)
Spoke w/ via phone for pre-op interview---Michelle Bell needs dos----  UPT             Bell results------ COVID test -----patient states asymptomatic no test needed Arrive at -------0530 NPO after MN NO Solid Food.   Med rec completed Medications to take morning of surgery -----Norvasc Diabetic medication ----- Patient instructed no nail polish to be worn day of surgery Patient instructed to bring photo id and insurance card day of surgery Patient aware to have Driver (ride )  Michelle Bell caseworker/ caregiver Pt to give name and number DOS.   for 24 hours after surgery  Patient Special Instructions ----- Pre-Op special Istructions ----- Patient verbalized understanding of instructions that were given at this phone interview. Patient denies shortness of breath, chest pain, fever, cough at this phone interview.

## 2021-10-29 ENCOUNTER — Encounter (HOSPITAL_COMMUNITY): Payer: Self-pay | Admitting: *Deleted

## 2021-10-29 ENCOUNTER — Ambulatory Visit (HOSPITAL_BASED_OUTPATIENT_CLINIC_OR_DEPARTMENT_OTHER)
Admission: RE | Admit: 2021-10-29 | Discharge: 2021-10-29 | Disposition: A | Discharge status: Home / Self Care | Payer: Medicare Other | Source: Ambulatory Visit | Attending: Podiatry | Admitting: Podiatry

## 2021-10-29 ENCOUNTER — Emergency Department (HOSPITAL_COMMUNITY)
Admission: EM | Admit: 2021-10-29 | Discharge: 2021-10-30 | Disposition: A | Discharge status: Home / Self Care | Payer: Medicare Other | Source: Home / Self Care | Attending: Emergency Medicine | Admitting: Emergency Medicine

## 2021-10-29 ENCOUNTER — Ambulatory Visit (HOSPITAL_BASED_OUTPATIENT_CLINIC_OR_DEPARTMENT_OTHER): Payer: Medicare Other | Admitting: Anesthesiology

## 2021-10-29 ENCOUNTER — Other Ambulatory Visit: Payer: Self-pay

## 2021-10-29 ENCOUNTER — Encounter (HOSPITAL_BASED_OUTPATIENT_CLINIC_OR_DEPARTMENT_OTHER)
Admission: RE | Disposition: A | Discharge status: Home / Self Care | Payer: Self-pay | Source: Ambulatory Visit | Attending: Podiatry

## 2021-10-29 ENCOUNTER — Ambulatory Visit (HOSPITAL_COMMUNITY): Payer: Medicare Other

## 2021-10-29 ENCOUNTER — Encounter (HOSPITAL_BASED_OUTPATIENT_CLINIC_OR_DEPARTMENT_OTHER): Payer: Self-pay | Admitting: Podiatry

## 2021-10-29 ENCOUNTER — Other Ambulatory Visit: Payer: Self-pay | Admitting: Podiatry

## 2021-10-29 DIAGNOSIS — Z9889 Other specified postprocedural states: Secondary | ICD-10-CM | POA: Diagnosis not present

## 2021-10-29 DIAGNOSIS — D649 Anemia, unspecified: Secondary | ICD-10-CM | POA: Diagnosis not present

## 2021-10-29 DIAGNOSIS — M19072 Primary osteoarthritis, left ankle and foot: Secondary | ICD-10-CM | POA: Diagnosis not present

## 2021-10-29 DIAGNOSIS — L7622 Postprocedural hemorrhage and hematoma of skin and subcutaneous tissue following other procedure: Secondary | ICD-10-CM | POA: Insufficient documentation

## 2021-10-29 DIAGNOSIS — R000 Tachycardia, unspecified: Secondary | ICD-10-CM | POA: Diagnosis not present

## 2021-10-29 DIAGNOSIS — G8918 Other acute postprocedural pain: Secondary | ICD-10-CM | POA: Diagnosis not present

## 2021-10-29 DIAGNOSIS — M24572 Contracture, left ankle: Secondary | ICD-10-CM | POA: Diagnosis not present

## 2021-10-29 DIAGNOSIS — Z419 Encounter for procedure for purposes other than remedying health state, unspecified: Secondary | ICD-10-CM

## 2021-10-29 DIAGNOSIS — Y838 Other surgical procedures as the cause of abnormal reaction of the patient, or of later complication, without mention of misadventure at the time of the procedure: Secondary | ICD-10-CM | POA: Insufficient documentation

## 2021-10-29 DIAGNOSIS — R600 Localized edema: Secondary | ICD-10-CM | POA: Diagnosis not present

## 2021-10-29 DIAGNOSIS — I10 Essential (primary) hypertension: Secondary | ICD-10-CM | POA: Diagnosis not present

## 2021-10-29 DIAGNOSIS — T148XXA Other injury of unspecified body region, initial encounter: Secondary | ICD-10-CM

## 2021-10-29 DIAGNOSIS — Z981 Arthrodesis status: Secondary | ICD-10-CM | POA: Diagnosis not present

## 2021-10-29 DIAGNOSIS — Z7982 Long term (current) use of aspirin: Secondary | ICD-10-CM | POA: Insufficient documentation

## 2021-10-29 HISTORY — DX: Depression, unspecified: F32.A

## 2021-10-29 HISTORY — DX: Anxiety disorder, unspecified: F41.9

## 2021-10-29 HISTORY — PX: FUSION OF TALONAVICULAR JOINT: SHX6332

## 2021-10-29 HISTORY — DX: Mixed hyperlipidemia: E78.2

## 2021-10-29 HISTORY — PX: SUBTALAR JOINT ARTHROEREISIS: SHX6201

## 2021-10-29 HISTORY — DX: Iron 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): D50.0

## 2021-10-29 HISTORY — DX: Excessive and frequent menstruation with regular cycle: N92.0

## 2021-10-29 HISTORY — DX: Unspecified osteoarthritis, unspecified site: M19.90

## 2021-10-29 LAB — POCT PREGNANCY, URINE: Preg Test, Ur: NEGATIVE

## 2021-10-29 IMAGING — CT CT FOOT*L* W/O CM
3 series · 11 of 33 positions shown, 13 images · non-contrast
Comparison: X-ray 01/17/2020

CLINICAL DATA: Subtalar joint arthritis/talonavicular joint
arthritis

EXAM:
CT OF THE LEFT FOOT WITHOUT CONTRAST
TECHNIQUE: Multidetector CT imaging of the left foot was performed according to
the standard protocol. Multiplanar CT image reconstructions were
also generated.

[Series 5: sfov lower extremity left 2.00 br40 s3 soft · axial · 0.54mm/px · z∈[+602,+692]mm · 3 of 74 slices shown, 4 images (1 of 3)]
[im 17/74  soft-tissue]
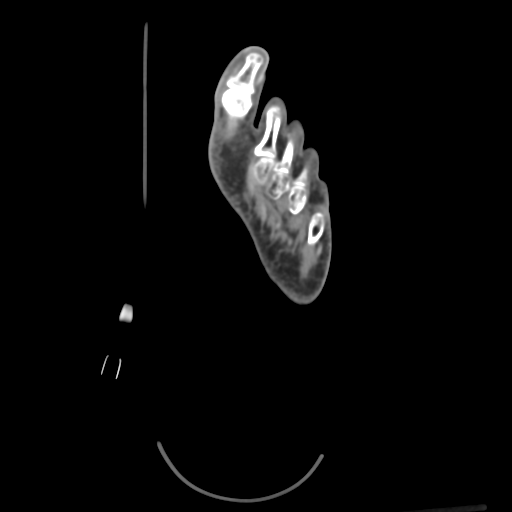
[im 17/74  bone]
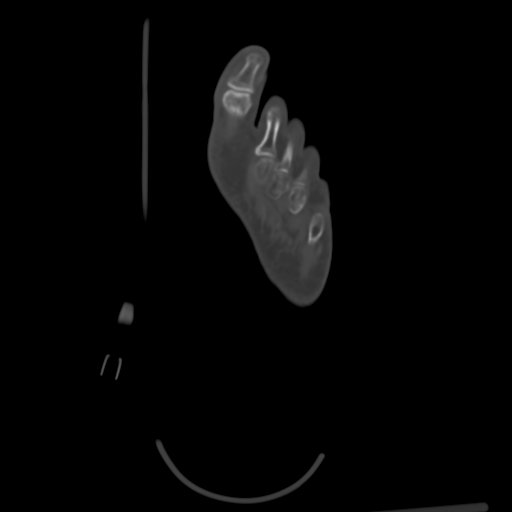
[im 40/74  bone]
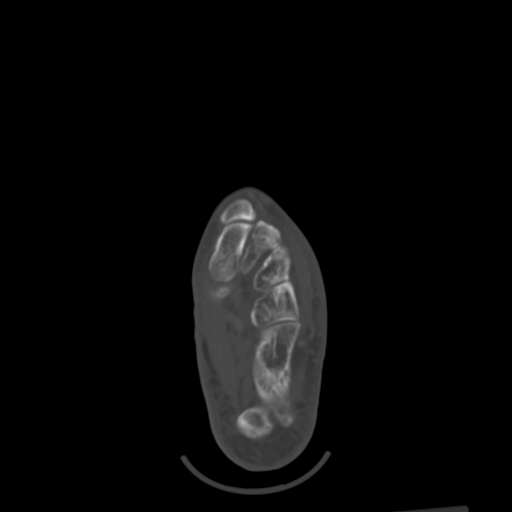
[im 62/74  bone]
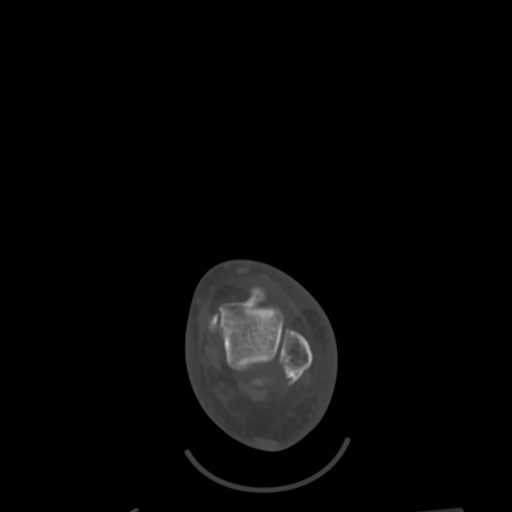

[Series 9: sfov lower extremity left 2.00 br40 s3 soft · coronal · 0.21mm/px · 3 of 131 slices shown (2 of 3)]
[im 33/131  bone]
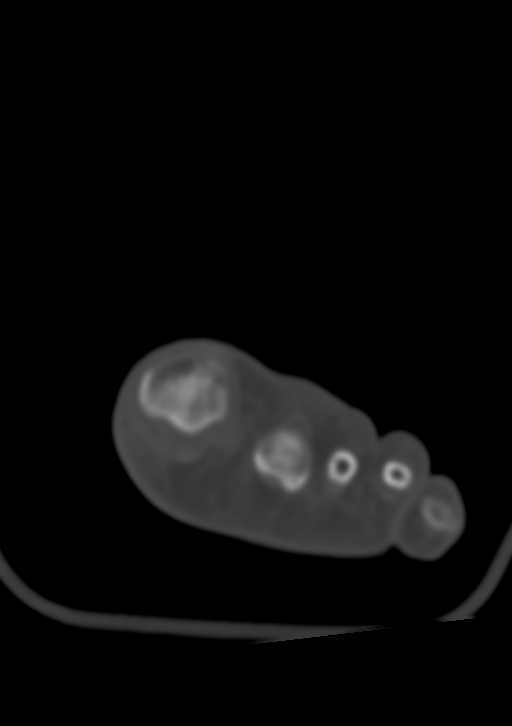
[im 55/131  bone]
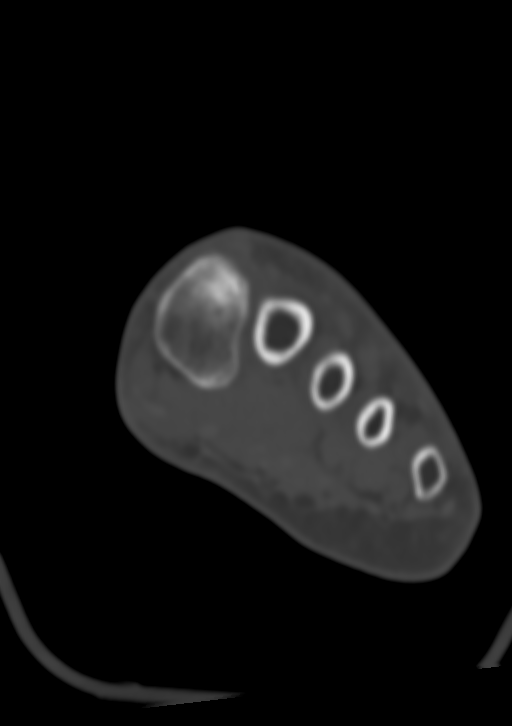
[im 76/131  bone]
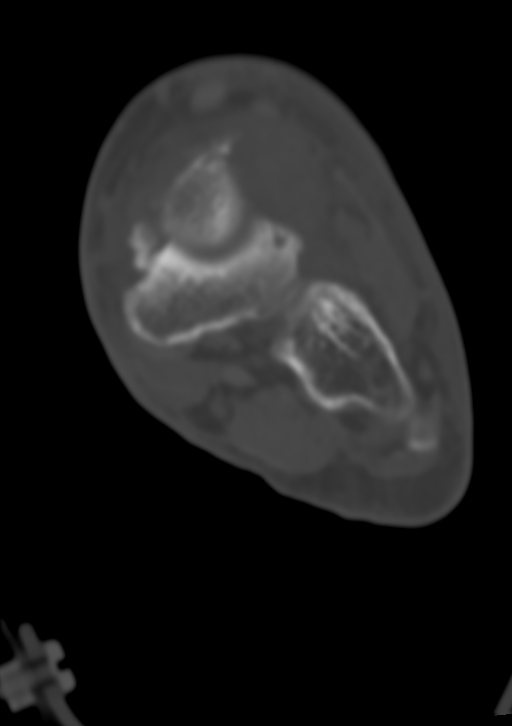

[Series 13: sfov lower extremity left 2.00 br40 s3 soft · sagittal · 0.29mm/px · 5 of 52 slices shown, 6 images (3 of 3)]
[im 18/52  bone]
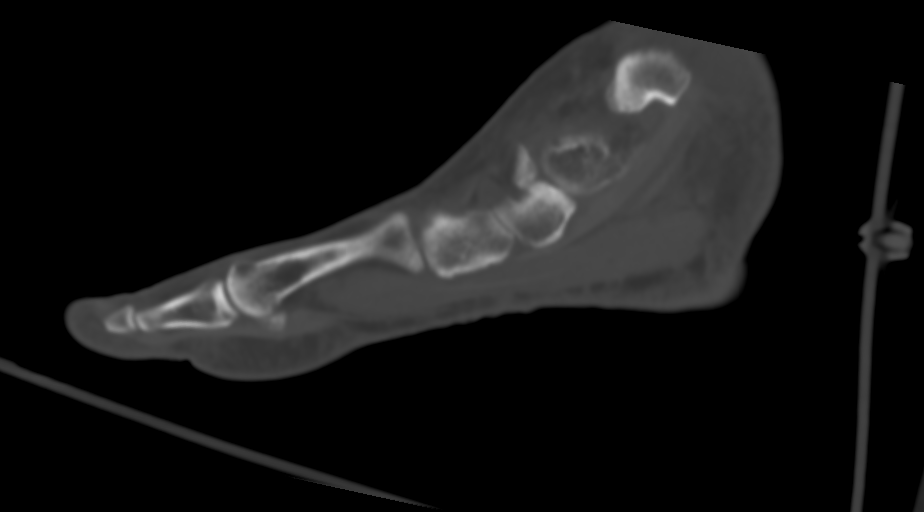
[im 22/52  bone]
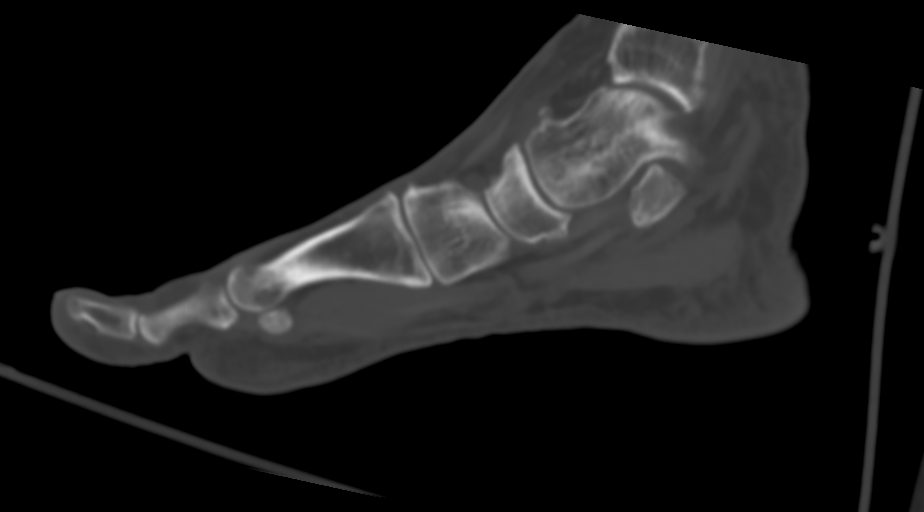
[im 26/52  soft-tissue]
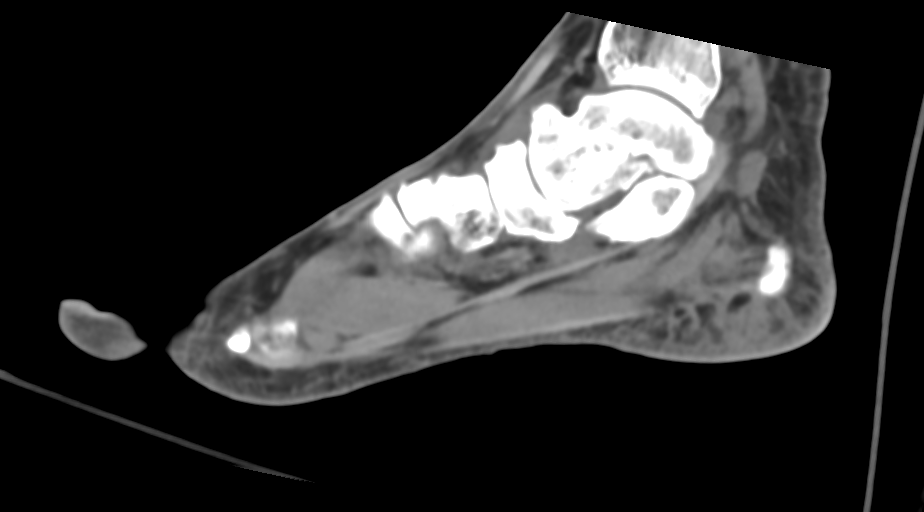
[im 26/52  bone]
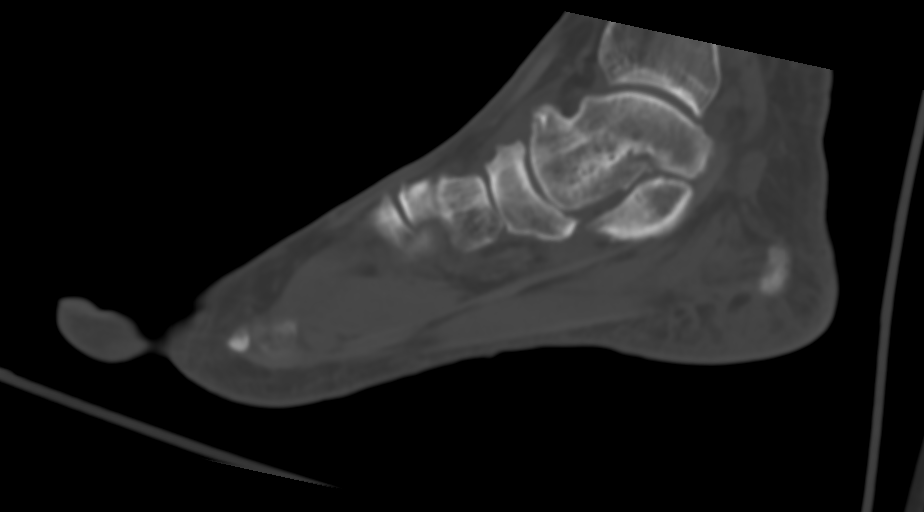
[im 30/52  bone]
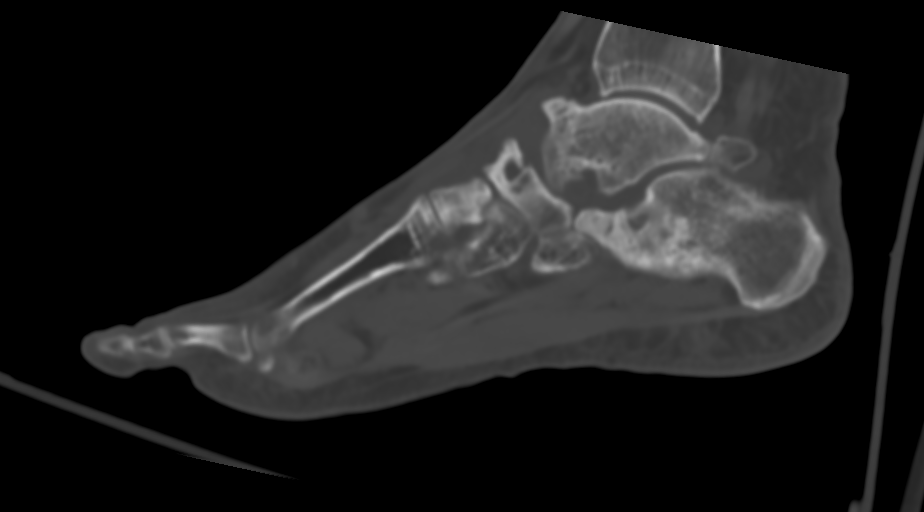
[im 35/52  bone]
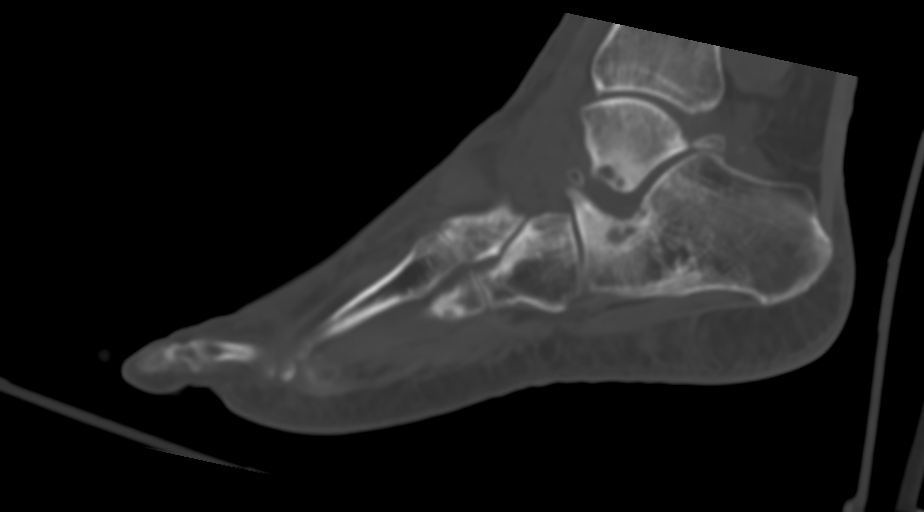

[11 of 33 positions shown; findings below may reference images not displayed]

FINDINGS: Bones/Joint/Cartilage

No evidence of acute fracture or dislocation. Pes planovalgus
alignment. No evidence of osseous tarsal coalition. Moderate
arthropathy within the hindfoot and midfoot most prominently
involving the subtalar and talonavicular joints. Degenerative
subchondral cysts or intraosseous ganglia within the lateral
navicular measuring up to 11 mm. Prominent talonavicular dorsal
spurring/hypertrophy. Subchondral sclerosis and cystic changes about
the subtalar joint. Small well corticated ossification adjacent to
the anterior process of the calcaneus, which may be degenerative or
posttraumatic. Large os trigonum with surrounding fluid.

Mild tibiotalar osteoarthritis with small marginal osteophytes. No
evidence of talar dome osteochondral lesion. Joint spaces of the
forefoot are preserved. No erosion or periosteal elevation.

Ligaments

Suboptimally assessed by CT. Superomedial aspect of the spring
ligament appears thickened and becomes ill-defined at the level of
the talar head, and may be torn (series 5, images 15-26).

Muscles and Tendons

No acute musculotendinous abnormality is evident by CT.

Soft tissues

Nonspecific subcutaneous edema.  No organized fluid collections.
IMPRESSION: 1. Pes planovalgus alignment with moderate arthropathy involving the
subtalar and talonavicular joints.
2. Large os trigonum with surrounding fluid. Correlate for posterior
ankle impingement.
3. Superomedial aspect of the spring ligament appears thickened and
becomes ill-defined at the level of the talar head, and may be torn.

## 2021-10-29 SURGERY — FUSION, TALONAVICULAR JOINT
Anesthesia: Regional | Laterality: Left

## 2021-10-29 MED ORDER — PROPOFOL 10 MG/ML IV BOLUS
INTRAVENOUS | Status: DC | PRN
  Administered 2021-10-29: 200 mg via INTRAVENOUS

## 2021-10-29 MED ORDER — ROPIVACAINE HCL 5 MG/ML IJ SOLN
INTRAMUSCULAR | Status: DC | PRN
  Administered 2021-10-29: 20 mL via PERINEURAL
  Administered 2021-10-29: 30 mL via PERINEURAL

## 2021-10-29 MED ORDER — DEXAMETHASONE SODIUM PHOSPHATE 10 MG/ML IJ SOLN
INTRAMUSCULAR | Status: AC
  Filled 2021-10-29: qty 1

## 2021-10-29 MED ORDER — MIDAZOLAM HCL 2 MG/2ML IJ SOLN
INTRAMUSCULAR | Status: AC
  Filled 2021-10-29: qty 2

## 2021-10-29 MED ORDER — FENTANYL CITRATE (PF) 100 MCG/2ML IJ SOLN: 25.0000 ug | INTRAMUSCULAR | Status: DC | PRN

## 2021-10-29 MED ORDER — MIDAZOLAM HCL 2 MG/2ML IJ SOLN
1.0000 mg | INTRAMUSCULAR | Status: DC | PRN
  Administered 2021-10-29: 2 mg via INTRAVENOUS

## 2021-10-29 MED ORDER — OXYCODONE-ACETAMINOPHEN 5-325 MG PO TABS: 1.0000 | ORAL_TABLET | ORAL | 0 refills | Status: DC | PRN

## 2021-10-29 MED ORDER — CEFAZOLIN SODIUM-DEXTROSE 2-4 GM/100ML-% IV SOLN
INTRAVENOUS | Status: AC
  Filled 2021-10-29: qty 100

## 2021-10-29 MED ORDER — ACETAMINOPHEN 500 MG PO TABS
ORAL_TABLET | ORAL | Status: AC
  Filled 2021-10-29: qty 2

## 2021-10-29 MED ORDER — CEFAZOLIN SODIUM-DEXTROSE 2-4 GM/100ML-% IV SOLN
2.0000 g | Freq: Once | INTRAVENOUS | Status: AC
  Administered 2021-10-29: 2 g via INTRAVENOUS

## 2021-10-29 MED ORDER — 0.9 % SODIUM CHLORIDE (POUR BTL) OPTIME
TOPICAL | Status: DC | PRN
  Administered 2021-10-29: 500 mL

## 2021-10-29 MED ORDER — DEXAMETHASONE SODIUM PHOSPHATE 10 MG/ML IJ SOLN
INTRAMUSCULAR | Status: DC | PRN
  Administered 2021-10-29 (×2): 5 mg

## 2021-10-29 MED ORDER — FENTANYL CITRATE (PF) 100 MCG/2ML IJ SOLN
INTRAMUSCULAR | Status: AC
  Filled 2021-10-29: qty 2

## 2021-10-29 MED ORDER — FENTANYL CITRATE (PF) 100 MCG/2ML IJ SOLN
25.0000 ug | INTRAMUSCULAR | Status: DC | PRN
  Administered 2021-10-29 (×2): 50 ug via INTRAVENOUS

## 2021-10-29 MED ORDER — BUPIVACAINE HCL (PF) 0.5 % IJ SOLN
INTRAMUSCULAR | Status: AC
  Filled 2021-10-29: qty 30

## 2021-10-29 MED ORDER — ONDANSETRON HCL 4 MG/2ML IJ SOLN
INTRAMUSCULAR | Status: AC
  Filled 2021-10-29: qty 2

## 2021-10-29 MED ORDER — FENTANYL CITRATE (PF) 100 MCG/2ML IJ SOLN
INTRAMUSCULAR | Status: DC | PRN
  Administered 2021-10-29 (×4): 25 ug via INTRAVENOUS

## 2021-10-29 MED ORDER — MIDAZOLAM HCL 5 MG/5ML IJ SOLN
INTRAMUSCULAR | Status: DC | PRN
  Administered 2021-10-29: 2 mg via INTRAVENOUS

## 2021-10-29 MED ORDER — DEXAMETHASONE SODIUM PHOSPHATE 10 MG/ML IJ SOLN
INTRAMUSCULAR | Status: DC | PRN
  Administered 2021-10-29: 10 mg via INTRAVENOUS

## 2021-10-29 MED ORDER — LIDOCAINE 2% (20 MG/ML) 5 ML SYRINGE
INTRAMUSCULAR | Status: DC | PRN
  Administered 2021-10-29: 40 mg via INTRAVENOUS

## 2021-10-29 MED ORDER — LIDOCAINE HCL 2 % IJ SOLN
INTRAMUSCULAR | Status: AC
  Filled 2021-10-29: qty 20

## 2021-10-29 MED ORDER — PROPOFOL 10 MG/ML IV BOLUS
INTRAVENOUS | Status: AC
  Filled 2021-10-29: qty 20

## 2021-10-29 MED ORDER — IBUPROFEN 800 MG PO TABS: 800.0000 mg | ORAL_TABLET | Freq: Four times a day (QID) | ORAL | 1 refills | Status: AC | PRN

## 2021-10-29 MED ORDER — ONDANSETRON HCL 4 MG/2ML IJ SOLN
INTRAMUSCULAR | Status: DC | PRN
  Administered 2021-10-29: 4 mg via INTRAVENOUS

## 2021-10-29 MED ORDER — LACTATED RINGERS IV SOLN: INTRAVENOUS | Status: DC

## 2021-10-29 MED ORDER — ACETAMINOPHEN 500 MG PO TABS
1000.0000 mg | ORAL_TABLET | Freq: Once | ORAL | Status: AC
  Administered 2021-10-29: 1000 mg via ORAL

## 2021-10-29 SURGICAL SUPPLY — 58 items
4.7  long drill ×1 IMPLANT
BIT DRILL LONG ACUTRAK2 4.7 (BIT) IMPLANT
BIT DRILL LONG ACUTRAK2 7.5 (BIT) IMPLANT
BLADE SURG 15 STRL LF DISP TIS (BLADE) ×2 IMPLANT
BLADE SURG 15 STRL SS (BLADE) ×16
BNDG CMPR 9X4 STRL LF SNTH (GAUZE/BANDAGES/DRESSINGS) ×1
BNDG COHESIVE 4X5 TAN ST LF (GAUZE/BANDAGES/DRESSINGS) ×1 IMPLANT
BNDG ELASTIC 4X5.8 VLCR STR LF (GAUZE/BANDAGES/DRESSINGS) ×2 IMPLANT
BNDG ESMARK 4X9 LF (GAUZE/BANDAGES/DRESSINGS) ×2 IMPLANT
BNDG GAUZE ELAST 4 BULKY (GAUZE/BANDAGES/DRESSINGS) ×2 IMPLANT
BONE CHIP PRESERV 30CC PCAN30 (Bone Implant) ×2 IMPLANT
BUR OVAL CARBIDE 4.0 (BURR) ×1 IMPLANT
COVER BACK TABLE 60X90IN (DRAPES) ×2 IMPLANT
DRAPE C-ARM 42X120 X-RAY (DRAPES) ×1 IMPLANT
DRAPE C-ARMOR (DRAPES) ×1 IMPLANT
DRAPE EXTREMITY T 121X128X90 (DISPOSABLE) ×2 IMPLANT
DRAPE IMP U-DRAPE 54X76 (DRAPES) ×2 IMPLANT
DRILL LONG ACUTRAK2 4.7 (BIT) ×2
DRILL LONG ACUTRAK2 7.5 (BIT) ×2
DRSG PAD ABDOMINAL 8X10 ST (GAUZE/BANDAGES/DRESSINGS) ×1 IMPLANT
DURAPREP 26ML APPLICATOR (WOUND CARE) ×2 IMPLANT
Dyna Force staple kit ×1 IMPLANT
ELECT REM PT RETURN 9FT ADLT (ELECTROSURGICAL) ×2
ELECTRODE REM PT RTRN 9FT ADLT (ELECTROSURGICAL) ×1 IMPLANT
GAUZE 4X4 16PLY ~~LOC~~+RFID DBL (SPONGE) ×2 IMPLANT
GAUZE SPONGE 4X4 12PLY STRL (GAUZE/BANDAGES/DRESSINGS) ×2 IMPLANT
GAUZE XEROFORM 1X8 LF (GAUZE/BANDAGES/DRESSINGS) ×2 IMPLANT
GLOVE SURG ENC MOIS LTX SZ7 (GLOVE) ×3 IMPLANT
GLOVE SURG POLYISO LF SZ7.5 (GLOVE) ×1 IMPLANT
GLOVE SURG UNDER POLY LF SZ7.5 (GLOVE) ×4 IMPLANT
GOWN STRL REUS W/TWL LRG LVL3 (GOWN DISPOSABLE) ×3 IMPLANT
GRAFT BNE CANC CHIPS 1-8 30CC (Bone Implant) IMPLANT
GUIDEWIRE ORTHO .094INX9.25IN (WIRE) ×2 IMPLANT
GUIDEWIRE ORTHO.062IN X 9.25IN (WIRE) ×4 IMPLANT
HYDROGEN PEROXIDE 16OZ (MISCELLANEOUS) ×1 IMPLANT
KIT PREP DYNAFORCE STAPLE 20 (KITS) ×1 IMPLANT
KIT TURNOVER CYSTO (KITS) ×2 IMPLANT
NS IRRIG 500ML POUR BTL (IV SOLUTION) ×1 IMPLANT
PACK BASIN DAY SURGERY FS (CUSTOM PROCEDURE TRAY) ×2 IMPLANT
PENCIL SMOKE EVACUATOR (MISCELLANEOUS) ×2 IMPLANT
PUTTY DBM STAGRAFT PLUS 10CC (Putty) ×1 IMPLANT
SCREW ACUTRAK2  4.7 40.0MM (Screw) ×2 IMPLANT
SCREW ACUTRAK2  7.5 65.0MM (Screw) ×2 IMPLANT
SCREW ACUTRAK2  7.5 80.0MM (Screw) ×2 IMPLANT
SCREW ACUTRAK2 4.7 40.0MM (Screw) IMPLANT
SCREW ACUTRAK2 7.5 65.0MM (Screw) IMPLANT
SCREW ACUTRAK2 7.5 80.0MM (Screw) IMPLANT
STAPLE HIMAX PLUS 20X20X20 (Staple) ×1 IMPLANT
STAPLER VISISTAT 35W (STAPLE) ×1 IMPLANT
STOCKINETTE 6  STRL (DRAPES) ×2
STOCKINETTE 6 STRL (DRAPES) ×1 IMPLANT
SUCTION FRAZIER HANDLE 10FR (MISCELLANEOUS) ×2
SUCTION TUBE FRAZIER 10FR DISP (MISCELLANEOUS) ×1 IMPLANT
SUT MNCRL AB 3-0 PS2 18 (SUTURE) ×2 IMPLANT
SUT MNCRL AB 4-0 PS2 18 (SUTURE) ×2 IMPLANT
SUT PROLENE 3 0 PS 2 (SUTURE) ×1 IMPLANT
SYR BULB EAR ULCER 3OZ GRN STR (SYRINGE) ×2 IMPLANT
UNDERPAD 30X36 HEAVY ABSORB (UNDERPADS AND DIAPERS) ×2 IMPLANT

## 2021-10-29 NOTE — Anesthesia Postprocedure Evaluation (Signed)
Anesthesia Post Note  Patient: Michelle Bell  Procedure(s) Performed: FUSION OF TALONAVICULAR JOINT (Left) SUBTALAR JOINT ARTHRODESIS (Left)     Patient location during evaluation: PACU Anesthesia Type: Regional and General Level of consciousness: awake and alert Pain management: pain level controlled Vital Signs Assessment: post-procedure vital signs reviewed and stable Respiratory status: spontaneous breathing, nonlabored ventilation, respiratory function stable and patient connected to nasal cannula oxygen Cardiovascular status: blood pressure returned to baseline and stable Postop Assessment: no apparent nausea or vomiting Anesthetic complications: no   No notable events documented.  Last Vitals:  Vitals:   10/29/21 1200 10/29/21 1215  BP: 104/77 98/66  Pulse: 81 79  Resp: 17 19  Temp:  36.4 C  SpO2: 95% 94%    Last Pain:  Vitals:   10/29/21 1215  TempSrc:   PainSc: 0-No pain                 Reona Zendejas L Blessin Kanno

## 2021-10-29 NOTE — Anesthesia Procedure Notes (Signed)
Anesthesia Regional Block: Popliteal block   Pre-Anesthetic Checklist: , timeout performed,  Correct Patient, Correct Site, Correct Laterality,  Correct Procedure, Correct Position, site marked,  Risks and benefits discussed,  Surgical consent,  Pre-op evaluation,  At surgeon's request and post-op pain management  Laterality: Left  Prep: Maximum Sterile Barrier Precautions used, chloraprep       Needles:  Injection technique: Single-shot  Needle Type: Echogenic Stimulator Needle     Needle Length: 9cm  Needle Gauge: 22     Additional Needles:   Procedures:,,,, ultrasound used (permanent image in chart),,    Narrative:  Start time: 10/29/2021 7:08 AM End time: 10/29/2021 7:11 AM Injection made incrementally with aspirations every 5 mL.  Performed by: Personally  Anesthesiologist: Freddrick March, MD  Additional Notes: Monitors applied. No increased pain on injection. No increased resistance to injection. Injection made in 5cc increments. Good needle visualization. Patient tolerated procedure well.

## 2021-10-29 NOTE — H&P (View-Only) (Signed)
Assisted Dr. Lanetta Inch with left, ultrasound guided, popliteal, adductor canal block. Side rails up, monitors on throughout procedure. See vital signs in flow sheet. Tolerated Procedure well.

## 2021-10-29 NOTE — Anesthesia Procedure Notes (Signed)
Procedure Name: LMA Insertion Date/Time: 10/29/2021 7:57 AM Performed by: Nawal Burling D, CRNA Pre-anesthesia Checklist: Patient identified, Emergency Drugs available, Suction available and Patient being monitored Patient Re-evaluated:Patient Re-evaluated prior to induction Oxygen Delivery Method: Circle system utilized Preoxygenation: Pre-oxygenation with 100% oxygen Induction Type: IV induction Ventilation: Mask ventilation without difficulty LMA: LMA inserted LMA Size: 4.0 Tube type: Oral Number of attempts: 1 Placement Confirmation: positive ETCO2 and breath sounds checked- equal and bilateral Tube secured with: Tape Dental Injury: Teeth and Oropharynx as per pre-operative assessment

## 2021-10-29 NOTE — Anesthesia Procedure Notes (Signed)
Anesthesia Regional Block: Adductor canal block   Pre-Anesthetic Checklist: , timeout performed,  Correct Patient, Correct Site, Correct Laterality,  Correct Procedure, Correct Position, site marked,  Risks and benefits discussed,  Surgical consent,  Pre-op evaluation,  At surgeon's request and post-op pain management  Laterality: Left  Prep: Maximum Sterile Barrier Precautions used, chloraprep       Needles:  Injection technique: Single-shot  Needle Type: Echogenic Stimulator Needle     Needle Length: 9cm  Needle Gauge: 22     Additional Needles:   Procedures:,,,, ultrasound used (permanent image in chart),,    Narrative:  Start time: 10/29/2021 7:11 AM End time: 10/29/2021 7:14 AM Injection made incrementally with aspirations every 5 mL.  Performed by: Personally  Anesthesiologist: Freddrick March, MD  Additional Notes: Monitors applied. No increased pain on injection. No increased resistance to injection. Injection made in 5cc increments. Good needle visualization. Patient tolerated procedure well.

## 2021-10-29 NOTE — ED Provider Triage Note (Signed)
Emergency Medicine Provider Triage Evaluation Note  Michelle Bell , a 46 y.o. female  was evaluated in triage.  Pt complains of postop bleeding. She had surgery on her left foot today.  She, according to surgical notes, had the dressing reinforced.  Prior to discharge.  She states she does not take any blood thinning medications.  She did not try and call her surgeon prior to coming in today.  She states that she has blood through her dressing.  She block and states she can not feel anything still as expected.   Physical Exam  BP (!) 143/92 (BP Location: Left Arm)    Pulse (!) 104    Temp 98.1 F (36.7 C) (Oral)    Resp 18    SpO2 100%  Gen:   Awake, no distress   Resp:  Normal effort  MSK:   Moves extremities without difficulty  Other:  Ace wrap and dressing is saturated with blood including about half of an ABD pad fully saturated and about 10-12 4 x 4 gauze squares fully saturated. Part of the rolled gauze was removed however the portions covering the surgical wounds were left in place and not disrupted.  Additional rolled gauze and 4 x 4's were placed.  Medical Decision Making  Medically screening exam initiated at 10:57 PM.  Appropriate orders placed.  Michelle Bell was informed that the remainder of the evaluation will be completed by another provider, this initial triage assessment does not replace that evaluation, and the importance of remaining in the ED until their evaluation is complete.    Lorin Glass, [place] 10/29/21 2301

## 2021-10-29 NOTE — ED Triage Notes (Signed)
Pt had foot surgery today, reports the blood has come through the dressing. Nerve block to foot, no pain. Partial dressing removed by PA to continue to observe for further bleeding

## 2021-10-29 NOTE — Progress Notes (Signed)
Moderated amount of sanquinous drainage noted to surgical dressing; reinforced with 4x4s, ABD and Kling wrap.  Ice applied.  Will continue to monitor.

## 2021-10-29 NOTE — Anesthesia Preprocedure Evaluation (Addendum)
Anesthesia Evaluation  Patient identified by MRN, date of birth, ID band Patient awake    Reviewed: Allergy & Precautions, NPO status , Patient's Chart, lab work & pertinent test results  Airway Mallampati: III  TM Distance: >3 FB Neck ROM: Full    Dental  (+) Dental Advisory Given, Chipped,    Pulmonary neg pulmonary ROS,    Pulmonary exam normal breath sounds clear to auscultation       Cardiovascular hypertension, negative cardio ROS Normal cardiovascular exam Rhythm:Regular Rate:Normal     Neuro/Psych PSYCHIATRIC DISORDERS Anxiety Depression negative neurological ROS     GI/Hepatic negative GI ROS, Neg liver ROS,   Endo/Other  negative endocrine ROS  Renal/GU negative Renal ROS  negative genitourinary   Musculoskeletal  (+) Arthritis ,   Abdominal   Peds  Hematology  (+) Blood dyscrasia, anemia ,   Anesthesia Other Findings   Reproductive/Obstetrics                           Anesthesia Physical Anesthesia Plan  ASA: 2  Anesthesia Plan: General and Regional   Post-op Pain Management: Regional block* and Tylenol PO (pre-op)*   Induction: Intravenous  PONV Risk Score and Plan: 3 and Ondansetron, Dexamethasone and Midazolam  Airway Management Planned: LMA  Additional Equipment:   Intra-op Plan:   Post-operative Plan: Extubation in OR  Informed Consent: I have reviewed the patients History and Physical, chart, labs and discussed the procedure including the risks, benefits and alternatives for the proposed anesthesia with the patient or authorized representative who has indicated his/her understanding and acceptance.     Dental advisory given  Plan Discussed with: CRNA  Anesthesia Plan Comments:         Anesthesia Quick Evaluation

## 2021-10-29 NOTE — Transfer of Care (Signed)
Immediate Anesthesia Transfer of Care Note  Patient: Michelle Bell  Procedure(s) Performed: FUSION OF TALONAVICULAR JOINT (Left) SUBTALAR JOINT ARTHRODESIS (Left)  Patient Location: PACU  Anesthesia Type:General  Level of Consciousness: awake, alert  and oriented  Airway & Oxygen Therapy: Patient Spontanous Breathing and Patient connected to nasal cannula oxygen  Post-op Assessment: Report given to RN and Post -op Vital signs reviewed and stable  Post vital signs: Reviewed and stable  Last Vitals:  Vitals Value Taken Time  BP 116/77 10/29/21 1115  Temp    Pulse 82 10/29/21 1116  Resp 23 10/29/21 1117  SpO2 99 % 10/29/21 1116  Vitals shown include unvalidated device data.  Last Pain:  Vitals:   10/29/21 0615  TempSrc: Oral  PainSc: 0-No pain      Patients Stated Pain Goal: 8 (42/[id number])  Complications: No notable events documented.

## 2021-10-29 NOTE — Interval H&P Note (Signed)
History and Physical Interval Note:  10/29/2021 7:32 AM  Michelle Bell  has presented today for surgery, with the diagnosis of ARTHRITIS LEFT FOOT.  The various methods of treatment have been discussed with the patient and family. After consideration of risks, benefits and other options for treatment, the patient has consented to  Procedure(s) with comments: FUSION OF TALONAVICULAR JOINT (Left) - GENERAL WITH BLOCK SUBTALAR JOINT ARTHRODESIS (Left)  with gastrocnemius recession as a surgical intervention.  The patient's history has been reviewed, patient examined, no change in status, stable for surgery.  I have reviewed the patient's chart and labs.  Questions were answered to the patient's satisfaction.     Felipa Furnace

## 2021-10-29 NOTE — H&P (Signed)
Anesthesia H&P Update: History and Physical Exam reviewed; patient is OK for planned anesthetic and procedure. ? ?

## 2021-10-29 NOTE — Progress Notes (Signed)
Assisted Dr. Lanetta Inch with left, ultrasound guided, popliteal, adductor canal block. Side rails up, monitors on throughout procedure. See vital signs in flow sheet. Tolerated Procedure well.

## 2021-10-29 NOTE — Discharge Instructions (Addendum)
After Surgery Instructions   1) If you are recuperating from surgery anywhere other than home, please be sure to leave [place] the number where you can be reached.  2) Go directly home and rest.  3) Keep the operated foot(feet) elevated six inches above the hip when sitting or lying down. This will help control swelling and pain.  4) Support the elevated foot and leg with pillows. DO NOT PLACE PILLOWS UNDER THE KNEE.  5) DO NOT REMOVE or get your bandages WET, unless you were given different instructions by your doctor to do so. This increases the risk of infection.  6) Wear your surgical shoe or surgical boot at all times when you are up on your feet.  7) A limited amount of pain and swelling may occur. The skin may take on a bruised appearance. DO NOT BE ALARMED, THIS IS NORMAL.  8) For slight pain and swelling, apply an ice pack directly over the bandages for 15 minutes only out of each hour of the day. Continue until seen in the office for your first post op visit. DO NOT APPLY ANY FORM OF HEAT TO THE AREA.  Do NOT take any acetaminophen or Tylenol today until 1:00pm if needed.  9) Have prescriptions filled immediately and take as directed.  10) Drink lots of liquids, water and juice to stay hydrated.  11) CALL IMMEDIATELY IF:  *Bleeding continues until the following day of surgery  *Pain increases and/or does not respond to medication  *Bandages or cast appears to tight  *If your bandage gets wet  *Trip, fall or stump your surgical foot  *If your temperature goes above 101  *If you have ANY questions at all  [place]. ADHERING TO THESE INSTRUCTIONS WILL OFFER YOU THE MOST COMPLETE RESULTS    Post Anesthesia Home Care Instructions  Activity: Get plenty of rest for the remainder of the day. A responsible individual must stay with you for 24 hours following the procedure.  For the next 24 hours, DO NOT: -Drive a car -Paediatric nurse -Drink  alcoholic beverages -Take any medication unless instructed by your physician -Make any legal decisions or sign important papers.  Meals: Start with liquid foods such as gelatin or soup. Progress to regular foods as tolerated. Avoid greasy, spicy, heavy foods. If nausea and/or vomiting occur, drink only clear liquids until the nausea and/or vomiting subsides. Call your physician if vomiting continues.  Special Instructions/Symptoms: Your throat may feel dry or sore from the anesthesia or the breathing tube placed in your throat during surgery. If this causes discomfort, gargle with warm salt water. The discomfort should disappear within 24 hours.

## 2021-10-30 ENCOUNTER — Telehealth: Payer: Self-pay | Admitting: Podiatry

## 2021-10-30 NOTE — Telephone Encounter (Signed)
FYI: Patient had surgery on 2/27 and then last night she went to the ED at [facility] in reference to bleeding from wound. I am not sure if you want to schedule the patient to be seen or just follow up with her to see if the bleeding stopped. The ED sent a fax over showing the ED Visit summary which should be in [place]. I

## 2021-10-30 NOTE — Discharge Instructions (Signed)
The bleeding from your incision has seem to resolve.  Your wounds were redressed.  Please continue to follow all all postop instructions from Dr. Posey Pronto.  Call his office for seen this morning to make them aware of your bleeding and your ED visit to be able to schedule very close follow-up.  Return to the emergency department for new or worsening symptoms including but not limited to recurrence of bleeding that continues, new or worsening pain, fever, discoloration of your toes, or any other concerns.

## 2021-10-30 NOTE — ED Provider Notes (Signed)
[facility] DEPT Provider Note   CSN: [id number] Arrival date & time: 10/29/21  2224     History  Chief Complaint  Patient presents with   Wound Check    Michelle Bell is a 46 y.o. female who presents to the emergency department with complaints of postoperative bleeding that began shortly prior to arrival this evening.  Patient reports she had surgery to her left lower extremity this morning, per chart review it appears this was a fusion of the talonavicular joint and a subtalar joint arthrodesis.  Patient states that the nerve block is still in place therefore she does not have much feeling, she has been doing okay since surgery this morning, however tonight she noted spontaneous blood appearing through her bandage.  She does not think that she hit it against anything.  She denies pain.  No alleviating or aggravating factors she does not take blood thinners.  HPI     Home Medications Prior to Admission medications   Medication Sig Start Date End Date Taking? Authorizing Provider  acetaminophen (TYLENOL) 325 MG tablet Take 325 mg by mouth daily as needed for moderate pain.    [provider]  amLODipine (NORVASC) 5 MG tablet Take 5 mg by mouth daily.    [provider]  aspirin EC 81 MG tablet Take 81 mg by mouth daily.    [provider]  diclofenac Sodium (VOLTAREN) 1 % GEL Apply topically 4 (four) times daily.    [provider]  ferrous sulfate 325 (65 FE) MG tablet Take 325 mg by mouth daily with breakfast.    [provider]  gabapentin (NEURONTIN) 300 MG capsule Take 300 mg by mouth at bedtime.    [provider]  hydrochlorothiazide (HYDRODIURIL) 25 MG tablet Take 25 mg by mouth daily. Patient not taking: Reported on 10/29/2021    [provider]  ibuprofen (ADVIL) 800 MG tablet Take 1 tablet (800 mg total) by mouth every 6 (six) hours as needed. 10/29/21   Felipa Furnace, DPM   ibuprofen (ADVIL,MOTRIN) 200 MG tablet Take 200 mg by mouth daily as needed for headache or moderate pain.    [provider]  meloxicam (MOBIC) 7.5 MG tablet Take 7.5 mg by mouth daily. Patient not taking: Reported on 10/29/2021    [provider]  Multiple Vitamin (MULTIVITAMIN WITH MINERALS) TABS tablet Take 1 tablet by mouth daily.    [provider]  nitroGLYCERIN (NITROGLYN) 2 % ointment Apply 0.5 inches topically every 6 (six) hours. Patient not taking: No sig reported 02/01/21   Dillingham, Loel Lofty, DO  ondansetron (ZOFRAN) 4 MG tablet Take 1 tablet (4 mg total) by mouth every 8 (eight) hours as needed for nausea or vomiting. 01/16/21   Scheeler, Carola Rhine, PA-C  oxyCODONE-acetaminophen (PERCOCET) 5-325 MG tablet Take 1 tablet by mouth every 4 (four) hours as needed for severe pain. 10/29/21   Felipa Furnace, DPM      Allergies    Patient has no known allergies.    Review of Systems   Review of Systems  Constitutional:  Negative for chills and fever.  Respiratory:  Negative for shortness of breath.   Cardiovascular:  Negative for chest pain.  Gastrointestinal:  Negative for abdominal pain.  Musculoskeletal:  Negative for arthralgias.  Skin:  Positive for wound.  Neurological:  Positive for numbness (s/p nerve block).  All other systems reviewed and are negative.  Physical Exam Updated Vital Signs BP Marland Kitchen)  143/92 (BP Location: Left Arm)    Pulse (!) 104    Temp 98.1 F (36.7 C) (Oral)    Resp 18    SpO2 100%  Physical Exam Vitals and nursing note reviewed.  Constitutional:      General: She is not in acute distress.    Appearance: She is well-developed.  HENT:     Head: Normocephalic and atraumatic.  Eyes:     General:        Right eye: No discharge.        Left eye: No discharge.     Conjunctiva/sclera: Conjunctivae normal.  Cardiovascular:     Rate and Rhythm: Normal rate and regular rhythm.     Comments: 2+ DP pulses.  Pulmonary:      Effort: Pulmonary effort is normal.     Breath sounds: Normal breath sounds.  Abdominal:     Tenderness: There is no abdominal tenderness.  Musculoskeletal:     Comments: Surgical bandage in place-blood present on the bandage.  This was removed.  Patient has 3 surgical incision sites-2 to the foot and 1 to the posterior lower leg of the right lower extremity.  Staples are in place.  There is also a suture to the distal aspect of one of the surgical incision sites on the dorsum of the foot.  There is no active bleeding, specifically no pulsatile bleeding.  No increased erythema or purulent drainage.  She has good capillary refill to all digits.  Compartments are soft.  Skin:    General: Skin is warm and dry.     Capillary Refill: Capillary refill takes less than 2 seconds.  Neurological:     Mental Status: She is alert.     Comments: Clear speech.   Psychiatric:        Behavior: Behavior normal.        Thought Content: Thought content normal.    ED Results / Procedures / Treatments   Labs (all labs ordered are listed, but only abnormal results are displayed) Labs Reviewed - No data to display  EKG None  Radiology DG Foot 2 Views Left  Result Date: 10/29/2021 CLINICAL DATA:  Status post foot surgery. EXAM: LEFT FOOT - 2 VIEW COMPARISON:  None. FINDINGS: The patient is status post fusion of the talonavicular joint and subtalar arthrodesis. Hardware is in good position. Skin staples are noted. IMPRESSION: Postsurgical changes as above. Electronically Signed   By: Dorise Bullion III M.D.   On: 10/29/2021 12:02   DG C-Arm 1-60 Min-No Report  Result Date: 10/29/2021 Fluoroscopy was utilized by the requesting physician.  No radiographic interpretation.   DG C-Arm 1-60 Min-No Report  Result Date: 10/29/2021 Fluoroscopy was utilized by the requesting physician.  No radiographic interpretation.   DG C-Arm 1-60 Min-No Report  Result Date: 10/29/2021 Fluoroscopy was utilized by the  requesting physician.  No radiographic interpretation.    Procedures Procedures    Medications Ordered in ED Medications - No data to display  ED Course/ Medical Decision Making/ A&P                           Medical Decision Making  Patient presents to the ED with complaints of post op wound bleeding, this involves an extensive number of treatment options, and is a complaint that carries with it a high risk of complications and morbidity. Nontoxic, vitals mild tachycardia & HTN.    Additional history obtained:  S/p  fusion of the talonavicular joint and a subtalar joint arthrodesis of the RLE by Dr. Posey Pronto this AM.   ED Course:  Initial assessment patient has bloodsoaked bandage in place, some of which is dried some of which still feels damp therefore postop dressing was removed to better assess the wound.  Patient has 3 surgical incision sites with staples/suture in place.  There is no pulsatile bleeding, no significant active bleeding.  Patient has good cap refill and good pulses to the foot, compartments are soft.  Wound was redressed using similar materials to that which was placed after surgery.  Observed in the emergency department without recurrence of bleeding through the dressing.  Patient overall appears reasonable for discharge, advised her to call her surgeon first thing in the morning to schedule close follow-up. I discussed treatment plan, need for follow-up, and return precautions with the patient. Provided opportunity for questions, patient confirmed understanding and is in agreement with plan. Discussed w/ attending Dr. Leonette Monarch- in agreement.   Portions of this note were generated with Lobbyist. Dictation errors may occur despite best attempts at proofreading.   Final Clinical Impression(s) / ED Diagnoses Final diagnoses:  Bleeding from wound    Rx / DC Orders ED Discharge Orders     None         Amaryllis Dyke, PA-C 10/30/21 0551     Fatima Blank, MD 10/30/21 [phone]

## 2021-10-30 NOTE — Telephone Encounter (Signed)
Attempted to contact patient to see if she is experiencing any bleeding and needs to be seen. Per Dr. Posey Pronto patient can come in on 3/1. Unable to leave a vm. I did attempt to call mother on the number that is listed in Epic as well as on the [facility]. Left a general message on mothers vm.

## 2021-10-31 ENCOUNTER — Encounter (HOSPITAL_BASED_OUTPATIENT_CLINIC_OR_DEPARTMENT_OTHER): Payer: Self-pay | Admitting: Podiatry

## 2021-10-31 ENCOUNTER — Telehealth: Payer: Self-pay | Admitting: Podiatry

## 2021-10-31 NOTE — Telephone Encounter (Signed)
Called and spoke with patient, she said that she thinks that the bleeding has resolved but will not know for sure until her aide comes in later today to check it, will call back if it hasn't.

## 2021-10-31 NOTE — Telephone Encounter (Signed)
Pt called stating her foot is not bleeding. She stated her friend is changing her bandage (The friend is a cna). ? ?

## 2021-11-01 NOTE — Op Note (Signed)
Surgeon: Surgeon(s): Felipa Furnace, DPM  Assistants: None Pre-operative diagnosis: ARTHRITIS LEFT FOOT  Post-operative diagnosis: same Procedure: Procedure(s) (LRB): FUSION OF TALONAVICULAR JOINT (Left) SUBTALAR JOINT ARTHRODESIS (Left)  Pathology: * No specimens in log *  Pertinent Intra-op findings: Significant arthritis noted in the talonavicular joint as well as subtalar joint. Anesthesia: General  Hemostasis:  Total Tourniquet Time Documented: Thigh (Left) - 121 minutes Total: Thigh (Left) - 121 minutes  EBL: 100 cc Materials: 304 0 Monocryl, 3-0 Prolene, skin staples, 7.0 mm Acumed screw x1 and Crossroads staple and 4.5 millimeter screw x1 each, bone cancellous and bone putty Injectables: None Complications: None  Indications for surgery: A 46 y.o. female presents with significant left subtalar joint arthritis and talonavicular joint right is painful. Patient has failed all conservative therapy including but not limited to injection, orthotics, cam boot immobilization h wishes to have surgical correction of the foot/deformity. It was determined that patient would benefit from left talonavicular joint and subtalar joint fusion with fixation. Informed surgical risk consent was reviewed and read aloud to the patient.  I reviewed the films.  I have discussed my findings with the patient in great detail.  I have discussed all risks including but not limited to infection, stiffness, scarring, limp, disability, deformity, damage to blood vessels and nerves, numbness, poor healing, need for braces, arthritis, chronic pain, amputation, death.  All benefits and realistic expectations discussed in great detail.  I have made no promises as to the outcome.  I have provided realistic expectations.  I have offered the patient a 2nd opinion, which they have declined and assured me they preferred to proceed despite the risks   Procedure in detail: The patient was both verbally and visually  identified by myself, the nursing staff, and anesthesia staff in the preoperative holding area. They were then transferred to the operating room and placed on the operative table in supine position.  After appropriate consents were obtained, the patient was taken to the operating room and placed under general and is tracheal anesthesia. The left lower extremity was prepped and draped in sterile fashion. A tourniquet was placed around the left calf and inflated to 250 mm Hg after exsanguination with an Esmarch bandage.   A horizontal incision was delineated laterally underneath the fibula from the tip of the lateral malleolus to the fourth metatarsal base, using a #15 blade incision was carried down from skin down to the dermal junction to subcu.  Using Metzenbaums for blunt and sharp dissection.  Peroneal tendons were retracted inferiorly.  At this time a Incision Was Made to Expose the Subtalar Joint.  Upon Exposure Severe Arthritis Noted at the Subtalar Joint Posterior Facet.  Using Osteotome Mallet/Bar the Entire Cartilage Was Completely Removed.  The Subchondral Bone Was Penetrated with Multiple Drill Bits.  At This Time Subchondral Cancellous Bone/Bone Putty Was Added to Allow for Maximal Fusion.  Once the Subtalar Joint Was Reduced a Temporary Wire Was Placed to Hold It in Place.  At this time using intraoperative fluoroscopy 7.0 mm headless screw compression was inserted in standard technique.  Good placement of the hardware was noted.  At this time it was determined that patient will benefit from 1 screw placement as 2 points of fixation given the size of the screw.  Calc axial axial showed good alignment of the hindfoot as well.  Once a high follow-up was aligned the attention was focused to the medial foot for talonavicular joint fusion.  A longitudinal incision was made  medially just distal to the medial malleolus and along the medial column of the foot. Sharp dissection was carried down through the  skin and subcutaneous tissues down to the level of the interval between the tibialis post into the tendons. The interval was explored proximally and distally and provided access to the talonavicular and naviculocuneiform joints. Access to the talonavicular joint was then obtained and debridement of the cartilage surfaces was performed. Subchondral bone was penetrated both on the talar head and on the navicular and good removal of the cartilage was noted. Following complete cartilage removal, the subchondral bone was penetrated using multiple drill bits and osteotomes to feather the surfaces as well as to provide good bleeding subchondral bone. The talonavicular joint was then secured using 0.062 guidewires for the Acumed 4.14mm headless screw system. The guidewire was secured, and a Acumed headless 4.5 mm diameter screw from the Acumed set was placed across the guidewires providing good compression to the talonavicular joint. A Crossroads staple was then placed on the dorsal lateral surface to provide lateral compression. Excellent compression was noted across the fusion site. Good placement of the hardware was noted under fluoroscopic visualization in the AP and lateral planes of the foot. Wounds were then copiously irrigated with normal saline and closed in layers with 2-0 and 3-0 monocryl and skin closed with 3-0 prolene/skin staples. Sterile dressings were applied, followed placement in CAM boot Non-weightbearing. The patient was awakened and taken to recovery room in good condition. The patient tolerated the procedure well. All counts were correct. There were no complications.   At the conclusion of the procedure the patient was awoken from anesthesia and found to have tolerated the procedure well any complications. There were transferred to PACU with vital signs stable and vascular status intact.  Boneta Lucks, DPM

## 2021-11-07 ENCOUNTER — Ambulatory Visit (INDEPENDENT_AMBULATORY_CARE_PROVIDER_SITE_OTHER): Payer: Medicare Other | Admitting: Podiatry

## 2021-11-07 ENCOUNTER — Other Ambulatory Visit: Payer: Self-pay

## 2021-11-07 ENCOUNTER — Ambulatory Visit (INDEPENDENT_AMBULATORY_CARE_PROVIDER_SITE_OTHER): Payer: Medicare Other

## 2021-11-07 DIAGNOSIS — M19072 Primary osteoarthritis, left ankle and foot: Secondary | ICD-10-CM

## 2021-11-07 DIAGNOSIS — M24572 Contracture, left ankle: Secondary | ICD-10-CM

## 2021-11-07 DIAGNOSIS — M19079 Primary osteoarthritis, unspecified ankle and foot: Secondary | ICD-10-CM

## 2021-11-07 DIAGNOSIS — Z9889 Other specified postprocedural states: Secondary | ICD-10-CM

## 2021-11-08 ENCOUNTER — Encounter: Payer: Self-pay | Admitting: Podiatry

## 2021-11-08 DIAGNOSIS — F330 Major depressive disorder, recurrent, mild: Secondary | ICD-10-CM | POA: Diagnosis not present

## 2021-11-08 DIAGNOSIS — F411 Generalized anxiety disorder: Secondary | ICD-10-CM | POA: Diagnosis not present

## 2021-11-08 NOTE — Progress Notes (Signed)
?Subjective:  ?Patient ID: Michelle Bell, female    DOB: Mar 03, 1976,  MRN: [id number] ? ?Chief Complaint  ?Patient presents with  ? Routine Post Op  ?  POV #1 DOS 10/29/2021 LT TALONAVICULAR JOINT FUSION W/SUBTARLAR JOINT FUSION  ? ? ?DOS: 10/29/2021 ?Procedure: Left talonavicular joint fusion with subtalar joint fusion and gastrocnemius recession ? ?46 y.o. female returns for post-op check.  Patient states she is doing okay.  Her pain is being managed by pain control.  She has been nonweightbearing to the left lower extremity. ? ?Review of Systems: Negative except as noted in the HPI. Denies N/V/F/Ch. ? ?Past Medical History:  ?Diagnosis Date  ? Anxiety   ? Depression   ? Hyperlipidemia, mixed   ? Hypertension   ? Iron 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)   ? previously had Iron infusion's by dr Lindi Adie  ? Menorrhagia with regular cycle   ? OA (osteoarthritis)   ? ? ?Current Outpatient Medications:  ?  acetaminophen (TYLENOL) 325 MG tablet, Take 325 mg by mouth daily as needed for moderate pain., Disp: , Rfl:  ?  amLODipine (NORVASC) 5 MG tablet, Take 5 mg by mouth daily., Disp: , Rfl:  ?  aspirin EC 81 MG tablet, Take 81 mg by mouth daily., Disp: , Rfl:  ?  diclofenac Sodium (VOLTAREN) 1 % GEL, Apply topically 4 (four) times daily., Disp: , Rfl:  ?  ferrous sulfate 325 (65 FE) MG tablet, Take 325 mg by mouth daily with breakfast., Disp: , Rfl:  ?  gabapentin (NEURONTIN) 300 MG capsule, Take 300 mg by mouth at bedtime., Disp: , Rfl:  ?  hydrochlorothiazide (HYDRODIURIL) 25 MG tablet, Take 25 mg by mouth daily. (Patient not taking: Reported on 10/29/2021), Disp: , Rfl:  ?  ibuprofen (ADVIL) 800 MG tablet, Take 1 tablet (800 mg total) by mouth every 6 (six) hours as needed., Disp: 60 tablet, Rfl: 1 ?  ibuprofen (ADVIL,MOTRIN) 200 MG tablet, Take 200 mg by mouth daily as needed for headache or moderate pain., Disp: , Rfl:  ?  meloxicam (MOBIC) 7.5 MG tablet, Take 7.5 mg by mouth daily. (Patient not  taking: Reported on 10/29/2021), Disp: , Rfl:  ?  Multiple Vitamin (MULTIVITAMIN WITH MINERALS) TABS tablet, Take 1 tablet by mouth daily., Disp: , Rfl:  ?  nitroGLYCERIN (NITROGLYN) 2 % ointment, Apply 0.5 inches topically every 6 (six) hours. (Patient not taking: No sig reported), Disp: 30 g, Rfl: 0 ?  ondansetron (ZOFRAN) 4 MG tablet, Take 1 tablet (4 mg total) by mouth every 8 (eight) hours as needed for nausea or vomiting., Disp: 20 tablet, Rfl: 0 ?  oxyCODONE-acetaminophen (PERCOCET) 5-325 MG tablet, Take 1 tablet by mouth every 4 (four) hours as needed for severe pain., Disp: 30 tablet, Rfl: 0 ? ?Social History  ? ?Tobacco Use  ?Smoking Status Never  ?Smokeless Tobacco Never  ? ? ?No Known Allergies ?Objective:  ?There were no vitals filed for this visit. ?There is no height or weight on file to calculate BMI. ?Constitutional Well developed. ?Well nourished.  ?Vascular Foot warm and well perfused. ?Capillary refill normal to all digits.   ?Neurologic Normal speech. ?Oriented to person, place, and time. ?Epicritic sensation to light touch grossly present bilaterally.  ?Dermatologic Skin healing well without signs of infection. Skin edges well coapted without signs of infection.  ?Orthopedic: Tenderness to palpation noted about the surgical site.  ? ?Radiographs: 3 views of skeletally mature adult left foot: Hardware is intact  no signs of loosening or backing out noted.  Good correction alignment noted. ?Assessment:  ? ?1. Osteoarthritis of talonavicular joint   ?2. Arthritis of left subtalar joint   ?3. Equinus contracture of left ankle   ?4. Status post foot surgery   ? ?Plan:  ?Patient was evaluated and treated and all questions answered. ? ?S/p foot surgery left ?-Progressing as expected post-operatively. ?-XR: See above ?-WB Status: Nonweightbearing in left lower extremity ?-Sutures: Intact.  No clinical signs of Deis is noted no complication noted. ?-Medications: None ?-Foot redressed. ? ?No follow-ups on  file.  ?

## 2021-11-19 ENCOUNTER — Other Ambulatory Visit: Payer: Self-pay | Admitting: Sports Medicine

## 2021-11-19 ENCOUNTER — Telehealth: Payer: Self-pay | Admitting: Podiatry

## 2021-11-19 MED ORDER — OXYCODONE-ACETAMINOPHEN 5-325 MG PO TABS: 1.0000 | ORAL_TABLET | ORAL | 0 refills | Status: DC | PRN

## 2021-11-19 NOTE — Telephone Encounter (Signed)
Pharmacy [facility] on [address] ([place])  Medication oxyCODONE-acetaminophen (PERCOCET) 5-325 MG tablet  Patient is requesting refill on pain medication

## 2021-11-19 NOTE — Progress Notes (Signed)
Refilled percocet. Patient has f/u appt with Dr. Posey Pronto on 3/22 ?

## 2021-11-19 NOTE — Telephone Encounter (Signed)
Called patient and left message that medication was callled in

## 2021-11-21 ENCOUNTER — Ambulatory Visit (INDEPENDENT_AMBULATORY_CARE_PROVIDER_SITE_OTHER): Payer: Medicare Other | Admitting: Podiatry

## 2021-11-21 ENCOUNTER — Other Ambulatory Visit: Payer: Self-pay

## 2021-11-21 DIAGNOSIS — Z9889 Other specified postprocedural states: Secondary | ICD-10-CM

## 2021-11-21 DIAGNOSIS — M24572 Contracture, left ankle: Secondary | ICD-10-CM

## 2021-11-21 DIAGNOSIS — M19079 Primary osteoarthritis, unspecified ankle and foot: Secondary | ICD-10-CM

## 2021-11-21 DIAGNOSIS — M19072 Primary osteoarthritis, left ankle and foot: Secondary | ICD-10-CM

## 2021-11-21 NOTE — Progress Notes (Signed)
?Subjective:  ?Patient ID: Michelle Bell, female    DOB: 02/01/1976,  MRN: [id number] ? ?Chief Complaint  ?Patient presents with  ? Routine Post Op  ?  POV #2 DOS 10/29/2021 LT TALONAVICULAR JOINT FUSION W/SUBTARLAR JOINT FUSION  ? ? ?DOS: 10/29/2021 ?Procedure: Left talonavicular joint fusion with subtalar joint fusion and gastrocnemius recession ? ?46 y.o. female returns for post-op check.  Patient states she is doing okay.  Her pain is being managed by pain control.  She has been nonweightbearing to the left lower extremity. ? ?Review of Systems: Negative except as noted in the HPI. Denies N/V/F/Ch. ? ?Past Medical History:  ?Diagnosis Date  ? Anxiety   ? Depression   ? Hyperlipidemia, mixed   ? Hypertension   ? Iron 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)   ? previously had Iron infusion's by dr Lindi Adie  ? Menorrhagia with regular cycle   ? OA (osteoarthritis)   ? ? ?Current Outpatient Medications:  ?  acetaminophen (TYLENOL) 325 MG tablet, Take 325 mg by mouth daily as needed for moderate pain., Disp: , Rfl:  ?  amLODipine (NORVASC) 5 MG tablet, Take 5 mg by mouth daily., Disp: , Rfl:  ?  aspirin EC 81 MG tablet, Take 81 mg by mouth daily., Disp: , Rfl:  ?  diclofenac Sodium (VOLTAREN) 1 % GEL, Apply topically 4 (four) times daily., Disp: , Rfl:  ?  ferrous sulfate 325 (65 FE) MG tablet, Take 325 mg by mouth daily with breakfast., Disp: , Rfl:  ?  gabapentin (NEURONTIN) 300 MG capsule, Take 300 mg by mouth at bedtime., Disp: , Rfl:  ?  hydrochlorothiazide (HYDRODIURIL) 25 MG tablet, Take 25 mg by mouth daily. (Patient not taking: Reported on 10/29/2021), Disp: , Rfl:  ?  ibuprofen (ADVIL) 800 MG tablet, Take 1 tablet (800 mg total) by mouth every 6 (six) hours as needed., Disp: 60 tablet, Rfl: 1 ?  ibuprofen (ADVIL,MOTRIN) 200 MG tablet, Take 200 mg by mouth daily as needed for headache or moderate pain., Disp: , Rfl:  ?  meloxicam (MOBIC) 7.5 MG tablet, Take 7.5 mg by mouth daily. (Patient not  taking: Reported on 10/29/2021), Disp: , Rfl:  ?  Multiple Vitamin (MULTIVITAMIN WITH MINERALS) TABS tablet, Take 1 tablet by mouth daily., Disp: , Rfl:  ?  nitroGLYCERIN (NITROGLYN) 2 % ointment, Apply 0.5 inches topically every 6 (six) hours. (Patient not taking: No sig reported), Disp: 30 g, Rfl: 0 ?  ondansetron (ZOFRAN) 4 MG tablet, Take 1 tablet (4 mg total) by mouth every 8 (eight) hours as needed for nausea or vomiting., Disp: 20 tablet, Rfl: 0 ?  oxyCODONE-acetaminophen (PERCOCET) 5-325 MG tablet, Take 1 tablet by mouth every 4 (four) hours as needed for severe pain., Disp: 30 tablet, Rfl: 0 ? ?Social History  ? ?Tobacco Use  ?Smoking Status Never  ?Smokeless Tobacco Never  ? ? ?No Known Allergies ?Objective:  ?There were no vitals filed for this visit. ?There is no height or weight on file to calculate BMI. ?Constitutional Well developed. ?Well nourished.  ?Vascular Foot warm and well perfused. ?Capillary refill normal to all digits.   ?Neurologic Normal speech. ?Oriented to person, place, and time. ?Epicritic sensation to light touch grossly present bilaterally.  ?Dermatologic Skin completely epithelialized.  Good correction alignment noted.  ?Orthopedic: Tenderness to palpation noted about the surgical site.  ? ?Radiographs: 3 views of skeletally mature adult left foot: Hardware is intact no signs of loosening or backing out  noted.  Good correction alignment noted. ?Assessment:  ? ?1. Arthritis of left subtalar joint   ?2. Osteoarthritis of talonavicular joint   ?3. Equinus contracture of left ankle   ?4. Status post foot surgery   ? ? ?Plan:  ?Patient was evaluated and treated and all questions answered. ? ?S/p foot surgery left ?-Progressing as expected post-operatively. ?-XR: See above ?-WB Status: Nonweightbearing in left lower extremity ?-Sutures: Removed no clinical signs of Deis is noted no complication noted. ?-Medications: None ?-Foot redressed. ? ?No follow-ups on file.  ?

## 2021-12-05 DIAGNOSIS — F411 Generalized anxiety disorder: Secondary | ICD-10-CM | POA: Diagnosis not present

## 2021-12-05 DIAGNOSIS — F330 Major depressive disorder, recurrent, mild: Secondary | ICD-10-CM | POA: Diagnosis not present

## 2021-12-17 ENCOUNTER — Telehealth: Payer: Self-pay | Admitting: Podiatry

## 2021-12-17 NOTE — Telephone Encounter (Signed)
Patient called would like a refill of her oxycodone medication. [facility] uses [facility] on [address] ?

## 2021-12-18 MED ORDER — OXYCODONE-ACETAMINOPHEN 5-325 MG PO TABS: 1.0000 | ORAL_TABLET | ORAL | 0 refills | Status: DC | PRN

## 2021-12-18 NOTE — Addendum Note (Signed)
Addended by: Boneta Lucks on: 12/18/2021 07:52 AM ? ? Modules accepted: Orders ? ?

## 2021-12-19 ENCOUNTER — Ambulatory Visit (INDEPENDENT_AMBULATORY_CARE_PROVIDER_SITE_OTHER): Payer: Medicare Other

## 2021-12-19 ENCOUNTER — Ambulatory Visit (INDEPENDENT_AMBULATORY_CARE_PROVIDER_SITE_OTHER): Payer: Medicare Other | Admitting: Podiatry

## 2021-12-19 DIAGNOSIS — Z9889 Other specified postprocedural states: Secondary | ICD-10-CM

## 2021-12-19 DIAGNOSIS — M19072 Primary osteoarthritis, left ankle and foot: Secondary | ICD-10-CM

## 2021-12-19 DIAGNOSIS — Z91199 Patient's noncompliance with other medical treatment and regimen due to unspecified reason: Secondary | ICD-10-CM

## 2021-12-25 NOTE — Progress Notes (Addendum)
?Subjective:  ?Patient ID: Michelle Bell, female    DOB: [birth year],  MRN: [id number] ? ?No chief complaint on file. ? ? ?DOS: 10/29/2021 ?Procedure: Left talonavicular joint fusion with subtalar joint fusion and gastrocnemius recession ? ?46 y.o. female returns for post-op check.  Patient states she is doing okay minimal pain.  She has been ambulating with the boot on.  She has been walking.  She has not been nonweightbearing.  There is a noncompliance part of the surgery. ?Review of Systems: Negative except as noted in the HPI. Denies N/V/F/Ch. ? ?Past Medical History:  ?Diagnosis Date  ? Anxiety   ? Depression   ? Hyperlipidemia, mixed   ? Hypertension   ? Iron 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)   ? previously had Iron infusion's by dr Lindi Adie  ? Menorrhagia with regular cycle   ? OA (osteoarthritis)   ? ? ?Current Outpatient Medications:  ?  acetaminophen (TYLENOL) 325 MG tablet, Take 325 mg by mouth daily as needed for moderate pain., Disp: , Rfl:  ?  amLODipine (NORVASC) 5 MG tablet, Take 5 mg by mouth daily., Disp: , Rfl:  ?  aspirin EC 81 MG tablet, Take 81 mg by mouth daily., Disp: , Rfl:  ?  diclofenac Sodium (VOLTAREN) 1 % GEL, Apply topically 4 (four) times daily., Disp: , Rfl:  ?  ferrous sulfate 325 (65 FE) MG tablet, Take 325 mg by mouth daily with breakfast., Disp: , Rfl:  ?  gabapentin (NEURONTIN) 300 MG capsule, Take 300 mg by mouth at bedtime., Disp: , Rfl:  ?  hydrochlorothiazide (HYDRODIURIL) 25 MG tablet, Take 25 mg by mouth daily. (Patient not taking: Reported on 10/29/2021), Disp: , Rfl:  ?  ibuprofen (ADVIL) 800 MG tablet, Take 1 tablet (800 mg total) by mouth every 6 (six) hours as needed., Disp: 60 tablet, Rfl: 1 ?  ibuprofen (ADVIL,MOTRIN) 200 MG tablet, Take 200 mg by mouth daily as needed for headache or moderate pain., Disp: , Rfl:  ?  meloxicam (MOBIC) 7.5 MG tablet, Take 7.5 mg by mouth daily. (Patient not taking: Reported on 10/29/2021), Disp: , Rfl:  ?  Multiple  Vitamin (MULTIVITAMIN WITH MINERALS) TABS tablet, Take 1 tablet by mouth daily., Disp: , Rfl:  ?  nitroGLYCERIN (NITROGLYN) 2 % ointment, Apply 0.5 inches topically every 6 (six) hours. (Patient not taking: No sig reported), Disp: 30 g, Rfl: 0 ?  ondansetron (ZOFRAN) 4 MG tablet, Take 1 tablet (4 mg total) by mouth every 8 (eight) hours as needed for nausea or vomiting., Disp: 20 tablet, Rfl: 0 ?  oxyCODONE-acetaminophen (PERCOCET) 5-325 MG tablet, Take 1 tablet by mouth every 4 (four) hours as needed for severe pain., Disp: 30 tablet, Rfl: 0 ?  oxyCODONE-acetaminophen (PERCOCET) 5-325 MG tablet, Take 1 tablet by mouth every 4 (four) hours as needed for severe pain., Disp: 30 tablet, Rfl: 0 ? ?Social History  ? ?Tobacco Use  ?Smoking Status Never  ?Smokeless Tobacco Never  ? ? ?No Known Allergies ?Objective:  ?There were no vitals filed for this visit. ?There is no height or weight on file to calculate BMI. ?Constitutional Well developed. ?Well nourished.  ?Vascular Foot warm and well perfused. ?Capillary refill normal to all digits.   ?Neurologic Normal speech. ?Oriented to person, place, and time. ?Epicritic sensation to light touch grossly present bilaterally.  ?Dermatologic Skin completely epithelialized.  Good correction alignment noted.  ?Orthopedic: Mild tenderness to palpation noted about the surgical site.  ? ?Radiographs: 3  views of skeletally mature adult left foot: Hardware is intact no signs of loosening or backing out noted.  Good correction alignment noted.  As of now the hardware is holding we will continue to monitor for consolidation.  There are some consolidation noted. ?Assessment:  ? ?1. Arthritis of left subtalar joint   ?2. Noncompliance   ?3. Status post foot surgery   ? ? ?Plan:  ?Patient was evaluated and treated and all questions answered. ? ?S/p foot surgery left ?-Progressing as expected post-operatively. ?-XR: See above ?-WB Status: She can now begin transitioning to weightbearing as  tolerated in cam boot as she has already been doing that ?-Sutures: Removed no clinical signs of Deis is noted no complication noted. ?-Medications: None ?-Patient has been noncompliant with nonweightbearing status and was ambulating with cam boot. ?-I will also start her on physical therapy.  Physical therapy prescription was given ? ?No follow-ups on file.  ?

## 2021-12-28 ENCOUNTER — Ambulatory Visit: Payer: Medicare Other | Attending: Physician Assistant | Admitting: Physical Therapy

## 2021-12-28 ENCOUNTER — Encounter: Payer: Self-pay | Admitting: Physical Therapy

## 2021-12-28 DIAGNOSIS — R600 Localized edema: Secondary | ICD-10-CM | POA: Diagnosis not present

## 2021-12-28 DIAGNOSIS — M25672 Stiffness of left ankle, not elsewhere classified: Secondary | ICD-10-CM | POA: Diagnosis not present

## 2021-12-28 DIAGNOSIS — Z981 Arthrodesis status: Secondary | ICD-10-CM | POA: Diagnosis not present

## 2021-12-28 DIAGNOSIS — R262 Difficulty in walking, not elsewhere classified: Secondary | ICD-10-CM | POA: Insufficient documentation

## 2021-12-28 NOTE — Therapy (Signed)
?OUTPATIENT PHYSICAL THERAPY LOWER EXTREMITY EVALUATION ? ? ?Patient Name: Michelle Bell ?MRN: [id number] ?DOB:1976/09/02, 46 y.o., female ?Today's Date: 12/28/2021 ? ? PT End of Session - 12/28/21 0941   ? ? Visit Number 1   ? Number of Visits 16   ? Date for PT Re-Evaluation 02/22/22   ? PT Start Time 2025   ? PT Stop Time 1020   ? PT Time Calculation (min) 45 min   ? Activity Tolerance Patient tolerated treatment well   ? Behavior During Therapy [facility] for tasks assessed/performed   ? ?  ?  ? ?  ? ? ?Past Medical History:  ?Diagnosis Date  ? Anxiety   ? Depression   ? Hyperlipidemia, mixed   ? Hypertension   ? Iron 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)   ? previously had Iron infusion's by dr Lindi Adie  ? Menorrhagia with regular cycle   ? OA (osteoarthritis)   ? ?Past Surgical History:  ?Procedure Laterality Date  ? BREAST REDUCTION SURGERY Bilateral 01/31/2021  ? Procedure: MAMMARY REDUCTION  (BREAST);  Surgeon: Wallace Going, DO;  Location: [place];  Service: Plastics;  Laterality: Bilateral;  ? CESAREAN SECTION    ? FUSION OF TALONAVICULAR JOINT Left 10/29/2021  ? Procedure: FUSION OF TALONAVICULAR JOINT;  Surgeon: Felipa Furnace, DPM;  Location: [place];  Service: Podiatry;  Laterality: Left;  GENERAL WITH BLOCK  ? MASS EXCISION Left 12/23/2016  ? Procedure: EXCISION LEFT GROIN MASS;  Surgeon: Coralie Keens, MD;  Location: [facility];  Service: General;  Laterality: Left;  ? SUBTALAR JOINT ARTHROEREISIS Left 10/29/2021  ? Procedure: SUBTALAR JOINT ARTHRODESIS;  Surgeon: Felipa Furnace, DPM;  Location: [place];  Service: Podiatry;  Laterality: Left;  ? ?Patient Active Problem List  ? Diagnosis Date Noted  ? Back pain 06/09/2020  ? Neck pain 06/09/2020  ? Symptomatic mammary hypertrophy 06/09/2020  ? Iron deficiency anemia 10/14/2018  ? ? ?PCP: Raelyn Number ? ?REFERRING PROVIDER:  ?Felipa Furnace, DPM  ? ?REFERRING DIAG: Z98.1 (ICD-10-CM) -  S/P ankle fusion  ? ?THERAPY DIAG:  ?Status post ankle fusion ? ?Localized edema ? ?Difficulty in walking, not elsewhere classified ? ?Stiffness of left ankle, not elsewhere classified ? ?ONSET DATE: 10/29/21 ? ?SUBJECTIVE:  ? ?SUBJECTIVE STATEMENT: ?Patient underwent surgery 10/29/21.  She reports she was only NWB for 2 weeks. She is now WBAT with boot, was supposed to have been NWB until about 1-2 weeks ago. She is overall doing well.  She has not been doing any exercises with her foot. " I still feel the pressure when I walk but it is not as bad as it was" ? ?She only uses the cane out of the house but boot is whenever she is up.  She has difficulty walking and getting up and down from the chair.  She can't stand to cook or do ADLs for too long.  Her balance is  "kinda off" and can't stand > 15-20 min and does not push past that. She is unable to work currently and engage in recreation.  ? ?PERTINENT HISTORY: ?10/29/21 Left talonavicular joint fusion with subtalar joint fusion and gastrocnemius recession  ? ?PAIN:  ?Are you having pain? Yes: NPRS scale: 6/10 ?Pain location: LT medial ankle/foot and heel  ?Pain description: sharp, pressure , feels the pin with steps  ?Aggravating factors: walking, weight bearing  ?Relieving factors: Ibuprofen, positioning, ice  ? ?PRECAUTIONS: Other: none  ? ?  WEIGHT BEARING RESTRICTIONS Yes WBAT ? ?FALLS:  ?Has patient fallen in last 6 months? No ?Has lost her balance but attributes to the crutch  ? ? ?LIVING ENVIRONMENT: ?Lives with: lives alone ?Lives in: House/apartment ?Stairs: Yes: External: 14 steps; bilateral but cannot reach both ?Has following equipment at home: Single point cane and Crutcheshas access to a walker with a seat ? ?OCCUPATION: unemployed ? ?PLOF: Independent ? ?PATIENT GOALS Pt would like to be do what I was doing so [facility].  She used to walk on the treadmill.  ? ? ?OBJECTIVE:  ? ?DIAGNOSTIC FINDINGS: Radiographs: 3 views of skeletally mature  adult left foot: Hardware is intact no signs of loosening or backing out noted.  Good correction alignment noted.   There are some consolidation noted.  ? ?PATIENT SURVEYS:  ?FOTO 29% ? ?COGNITION: ? Overall cognitive status: Within functional limits for tasks assessed   ?  ?SENSATION: ?Light touch: Impaired numb medial ankle  ? ?POSTURE:  ?Standings with LLE abducted and ER to accommodate boot ? ?PALPATION: ?Pain with palpation to L dorsum of foot and medially along scar, wrapping posteriorly to heel  ? ?LE ROM: ? ?Active /PROM Right ?12/28/2021 Left ?12/28/2021  ?Hip flexion    ?Hip extension    ?Hip abduction    ?Hip adduction    ?Hip internal rotation    ?Hip external rotation    ?Knee flexion    ?Knee extension    ?Ankle dorsiflexion 10 +6  ?Ankle plantarflexion  16  ?Ankle inversion 42 0/0  ?Ankle eversion 10 8/12  ? (Blank rows = not tested) ? ?LE MMT: ? ?MMT Right ?12/28/2021 Left ?12/28/2021  ?Hip flexion 4 4  ?Hip extension    ?Hip abduction    ?Hip adduction    ?Hip internal rotation    ?Hip external rotation    ?Knee flexion 5 5  ?Knee extension 5 5  ?Ankle dorsiflexion 5 5  ?Ankle plantarflexion 4 4-  ?Ankle inversion 5 2-  ?Ankle eversion 5 2-  ? (Blank rows = not tested) ? ? ?FUNCTIONAL TESTS:  ?NT ? ?GAIT: ?Distance walked: 150 ?Assistive device utilized: Single point cane ?Level of assistance: Modified independence ?Comments: LLE abducted, external rotation of hip  ? ? ? ?TODAY'S TREATMENT: ?PT/POC, HEP  ? ? ?PATIENT EDUCATION:  ?Education details: HEP, POC  ?Person educated: Patient ?Education method: Explanation, Demonstration, and Handouts ?Education comprehension: verbalized understanding, returned demonstration, verbal cues required, tactile cues required, and needs further education ? ? ?HOME EXERCISE PROGRAM: ?Access Code: [email or web address] ?URL: [email or web address] ?Date: 12/28/2021 ?Prepared by: Raeford Razor ? ?Exercises ?- Seated Toe Towel Scrunches  - 2-3 x daily - 7 x weekly - 2 sets  - 10 reps - 5 hold ?- Seated Heel Raise  - 2-3 x daily - 7 x weekly - 2 sets - 10 reps - 5 hold ?- Supine Ankle Circles  - 2-3 x daily - 7 x weekly - 1 sets - 5 reps - 30 hold ?- Supine Single Leg Ankle Pumps  - 2-3 x daily - 7 x weekly - 2 sets - 10 reps - 5 hold ?- Seated Gastroc Stretch with Strap  - 2-3 x daily - 7 x weekly - 1 sets - 5 reps - 30 hold ? ?ASSESSMENT: ? ?CLINICAL IMPRESSION: ?Patient is a 46 y.o. female  who was seen today for physical therapy evaluation and treatment for Lt foot/ankle surgery. Per XR her ankle is healing but she  continues to have significant pain in foot an ankle with weightbearing, limited ADL's and mobility.  ? ? ?OBJECTIVE IMPAIRMENTS Abnormal gait, decreased activity tolerance, decreased balance, decreased mobility, difficulty walking, decreased ROM, decreased strength, hypomobility, increased edema, increased fascial restrictions, impaired flexibility, postural dysfunction, obesity, and pain.  ? ?ACTIVITY LIMITATIONS cleaning, community activity, meal prep, occupation, laundry, and shopping.  ? ?PERSONAL FACTORS Time since onset of injury/illness/exacerbation and 1-2 comorbidities: OA, depression  are also affecting patient's functional outcome.  ? ? ?REHAB POTENTIAL: Excellent ? ?CLINICAL DECISION MAKING: Stable/uncomplicated ? ?EVALUATION COMPLEXITY: Low ? ? ?GOALS: ?Goals reviewed with patient? No ? ?SHORT TERM GOALS: Target date: 01/25/2022 ? ?Pt will be able to show independence with HEP for LE strength and ROM  ?Baseline: ?Goal status: INITIAL ? ?2.  Pt will be able to demo increased ability to weight bear through LLE with transfers and gait  ?Baseline:  ?Goal status: INITIAL ? ?3.  Pt will verbalize measures to reduce swelling and pain at home  ?Baseline:  ?Goal status: INITIAL ? ? ?LONG TERM GOALS: Target date: 02/22/2022 ? ?Pt will be able to walk without the cane and min noticeable limp for 500 feet in the clinic ?Baseline:  ?Goal status: INITIAL ? ?2.  Pt will be  able to improve FOTO score to > 60% to demo improved functional mobility.  ?Baseline:  ?Goal status: INITIAL ? ?3.  Pt will be able to negotiate stairs (14 +) with 1 rail reciprocally and min pain in LLE

## 2022-01-01 ENCOUNTER — Ambulatory Visit: Payer: Medicare Other | Admitting: Physical Therapy

## 2022-01-03 DIAGNOSIS — F330 Major depressive disorder, recurrent, mild: Secondary | ICD-10-CM | POA: Diagnosis not present

## 2022-01-03 DIAGNOSIS — F411 Generalized anxiety disorder: Secondary | ICD-10-CM | POA: Diagnosis not present

## 2022-01-04 ENCOUNTER — Encounter: Payer: Medicare Other | Admitting: Physical Therapy

## 2022-01-08 ENCOUNTER — Ambulatory Visit: Payer: Medicare Other | Admitting: Physical Therapy

## 2022-01-10 NOTE — Therapy (Signed)
?OUTPATIENT PHYSICAL THERAPY TREATMENT NOTE ? ? ?Patient Name: Michelle Bell ?MRN: [id number] ?DOB:Feb 24, 1976, 46 y.o., female ?Today's Date: 01/11/2022 ? ? ?PCP/REFERRING PROVIDER: Raelyn Number, PA ? ?END OF SESSION:  ? PT End of Session - 01/11/22 1009   ? ? Visit Number 2   ? Number of Visits 16   ? Date for PT Re-Evaluation 02/22/22   ? PT Start Time 1014   ? PT Stop Time 1107   ? PT Time Calculation (min) 53 min   ? Activity Tolerance Patient tolerated treatment well   ? Behavior During Therapy [facility] for tasks assessed/performed   ? ?  ?  ? ?  ? ? ?Past Medical History:  ?Diagnosis Date  ? Anxiety   ? Depression   ? Hyperlipidemia, mixed   ? Hypertension   ? Iron 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)   ? previously had Iron infusion's by dr Lindi Adie  ? Menorrhagia with regular cycle   ? OA (osteoarthritis)   ? ?Past Surgical History:  ?Procedure Laterality Date  ? BREAST REDUCTION SURGERY Bilateral 01/31/2021  ? Procedure: MAMMARY REDUCTION  (BREAST);  Surgeon: Wallace Going, DO;  Location: [place];  Service: Plastics;  Laterality: Bilateral;  ? CESAREAN SECTION    ? FUSION OF TALONAVICULAR JOINT Left 10/29/2021  ? Procedure: FUSION OF TALONAVICULAR JOINT;  Surgeon: Felipa Furnace, DPM;  Location: [place];  Service: Podiatry;  Laterality: Left;  GENERAL WITH BLOCK  ? MASS EXCISION Left 12/23/2016  ? Procedure: EXCISION LEFT GROIN MASS;  Surgeon: Coralie Keens, MD;  Location[facility];  Service: General;  Laterality: Left;  ? SUBTALAR JOINT ARTHROEREISIS Left 10/29/2021  ? Procedure: SUBTALAR JOINT ARTHRODESIS;  Surgeon: Felipa Furnace, DPM;  Location: [place];  Service: Podiatry;  Laterality: Left;  ? ?Patient Active Problem List  ? Diagnosis Date Noted  ? Back pain 06/09/2020  ? Neck pain 06/09/2020  ? Symptomatic mammary hypertrophy 06/09/2020  ? Iron deficiency anemia 10/14/2018  ? ? ?REFERRING DIAG: ankle fusion ? ?THERAPY DIAG:   ?Status post ankle fusion ? ?Localized edema ? ?Difficulty in walking, not elsewhere classified ? ?Stiffness of left ankle, not elsewhere classified ? ?PERTINENT HISTORY: see snapshot  ? ?PRECAUTIONS: CAM boot WBAT  ? ?SUBJECTIVE: It swells when I am up on it too much.  She reports being up and down during the day, about 6 pm she needs to sit.   ? ?PAIN:  ?Are you having pain? Yes: NPRS scale: 0/10 ?Pain location: L ankle  ?Pain description: tight  ?Aggravating factors: standing too long  ?Relieving factors: rest,elevation ? ? ?OBJECTIVE: (objective measures completed at initial evaluation unless otherwise dated) ? ?DIAGNOSTIC FINDINGS: Radiographs: 3 views of skeletally mature adult left foot: Hardware is intact no signs of loosening or backing out noted.  Good correction alignment noted.   There are some consolidation noted.  ?  ?PATIENT SURVEYS:  ?FOTO 29% ?  ?COGNITION: ?          Overall cognitive status: Within functional limits for tasks assessed               ?           ?SENSATION: ?Light touch: Impaired numb medial ankle  ?  ?POSTURE:  ?Standings with LLE abducted and ER to accommodate boot ?  ?PALPATION: ?Pain with palpation to L dorsum of foot and medially along scar, wrapping posteriorly to heel  ?  ?LE  ROM: ?  ?Active /PROM Right ?12/28/2021 Left ?12/28/2021  ?Hip flexion      ?Hip extension      ?Hip abduction      ?Hip adduction      ?Hip internal rotation      ?Hip external rotation      ?Knee flexion      ?Knee extension      ?Ankle dorsiflexion 10 +6  ?Ankle plantarflexion   16  ?Ankle inversion 42 0/0  ?Ankle eversion 10 8/12  ? (Blank rows = not tested) ?  ?LE MMT: ?  ?MMT Right ?12/28/2021 Left ?12/28/2021  ?Hip flexion 4 4  ?Hip extension      ?Hip abduction      ?Hip adduction      ?Hip internal rotation      ?Hip external rotation      ?Knee flexion 5 5  ?Knee extension 5 5  ?Ankle dorsiflexion 5 5  ?Ankle plantarflexion 4 4-  ?Ankle inversion 5 2-  ?Ankle eversion 5 2-  ? (Blank rows = not  tested) ?  ?  ?FUNCTIONAL TESTS:  ?NT ?  ?GAIT: ?Distance walked: 150 ?Assistive device utilized: Single point cane ?Level of assistance: Modified independence ?Comments: LLE abducted, external rotation of hip  ?  ?  ?  ?TODAY'S TREATMENT: ?PT/POC, HEP  ?  ? ?[facility] Adult PT Treatment:                                                DATE: 01/11/22 ?Therapeutic Exercise: ?Seated AROM all planes, circles ?Calf stretch with towel  ?Seated heel raises x 20  ?Seated toe raises x 20  ?Towel scrunches 1 min.  ?Towel inversion/eversion very limited inversion about 1 min each direction ?Supine ankle theraband red with PT assist: DF/PF, INV and EV x 15 each  ? ?Manual Therapy: ?Soft tissue ,PROM to L ankle , edema mgmt  ?Modalities: ?Vaso 15 min with elevation, coldest setting, mod compression  ? ?PATIENT EDUCATION:  ?Education details: HEP, POC  ?Person educated: Patient ?Education method: Explanation, Demonstration, and Handouts ?Education comprehension: verbalized understanding, returned demonstration, verbal cues required, tactile cues required, and needs further education ?  ?  ?HOME EXERCISE PROGRAM: ?Access Code: [email or web address] ?URL: [email or web address] ?Date: 12/28/2021 ?Prepared by: Raeford Razor ?  ?Exercises ?- Seated Toe Towel Scrunches  - 2-3 x daily - 7 x weekly - 2 sets - 10 reps - 5 hold ?- Seated Heel Raise  - 2-3 x daily - 7 x weekly - 2 sets - 10 reps - 5 hold ?- Supine Ankle Circles  - 2-3 x daily - 7 x weekly - 1 sets - 5 reps - 30 hold ?- Supine Single Leg Ankle Pumps  - 2-3 x daily - 7 x weekly - 2 sets - 10 reps - 5 hold ?- Seated Gastroc Stretch with Strap  - 2-3 x daily - 7 x weekly - 1 sets - 5 reps - 30 hold ?  ?ASSESSMENT: ?  ?CLINICAL IMPRESSION: ? Patient tolerated session well.  Address ankle ROM and strength with exercises and edema mgmt.  Significant swelling present medal and lateral ankle. Asked her to bring her shoe for Nustep and gait training if possible.  ?  ?OBJECTIVE IMPAIRMENTS  Abnormal gait, decreased activity tolerance, decreased balance, decreased mobility, difficulty walking, decreased ROM, decreased strength, hypomobility,  increased edema, increased fascial restrictions, impaired flexibility, postural dysfunction, obesity, and pain.  ?  ?ACTIVITY LIMITATIONS cleaning, community activity, meal prep, occupation, laundry, and shopping.  ?  ?PERSONAL FACTORS Time since onset of injury/illness/exacerbation and 1-2 comorbidities: OA, depression  are also affecting patient's functional outcome.  ?  ?  ?REHAB POTENTIAL: Excellent ?  ?CLINICAL DECISION MAKING: Stable/uncomplicated ?  ?EVALUATION COMPLEXITY: Low ?  ?  ?GOALS: ?Goals reviewed with patient? No ?  ?SHORT TERM GOALS: Target date: 01/25/2022 ?  ?Pt will be able to show independence with HEP for LE strength and ROM  ?Baseline: ?Goal status: INITIAL ?  ?2.  Pt will be able to demo increased ability to weight bear through LLE with transfers and gait  ?Baseline:  ?Goal status: INITIAL ?  ?3.  Pt will verbalize measures to reduce swelling and pain at home  ?Baseline:  ?Goal status: INITIAL ?  ?  ?LONG TERM GOALS: Target date: 02/22/2022 ?  ?Pt will be able to walk without the cane and min noticeable limp for 500 feet in the clinic ?Baseline:  ?Goal status: INITIAL ?  ?2.  Pt will be able to improve FOTO score to > 60% to demo improved functional mobility.  ?Baseline:  ?Goal status: INITIAL ?  ?3.  Pt will be able to negotiate stairs (14 +) with 1 rail reciprocally and min pain in LLE  ?Baseline:  ?Goal status: INITIAL ?  ?4.  Pt will be able to perform static balance exercises (SLS, tandem) for 30 sec at a time with light UE support ?Baseline:  ?Goal status: INITIAL ?  ?5.  Further goals TBA  ?Baseline:  ?Goal status: INITIAL ?  ?  ?PLAN: ?PT FREQUENCY: 2x/week ?  ?PT DURATION: 8 weeks ?  ?PLANNED INTERVENTIONS: Therapeutic exercises, Therapeutic activity, Neuromuscular re-education, Balance training, Gait training, Patient/Family  education, Joint mobilization, Stair training, DME instructions, Cryotherapy, Moist heat, Taping, Vasopneumatic device, and Manual therapy ?  ?PLAN FOR NEXT SESSION: review HEP, try Nustep , vaso, gait training  ?  ?Je

## 2022-01-11 ENCOUNTER — Encounter: Payer: Self-pay | Admitting: Physical Therapy

## 2022-01-11 ENCOUNTER — Ambulatory Visit: Payer: Medicare Other | Attending: Physician Assistant | Admitting: Physical Therapy

## 2022-01-11 DIAGNOSIS — R600 Localized edema: Secondary | ICD-10-CM | POA: Diagnosis not present

## 2022-01-11 DIAGNOSIS — R262 Difficulty in walking, not elsewhere classified: Secondary | ICD-10-CM | POA: Diagnosis not present

## 2022-01-11 DIAGNOSIS — Z981 Arthrodesis status: Secondary | ICD-10-CM | POA: Diagnosis not present

## 2022-01-11 DIAGNOSIS — M25672 Stiffness of left ankle, not elsewhere classified: Secondary | ICD-10-CM | POA: Diagnosis not present

## 2022-01-17 NOTE — Therapy (Signed)
OUTPATIENT PHYSICAL THERAPY TREATMENT NOTE   Patient Name: Michelle Bell MRN: [id number] DOB:[birth year], 46 y.o., female Today's Date: 01/18/2022   PCP/REFERRING PROVIDER: Raelyn Number, PA  END OF SESSION:   PT End of Session - 01/18/22 1206     Visit Number 3    Number of Visits 16    Date for PT Re-Evaluation 02/22/22    PT Start Time 4627    PT Stop Time 1232    PT Time Calculation (min) 43 min    Activity Tolerance Patient tolerated treatment well    Behavior During Therapy WFL for tasks assessed/performed              Past Medical History:  Diagnosis Date   Anxiety    Depression    Hyperlipidemia, mixed    Hypertension    Iron 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)    previously had Iron infusion's by dr Lindi Adie   Menorrhagia with regular cycle    OA (osteoarthritis)    Past Surgical History:  Procedure Laterality Date   BREAST REDUCTION SURGERY Bilateral 01/31/2021   Procedure: MAMMARY REDUCTION  (BREAST);  Surgeon: Wallace Going, DO;  Location: [place];  Service: Plastics;  Laterality: Bilateral;   CESAREAN SECTION     FUSION OF TALONAVICULAR JOINT Left 10/29/2021   Procedure: FUSION OF TALONAVICULAR JOINT;  Surgeon: Felipa Furnace, DPM;  Location: [place];  Service: Podiatry;  Laterality: Left;  GENERAL WITH BLOCK   MASS EXCISION Left 12/23/2016   Procedure: EXCISION LEFT GROIN MASS;  Surgeon: Coralie Keens, MD;  Location: [facility];  Service: General;  Laterality: Left;   SUBTALAR JOINT ARTHROEREISIS Left 10/29/2021   Procedure: SUBTALAR JOINT ARTHRODESIS;  Surgeon: Felipa Furnace, DPM;  Location: [place];  Service: Podiatry;  Laterality: Left;   Patient Active Problem List   Diagnosis Date Noted   Back pain 06/09/2020   Neck pain 06/09/2020   Symptomatic mammary hypertrophy 06/09/2020   Iron deficiency anemia 10/14/2018    REFERRING DIAG: ankle fusion  THERAPY DIAG:   Status post ankle fusion  Localized edema  Difficulty in walking, not elsewhere classified  Stiffness of left ankle, not elsewhere classified  PERTINENT HISTORY: see snapshot   PRECAUTIONS: CAM boot WBAT   SUBJECTIVE: It swells when I am up on it too much.  She reports being up and down during the day, about 6 pm she needs to sit.    PAIN:  Are you having pain? Yes: NPRS scale: 0/10 Pain location: L ankle  Pain description: tight  Aggravating factors: standing too long  Relieving factors: rest,elevation   OBJECTIVE: (objective measures completed at initial evaluation unless otherwise dated)  DIAGNOSTIC FINDINGS: Radiographs: 3 views of skeletally mature adult left foot: Hardware is intact no signs of loosening or backing out noted.  Good correction alignment noted.   There are some consolidation noted.    PATIENT SURVEYS:  FOTO 29%   COGNITION:           Overall cognitive status: Within functional limits for tasks assessed                          SENSATION: Light touch: Impaired numb medial ankle    POSTURE:  Standings with LLE abducted and ER to accommodate boot   PALPATION: Pain with palpation to L dorsum of foot and medially along scar, wrapping posteriorly to heel  LE ROM:   Active /PROM Right 12/28/2021 Left 12/28/2021  Hip flexion      Hip extension      Hip abduction      Hip adduction      Hip internal rotation      Hip external rotation      Knee flexion      Knee extension      Ankle dorsiflexion 10 +6  Ankle plantarflexion   16  Ankle inversion 42 0/0  Ankle eversion 10 8/12   (Blank rows = not tested)   LE MMT:   MMT Right 12/28/2021 Left 12/28/2021  Hip flexion 4 4  Hip extension      Hip abduction      Hip adduction      Hip internal rotation      Hip external rotation      Knee flexion 5 5  Knee extension 5 5  Ankle dorsiflexion 5 5  Ankle plantarflexion 4 4-  Ankle inversion 5 2-  Ankle eversion 5 2-   (Blank rows = not  tested)     FUNCTIONAL TESTS:  NT   GAIT: Distance walked: 150 Assistive device utilized: Single point cane Level of assistance: Modified independence Comments: LLE abducted, external rotation of hip        TODAY'S TREATMENT: PT/POC, HEP   [facility] Adult PT Treatment:                                                DATE: 01/18/22 Therapeutic Exercise: Nustep L 4 UE and LE for 6 min  Ankle inversion, eversion with towel, 1 min each direction Towel scrunches  Sit to stand x 15 with chair support from UEs  Standing weightshifting R/L with UE support  Standing toes lifts, heel raises  Hip abduction x 15 LLE  High march LLE x 15  Supine bridge legs on bolster Hip IR stretch (windshield wiper) +HEP Ankle AROM, circles supine  Ankle inversion, eversion Red band supine x 10-15 +HEP   Manual Therapy: Manual PROM all planes  , retromassage and scar tissue Self Care: Footwear next visit , unable to wear shoe   [facility] Adult PT Treatment:                                                DATE: 01/11/22 Therapeutic Exercise: Seated AROM all planes, circles Calf stretch with towel  Seated heel raises x 20  Seated toe raises x 20  Towel scrunches 1 min.  Towel inversion/eversion very limited inversion about 1 min each direction Supine ankle theraband red with PT assist: DF/PF, INV and EV x 15 each   Manual Therapy: Soft tissue ,PROM to L ankle , edema mgmt  Modalities: Vaso 15 min with elevation, coldest setting, mod compression   PATIENT EDUCATION:  Education details: HEP, POC  Person educated: Patient Education method: Consulting civil engineer, Demonstration, and Handouts Education comprehension: verbalized understanding, returned demonstration, verbal cues required, tactile cues required, and needs further education     HOME EXERCISE PROGRAM: Access Code: [email or web address] URL: [email or web address] Date: 12/28/2021 Prepared by: Raeford Razor   Exercises - Seated Toe Towel Scrunches   - 2-3 x daily - 7 x weekly - 2 sets -  10 reps - 5 hold - Seated Heel Raise  - 2-3 x daily - 7 x weekly - 2 sets - 10 reps - 5 hold - Supine Ankle Circles  - 2-3 x daily - 7 x weekly - 1 sets - 5 reps - 30 hold - Supine Single Leg Ankle Pumps  - 2-3 x daily - 7 x weekly - 2 sets - 10 reps - 5 hold - Seated Gastroc Stretch with Strap  - 2-3 x daily - 7 x weekly - 1 sets - 5 reps - 30 hold   ASSESSMENT:   CLINICAL IMPRESSION:  Patient able to demo improved ankle mobility, noting small improvement in inversion and eversion.  Worked on weight shifting in prep for gait training but the shoe she brought was not big enough for her foot.  Cont to progress ankle ROM and strength, LLE tightness hip.    OBJECTIVE IMPAIRMENTS Abnormal gait, decreased activity tolerance, decreased balance, decreased mobility, difficulty walking, decreased ROM, decreased strength, hypomobility, increased edema, increased fascial restrictions, impaired flexibility, postural dysfunction, obesity, and pain.    ACTIVITY LIMITATIONS cleaning, community activity, meal prep, occupation, laundry, and shopping.    PERSONAL FACTORS Time since onset of injury/illness/exacerbation and 1-2 comorbidities: OA, depression  are also affecting patient's functional outcome.      REHAB POTENTIAL: Excellent   CLINICAL DECISION MAKING: Stable/uncomplicated   EVALUATION COMPLEXITY: Low     GOALS: Goals reviewed with patient? No   SHORT TERM GOALS: Target date: 01/25/2022   Pt will be able to show independence with HEP for LE strength and ROM  Baseline: Goal status: MET   2.  Pt will be able to demo increased ability to weight bear through LLE with transfers and gait  Baseline:  Goal status: partial met   3.  Pt will verbalize measures to reduce swelling and pain at home  Baseline:  Goal status: MET     LONG TERM GOALS: Target date: 02/22/2022   Pt will be able to walk without the cane and min noticeable limp for 500 feet in the  clinic Baseline:  Goal status: INITIAL   2.  Pt will be able to improve FOTO score to > 60% to demo improved functional mobility.  Baseline:  Goal status: INITIAL   3.  Pt will be able to negotiate stairs (14 +) with 1 rail reciprocally and min pain in LLE  Baseline:  Goal status: INITIAL   4.  Pt will be able to perform static balance exercises (SLS, tandem) for 30 sec at a time with light UE support Baseline:  Goal status: INITIAL   5.  Further goals TBA  Baseline:  Goal status: INITIAL     PLAN: PT FREQUENCY: 2x/week   PT DURATION: 8 weeks   PLANNED INTERVENTIONS: Therapeutic exercises, Therapeutic activity, Neuromuscular re-education, Balance training, Gait training, Patient/Family education, Joint mobilization, Stair training, DME instructions, Cryotherapy, Moist heat, Taping, Vasopneumatic device, and Manual therapy   PLAN FOR NEXT SESSION: review HEP, try Nustep , vaso, gait training manual standing as tol.     Denyce Harr, PT 01/18/2022, 12:40 PM    Raeford Razor, PT 01/18/22 12:40 PM Phone: [phone] Fax: [phone]

## 2022-01-18 ENCOUNTER — Encounter: Payer: Self-pay | Admitting: Physical Therapy

## 2022-01-18 ENCOUNTER — Ambulatory Visit: Payer: Medicare Other | Admitting: Physical Therapy

## 2022-01-18 DIAGNOSIS — Z981 Arthrodesis status: Secondary | ICD-10-CM | POA: Diagnosis not present

## 2022-01-18 DIAGNOSIS — R262 Difficulty in walking, not elsewhere classified: Secondary | ICD-10-CM | POA: Diagnosis not present

## 2022-01-18 DIAGNOSIS — M25672 Stiffness of left ankle, not elsewhere classified: Secondary | ICD-10-CM

## 2022-01-18 DIAGNOSIS — R600 Localized edema: Secondary | ICD-10-CM | POA: Diagnosis not present

## 2022-01-24 DIAGNOSIS — F411 Generalized anxiety disorder: Secondary | ICD-10-CM | POA: Diagnosis not present

## 2022-01-24 DIAGNOSIS — F330 Major depressive disorder, recurrent, mild: Secondary | ICD-10-CM | POA: Diagnosis not present

## 2022-02-01 ENCOUNTER — Ambulatory Visit (INDEPENDENT_AMBULATORY_CARE_PROVIDER_SITE_OTHER): Payer: Medicare Other

## 2022-02-01 ENCOUNTER — Ambulatory Visit (INDEPENDENT_AMBULATORY_CARE_PROVIDER_SITE_OTHER): Payer: Medicare Other | Admitting: Podiatry

## 2022-02-01 DIAGNOSIS — M19079 Primary osteoarthritis, unspecified ankle and foot: Secondary | ICD-10-CM | POA: Diagnosis not present

## 2022-02-01 DIAGNOSIS — M19072 Primary osteoarthritis, left ankle and foot: Secondary | ICD-10-CM

## 2022-02-04 DIAGNOSIS — Z87891 Personal history of nicotine dependence: Secondary | ICD-10-CM | POA: Diagnosis not present

## 2022-02-04 DIAGNOSIS — I10 Essential (primary) hypertension: Secondary | ICD-10-CM | POA: Diagnosis not present

## 2022-02-04 DIAGNOSIS — N920 Excessive and frequent menstruation with regular cycle: Secondary | ICD-10-CM | POA: Diagnosis not present

## 2022-02-04 DIAGNOSIS — R202 Paresthesia of skin: Secondary | ICD-10-CM | POA: Diagnosis not present

## 2022-02-04 DIAGNOSIS — D5 Iron 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): Secondary | ICD-10-CM | POA: Diagnosis not present

## 2022-02-04 DIAGNOSIS — E782 Mixed hyperlipidemia: Secondary | ICD-10-CM | POA: Diagnosis not present

## 2022-02-04 DIAGNOSIS — Z0000 Encounter for general adult medical examination without abnormal findings: Secondary | ICD-10-CM | POA: Diagnosis not present

## 2022-02-06 ENCOUNTER — Ambulatory Visit: Payer: Medicare Other | Attending: Podiatry | Admitting: Physical Therapy

## 2022-02-06 ENCOUNTER — Encounter: Payer: Self-pay | Admitting: Physical Therapy

## 2022-02-06 DIAGNOSIS — M25672 Stiffness of left ankle, not elsewhere classified: Secondary | ICD-10-CM | POA: Diagnosis not present

## 2022-02-06 DIAGNOSIS — Z981 Arthrodesis status: Secondary | ICD-10-CM | POA: Diagnosis not present

## 2022-02-06 DIAGNOSIS — R262 Difficulty in walking, not elsewhere classified: Secondary | ICD-10-CM | POA: Diagnosis not present

## 2022-02-06 DIAGNOSIS — G8929 Other chronic pain: Secondary | ICD-10-CM | POA: Diagnosis not present

## 2022-02-06 DIAGNOSIS — R600 Localized edema: Secondary | ICD-10-CM | POA: Diagnosis not present

## 2022-02-06 NOTE — Progress Notes (Signed)
Subjective:  Patient ID: Michelle Bell, female    DOB: 08/07/1976,  MRN: [id number]  No chief complaint on file.   DOS: 10/29/2021 Procedure: Left talonavicular joint fusion with subtalar joint fusion and gastrocnemius recession  46 y.o. female returns for post-op check.  Patient states she is doing okay minimal pain.  She states she is doing okay.  She is able to do better with the boot ambulating.  No pain.  Overall getting better Review of Systems: Negative except as noted in the HPI. Denies N/V/F/Ch.  Past Medical History:  Diagnosis Date   Anxiety    Depression    Hyperlipidemia, mixed    Hypertension    Iron 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)    previously had Iron infusion's by dr Lindi Adie   Menorrhagia with regular cycle    OA (osteoarthritis)     Current Outpatient Medications:    acetaminophen (TYLENOL) 325 MG tablet, Take 325 mg by mouth daily as needed for moderate pain. (Patient not taking: Reported on 12/28/2021), Disp: , Rfl:    amLODipine (NORVASC) 5 MG tablet, Take 5 mg by mouth daily., Disp: , Rfl:    aspirin EC 81 MG tablet, Take 81 mg by mouth daily. (Patient not taking: Reported on 12/28/2021), Disp: , Rfl:    diclofenac Sodium (VOLTAREN) 1 % GEL, Apply topically 4 (four) times daily. (Patient not taking: Reported on 12/28/2021), Disp: , Rfl:    ferrous sulfate 325 (65 FE) MG tablet, Take 325 mg by mouth daily with breakfast. (Patient not taking: Reported on 12/28/2021), Disp: , Rfl:    gabapentin (NEURONTIN) 300 MG capsule, Take 300 mg by mouth at bedtime. (Patient not taking: Reported on 12/28/2021), Disp: , Rfl:    hydrochlorothiazide (HYDRODIURIL) 25 MG tablet, Take 25 mg by mouth daily. (Patient not taking: Reported on 10/29/2021), Disp: , Rfl:    ibuprofen (ADVIL) 800 MG tablet, Take 1 tablet (800 mg total) by mouth every 6 (six) hours as needed., Disp: 60 tablet, Rfl: 1   ibuprofen (ADVIL,MOTRIN) 200 MG tablet, Take 200 mg by mouth daily as  needed for headache or moderate pain. (Patient not taking: Reported on 12/28/2021), Disp: , Rfl:    meloxicam (MOBIC) 7.5 MG tablet, Take 7.5 mg by mouth daily. (Patient not taking: Reported on 10/29/2021), Disp: , Rfl:    Multiple Vitamin (MULTIVITAMIN WITH MINERALS) TABS tablet, Take 1 tablet by mouth daily. (Patient not taking: Reported on 12/28/2021), Disp: , Rfl:    nitroGLYCERIN (NITROGLYN) 2 % ointment, Apply 0.5 inches topically every 6 (six) hours. (Patient not taking: Reported on 02/27/2021), Disp: 30 g, Rfl: 0   ondansetron (ZOFRAN) 4 MG tablet, Take 1 tablet (4 mg total) by mouth every 8 (eight) hours as needed for nausea or vomiting. (Patient not taking: Reported on 12/28/2021), Disp: 20 tablet, Rfl: 0   oxyCODONE-acetaminophen (PERCOCET) 5-325 MG tablet, Take 1 tablet by mouth every 4 (four) hours as needed for severe pain. (Patient not taking: Reported on 12/28/2021), Disp: 30 tablet, Rfl: 0   oxyCODONE-acetaminophen (PERCOCET) 5-325 MG tablet, Take 1 tablet by mouth every 4 (four) hours as needed for severe pain. (Patient not taking: Reported on 12/28/2021), Disp: 30 tablet, Rfl: 0  Social History   Tobacco Use  Smoking Status Never  Smokeless Tobacco Never    No Known Allergies Objective:  There were no vitals filed for this visit. There is no height or weight on file to calculate BMI. Constitutional Well developed. Well nourished.  Vascular Foot warm and well perfused. Capillary refill normal to all digits.   Neurologic Normal speech. Oriented to person, place, and time. Epicritic sensation to light touch grossly present bilaterally.  Dermatologic Skin completely epithelialized.  Good correction alignment noted.  Good reduction of deformity noted as well as her pain  Orthopedic: No tenderness to palpation noted about the surgical site.   Radiographs: 3 views of skeletally mature adult left foot: Hardware is intact no signs of loosening or backing out noted.  Good correction  alignment noted.  Intact hardware.  Some consolidation noted across the fusion site. Assessment:   1. Arthritis of left subtalar joint   2. Osteoarthritis of talonavicular joint     Plan:  Patient was evaluated and treated and all questions answered.  S/p foot surgery left -Progressing as expected post-operatively. -XR: See above -WB Status: She can now begin transitioning to weightbearing as tolerated in cam boot as she has already been doing that -Sutures: Removed no clinical signs of Deis is noted no complication noted. -Medications: None -Continue physical therapy. -Clinically her pain is improved considerably.  She is doing well with transition into a cam boot.  No follow-ups on file.

## 2022-02-06 NOTE — Therapy (Signed)
OUTPATIENT PHYSICAL THERAPY TREATMENT NOTE   Patient Name: Michelle Bell MRN: [id number] DOB:[birth year], 46 y.o., female Today's Date: 02/06/2022   PCP/REFERRING PROVIDER: Raelyn Number, PA  END OF SESSION:   PT End of Session - 02/06/22 1024     Visit Number 4    Number of Visits 16    Date for PT Re-Evaluation 02/22/22    PT Start Time 1020    PT Stop Time 1100    PT Time Calculation (min) 40 min              Past Medical History:  Diagnosis Date   Anxiety    Depression    Hyperlipidemia, mixed    Hypertension    Iron 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)    previously had Iron infusion's by dr Lindi Adie   Menorrhagia with regular cycle    OA (osteoarthritis)    Past Surgical History:  Procedure Laterality Date   BREAST REDUCTION SURGERY Bilateral 01/31/2021   Procedure: MAMMARY REDUCTION  (BREAST);  Surgeon: Wallace Going, DO;  Location: [place];  Service: Plastics;  Laterality: Bilateral;   CESAREAN SECTION     FUSION OF TALONAVICULAR JOINT Left 10/29/2021   Procedure: FUSION OF TALONAVICULAR JOINT;  Surgeon: Felipa Furnace, DPM;  Location: [place];  Service: Podiatry;  Laterality: Left;  GENERAL WITH BLOCK   MASS EXCISION Left 12/23/2016   Procedure: EXCISION LEFT GROIN MASS;  Surgeon: Coralie Keens, MD;  Location: [facility];  Service: General;  Laterality: Left;   SUBTALAR JOINT ARTHROEREISIS Left 10/29/2021   Procedure: SUBTALAR JOINT ARTHRODESIS;  Surgeon: Felipa Furnace, DPM;  Location: [place];  Service: Podiatry;  Laterality: Left;   Patient Active Problem List   Diagnosis Date Noted   Back pain 06/09/2020   Neck pain 06/09/2020   Symptomatic mammary hypertrophy 06/09/2020   Iron deficiency anemia 10/14/2018    REFERRING DIAG: ankle fusion  THERAPY DIAG:  Status post ankle fusion  Localized edema  PERTINENT HISTORY: see snapshot   PRECAUTIONS: CAM boot WBAT    SUBJECTIVE: I saw MD and he told me to get wide New Balance sneakers. The pain is not as bad when I walk at home without the boot. The foot feels heavy. No pain right now.   PAIN:  Are you having pain? Yes: NPRS scale: 0/10 Pain location: L ankle  Pain description: tight  Aggravating factors: standing too long  Relieving factors: rest,elevation   OBJECTIVE: (objective measures completed at initial evaluation unless otherwise dated)  DIAGNOSTIC FINDINGS: Radiographs: 3 views of skeletally mature adult left foot: Hardware is intact no signs of loosening or backing out noted.  Good correction alignment noted.   There are some consolidation noted.    PATIENT SURVEYS:  FOTO 29%   COGNITION:           Overall cognitive status: Within functional limits for tasks assessed                          SENSATION: Light touch: Impaired numb medial ankle    POSTURE:  Standings with LLE abducted and ER to accommodate boot   PALPATION: Pain with palpation to L dorsum of foot and medially along scar, wrapping posteriorly to heel    LE ROM:   Active /PROM Right 12/28/2021 Left 12/28/2021 Left 02/06/22  Hip flexion       Hip extension  Hip abduction       Hip adduction       Hip internal rotation       Hip external rotation       Knee flexion       Knee extension       Ankle dorsiflexion 10 +6 -2 AROM/+2 PROM  Ankle plantarflexion   16 30 AROM / 35 PROM   Ankle inversion 42 0/0 0 AROM/ +2 PROM  Ankle eversion 10 8/12    (Blank rows = not tested)   LE MMT:   MMT Right 12/28/2021 Left 12/28/2021  Hip flexion 4 4  Hip extension      Hip abduction      Hip adduction      Hip internal rotation      Hip external rotation      Knee flexion 5 5  Knee extension 5 5  Ankle dorsiflexion 5 5  Ankle plantarflexion 4 4-  Ankle inversion 5 2-  Ankle eversion 5 2-   (Blank rows = not tested)     FUNCTIONAL TESTS:  NT   GAIT: Distance walked: 150 Assistive device utilized:  Single point cane Level of assistance: Modified independence Comments: LLE abducted, external rotation of hip       [facility] Adult PT Treatment:                                                DATE: 02/06/22 Therapeutic Exercise: SLR 10 x 2  Side hip abduction 10 x 2  Ankle circles, ankle pumps Seated towel slides inversion eversion Seated heel and toe raises  Manual Therapy: Passive ROM 4 way ankle left  Gait: Gait with clinic SPC (pt's cane is too high and not adjustable)  Cues for step length , heel strike and toe off     [facility] Adult PT Treatment:                                                DATE: 01/18/22 Therapeutic Exercise: Nustep L 4 UE and LE for 6 min  Ankle inversion, eversion with towel, 1 min each direction Towel scrunches  Sit to stand x 15 with chair support from UEs  Standing weightshifting R/L with UE support  Standing toes lifts, heel raises  Hip abduction x 15 LLE  High march LLE x 15  Supine bridge legs on bolster Hip IR stretch (windshield wiper) +HEP Ankle AROM, circles supine  Ankle inversion, eversion Red band supine x 10-15 +HEP   Manual Therapy: Manual PROM all planes  , retromassage and scar tissue Self Care: Footwear next visit , unable to wear shoe   [facility] Adult PT Treatment:                                                DATE: 01/11/22 Therapeutic Exercise: Seated AROM all planes, circles Calf stretch with towel  Seated heel raises x 20  Seated toe raises x 20  Towel scrunches 1 min.  Towel inversion/eversion very limited inversion about 1 min each direction Supine ankle theraband red with  PT assist: DF/PF, INV and EV x 15 each   Manual Therapy: Soft tissue ,PROM to L ankle , edema mgmt  Modalities: Vaso 15 min with elevation, coldest setting, mod compression   TODAY'S TREATMENT:( INITIAL EVALUATION) PT/POC, HEP   PATIENT EDUCATION:  Education details: HEP, POC  Person educated: Patient Education method: Consulting civil engineer, Demonstration,  and Handouts Education comprehension: verbalized understanding, returned demonstration, verbal cues required, tactile cues required, and needs further education     HOME EXERCISE PROGRAM: Access Code: [email or web address] URL: [email or web address] Date: 12/28/2021 Prepared by: Raeford Razor   Exercises - Seated Toe Towel Scrunches  - 2-3 x daily - 7 x weekly - 2 sets - 10 reps - 5 hold - Seated Heel Raise  - 2-3 x daily - 7 x weekly - 2 sets - 10 reps - 5 hold - Supine Ankle Circles  - 2-3 x daily - 7 x weekly - 1 sets - 5 reps - 30 hold - Supine Single Leg Ankle Pumps  - 2-3 x daily - 7 x weekly - 2 sets - 10 reps - 5 hold - Seated Gastroc Stretch with Strap  - 2-3 x daily - 7 x weekly - 1 sets - 5 reps - 30 hold   ASSESSMENT:   CLINICAL IMPRESSION:  Patient able to demo improved ankle mobility, noting small improvement in inversion and eversion.  Worked on weight shifting in prep for gait training but the shoe she brought was not big enough for her foot.  Cont to progress ankle ROM and strength, LLE tightness hip.    OBJECTIVE IMPAIRMENTS Abnormal gait, decreased activity tolerance, decreased balance, decreased mobility, difficulty walking, decreased ROM, decreased strength, hypomobility, increased edema, increased fascial restrictions, impaired flexibility, postural dysfunction, obesity, and pain.    ACTIVITY LIMITATIONS cleaning, community activity, meal prep, occupation, laundry, and shopping.    PERSONAL FACTORS Time since onset of injury/illness/exacerbation and 1-2 comorbidities: OA, depression  are also affecting patient's functional outcome.      REHAB POTENTIAL: Excellent   CLINICAL DECISION MAKING: Stable/uncomplicated   EVALUATION COMPLEXITY: Low     GOALS: Goals reviewed with patient? No   SHORT TERM GOALS: Target date: 01/25/2022   Pt will be able to show independence with HEP for LE strength and ROM  Baseline: Goal status: MET   2.  Pt will be able to  demo increased ability to weight bear through LLE with transfers and gait  Baseline:  Goal status: partial met   3.  Pt will verbalize measures to reduce swelling and pain at home  Baseline:  Goal status: MET     LONG TERM GOALS: Target date: 02/22/2022   Pt will be able to walk without the cane and min noticeable limp for 500 feet in the clinic Baseline:  Goal status: INITIAL   2.  Pt will be able to improve FOTO score to > 60% to demo improved functional mobility.  Baseline:  Goal status: INITIAL   3.  Pt will be able to negotiate stairs (14 +) with 1 rail reciprocally and min pain in LLE  Baseline:  Goal status: INITIAL   4.  Pt will be able to perform static balance exercises (SLS, tandem) for 30 sec at a time with light UE support Baseline:  Goal status: INITIAL   5.  Further goals TBA  Baseline:  Goal status: INITIAL     PLAN: PT FREQUENCY: 2x/week   PT DURATION: 8 weeks   PLANNED INTERVENTIONS: Therapeutic exercises, Therapeutic activity,  Neuromuscular re-education, Balance training, Gait training, Patient/Family education, Joint mobilization, Stair training, DME instructions, Cryotherapy, Moist heat, Taping, Vasopneumatic device, and Manual therapy   PLAN FOR NEXT SESSION: review HEP, try Nustep , vaso, gait training manual standing as tol.     Hessie Diener, PTA 02/06/22 2:25 PM Phone: [phone] Fax: [phone]

## 2022-02-07 NOTE — Therapy (Signed)
OUTPATIENT PHYSICAL THERAPY TREATMENT NOTE   Patient Name: Michelle Bell MRN: [id number] DOB:March 27, 1976, 46 y.o., female Today's Date: 02/08/2022   PCP/REFERRING PROVIDER: Raelyn Number, PA  END OF SESSION:   PT End of Session - 02/08/22 1156     Visit Number 5    Number of Visits 16    Date for PT Re-Evaluation 02/22/22    Authorization Type MCR    PT Start Time 1150    PT Stop Time 1231    PT Time Calculation (min) 41 min    Activity Tolerance Patient tolerated treatment well    Behavior During Therapy WFL for tasks assessed/performed               Past Medical History:  Diagnosis Date   Anxiety    Depression    Hyperlipidemia, mixed    Hypertension    Iron 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)    previously had Iron infusion's by dr Lindi Adie   Menorrhagia with regular cycle    OA (osteoarthritis)    Past Surgical History:  Procedure Laterality Date   BREAST REDUCTION SURGERY Bilateral 01/31/2021   Procedure: MAMMARY REDUCTION  (BREAST);  Surgeon: Wallace Going, DO;  Location: [place];  Service: Plastics;  Laterality: Bilateral;   CESAREAN SECTION     FUSION OF TALONAVICULAR JOINT Left 10/29/2021   Procedure: FUSION OF TALONAVICULAR JOINT;  Surgeon: Felipa Furnace, DPM;  Location: [facility];  Service: Podiatry;  Laterality: Left;  GENERAL WITH BLOCK   MASS EXCISION Left 12/23/2016   Procedure: EXCISION LEFT GROIN MASS;  Surgeon: Coralie Keens, MD;  Location: [facility];  Service: General;  Laterality: Left;   SUBTALAR JOINT ARTHROEREISIS Left 10/29/2021   Procedure: SUBTALAR JOINT ARTHRODESIS;  Surgeon: Felipa Furnace, DPM;  Location: [place];  Service: Podiatry;  Laterality: Left;   Patient Active Problem List   Diagnosis Date Noted   Back pain 06/09/2020   Neck pain 06/09/2020   Symptomatic mammary hypertrophy 06/09/2020   Iron deficiency anemia 10/14/2018    REFERRING DIAG: ankle  fusion  THERAPY DIAG:  Status post ankle fusion  Localized edema  Difficulty in walking, not elsewhere classified  Stiffness of left ankle, not elsewhere classified  Other chronic pain  PERTINENT HISTORY: see snapshot   PRECAUTIONS: CAM boot WBAT   SUBJECTIVE: No pain .  Increased swelling today. Sometimes the heel hurts when I get ready to stand up.   PAIN:  Are you having pain? Yes: NPRS scale: 0/10 Pain location: L ankle  Pain description: tight  Aggravating factors: standing too long  Relieving factors: rest,elevation   OBJECTIVE: (objective measures completed at initial evaluation unless otherwise dated)  DIAGNOSTIC FINDINGS: Radiographs: 3 views of skeletally mature adult left foot: Hardware is intact no signs of loosening or backing out noted.  Good correction alignment noted.   There are some consolidation noted.    PATIENT SURVEYS:  FOTO 29%   COGNITION:           Overall cognitive status: Within functional limits for tasks assessed                          SENSATION: Light touch: Impaired numb medial ankle    POSTURE:  Standings with LLE abducted and ER to accommodate boot   PALPATION: Pain with palpation to L dorsum of foot and medially along scar, wrapping posteriorly to heel  LE ROM:   Active /PROM Right 12/28/2021 Left 12/28/2021 Left 02/06/22  Hip flexion       Hip extension       Hip abduction       Hip adduction       Hip internal rotation       Hip external rotation       Knee flexion       Knee extension       Ankle dorsiflexion 10 +6 -2 AROM/+2 PROM  Ankle plantarflexion   16 30 AROM / 35 PROM   Ankle inversion 42 0/0 0 AROM/ +2 PROM  Ankle eversion 10 8/12    (Blank rows = not tested)   LE MMT:   MMT Right 12/28/2021 Left 12/28/2021  Hip flexion 4 4  Hip extension      Hip abduction      Hip adduction      Hip internal rotation      Hip external rotation      Knee flexion 5 5  Knee extension 5 5  Ankle dorsiflexion 5 5   Ankle plantarflexion 4 4-  Ankle inversion 5 2-  Ankle eversion 5 2-   (Blank rows = not tested)     FUNCTIONAL TESTS:  NT   GAIT: Distance walked: 150 Assistive device utilized: Single point cane Level of assistance: Modified independence Comments: LLE abducted, external rotation of hip     [facility] Adult PT Treatment:                                                DATE: 02/08/22 Therapeutic Exercise: Supine ankle AROM, knee extension/flexion AROM for mobility, warm up Supine hamstring calf with sheet x 30 sec  Inversion, eversion with band for tactile cue, very challenging Manual Therapy: Retromassage, tape (1 fan for) edema) Edema: mid foot L. 9 1/4 inch Rt 8 3/4 inch   Figure 8 Lt 22 inch Rt  20 inch  Modalities: Vaso 15 min with elevation, coldest setting, mod compression  Self Care: Edema mgmt        [facility] Adult PT Treatment:                                                DATE: 02/06/22 Therapeutic Exercise: SLR 10 x 2  Side hip abduction 10 x 2  Ankle circles, ankle pumps Seated towel slides inversion eversion Seated heel and toe raises  Manual Therapy: Passive ROM 4 way ankle left  Gait: Gait with clinic SPC (pt's cane is too high and not adjustable)  Cues for step length , heel strike and toe off     [facility] Adult PT Treatment:                                                DATE: 01/18/22 Therapeutic Exercise: Nustep L 4 UE and LE for 6 min  Ankle inversion, eversion with towel, 1 min each direction Towel scrunches  Sit to stand x 15 with chair support from UEs  Standing weightshifting R/L with UE support  Standing toes lifts,  heel raises  Hip abduction x 15 LLE  High march LLE x 15  Supine bridge legs on bolster Hip IR stretch (windshield wiper) +HEP Ankle AROM, circles supine  Ankle inversion, eversion Red band supine x 10-15 +HEP   Manual Therapy: Manual PROM all planes  , retromassage and scar tissue Self Care: Footwear next visit , unable to wear  shoe   [facility] Adult PT Treatment:                                                DATE: 01/11/22 Therapeutic Exercise: Seated AROM all planes, circles Calf stretch with towel  Seated heel raises x 20  Seated toe raises x 20  Towel scrunches 1 min.  Towel inversion/eversion very limited inversion about 1 min each direction Supine ankle theraband red with PT assist: DF/PF, INV and EV x 15 each   Manual Therapy: Soft tissue ,PROM to L ankle , edema mgmt  Modalities: Vaso 15 min with elevation, coldest setting, mod compression   TODAY'S TREATMENT:( INITIAL EVALUATION) PT/POC, HEP   PATIENT EDUCATION:  Education details: HEP, POC  Person educated: Patient Education method: Consulting civil engineer, Demonstration, and Handouts Education comprehension: verbalized understanding, returned demonstration, verbal cues required, tactile cues required, and needs further education     HOME EXERCISE PROGRAM: Access Code: [email or web address] URL: [email or web address] Date: 12/28/2021 Prepared by: Raeford Razor   Exercises - Seated Toe Towel Scrunches  - 2-3 x daily - 7 x weekly - 2 sets - 10 reps - 5 hold - Seated Heel Raise  - 2-3 x daily - 7 x weekly - 2 sets - 10 reps - 5 hold - Supine Ankle Circles  - 2-3 x daily - 7 x weekly - 1 sets - 5 reps - 30 hold - Supine Single Leg Ankle Pumps  - 2-3 x daily - 7 x weekly - 2 sets - 10 reps - 5 hold - Seated Gastroc Stretch with Strap  - 2-3 x daily - 7 x weekly - 1 sets - 5 reps - 30 hold   ASSESSMENT:   CLINICAL IMPRESSION:  patient did not bring shoe. She had significant edema in L ankle today, which improved by 1/2 inch post session.  She was more active yesterday, did a lot of walking and stairs in hr apt. Building. Encouraged propping foot when sitting.    OBJECTIVE IMPAIRMENTS Abnormal gait, decreased activity tolerance, decreased balance, decreased mobility, difficulty walking, decreased ROM, decreased strength, hypomobility, increased edema,  increased fascial restrictions, impaired flexibility, postural dysfunction, obesity, and pain.    ACTIVITY LIMITATIONS cleaning, community activity, meal prep, occupation, laundry, and shopping.    PERSONAL FACTORS Time since onset of injury/illness/exacerbation and 1-2 comorbidities: OA, depression  are also affecting patient's functional outcome.      REHAB POTENTIAL: Excellent   CLINICAL DECISION MAKING: Stable/uncomplicated   EVALUATION COMPLEXITY: Low     GOALS: Goals reviewed with patient? No   SHORT TERM GOALS: Target date: 01/25/2022   Pt will be able to show independence with HEP for LE strength and ROM  Baseline: Goal status: MET   2.  Pt will be able to demo increased ability to weight bear through LLE with transfers and gait  Baseline:  Goal status: partial met   3.  Pt will verbalize measures to reduce swelling and pain at home  Baseline:  Goal status: MET     LONG TERM GOALS: Target date: 02/22/2022   Pt will be able to walk without the cane and min noticeable limp for 500 feet in the clinic Baseline:  Goal status: INITIAL   2.  Pt will be able to improve FOTO score to > 60% to demo improved functional mobility.  Baseline:  Goal status: INITIAL   3.  Pt will be able to negotiate stairs (14 +) with 1 rail reciprocally and min pain in LLE  Baseline:  Goal status: INITIAL   4.  Pt will be able to perform static balance exercises (SLS, tandem) for 30 sec at a time with light UE support Baseline:  Goal status: INITIAL   5.  Further goals TBA  Baseline:  Goal status: INITIAL     PLAN: PT FREQUENCY: 2x/week   PT DURATION: 8 weeks   PLANNED INTERVENTIONS: Therapeutic exercises, Therapeutic activity, Neuromuscular re-education, Balance training, Gait training, Patient/Family education, Joint mobilization, Stair training, DME instructions, Cryotherapy, Moist heat, Taping, Vasopneumatic device, and Manual therapy   PLAN FOR NEXT SESSION: review HEP, try  Nustep , vaso, gait training manual standing as Isabelle Course, PT 02/08/22 12:26 PM Phone: [phone] Fax: [phone]

## 2022-02-08 ENCOUNTER — Encounter: Payer: Self-pay | Admitting: Physical Therapy

## 2022-02-08 ENCOUNTER — Ambulatory Visit: Payer: Medicare Other | Admitting: Physical Therapy

## 2022-02-08 DIAGNOSIS — M25672 Stiffness of left ankle, not elsewhere classified: Secondary | ICD-10-CM | POA: Diagnosis not present

## 2022-02-08 DIAGNOSIS — Z981 Arthrodesis status: Secondary | ICD-10-CM

## 2022-02-08 DIAGNOSIS — G8929 Other chronic pain: Secondary | ICD-10-CM

## 2022-02-08 DIAGNOSIS — R262 Difficulty in walking, not elsewhere classified: Secondary | ICD-10-CM | POA: Diagnosis not present

## 2022-02-08 DIAGNOSIS — R600 Localized edema: Secondary | ICD-10-CM

## 2022-02-12 ENCOUNTER — Ambulatory Visit: Payer: Medicare Other

## 2022-02-14 ENCOUNTER — Ambulatory Visit: Payer: Medicare Other | Admitting: Physical Therapy

## 2022-02-14 DIAGNOSIS — F330 Major depressive disorder, recurrent, mild: Secondary | ICD-10-CM | POA: Diagnosis not present

## 2022-02-14 DIAGNOSIS — F411 Generalized anxiety disorder: Secondary | ICD-10-CM | POA: Diagnosis not present

## 2022-02-18 NOTE — Therapy (Unsigned)
OUTPATIENT PHYSICAL THERAPY TREATMENT NOTE RENEWAL   Patient Name: Michelle Bell MRN: [id number] DOB:1976/04/09, 46 y.o., female Today's Date: 02/19/2022   PCP/REFERRING PROVIDER: Raelyn Number, PA  END OF SESSION:   PT End of Session - 02/19/22 1018     Visit Number 6    Number of Visits 16    Date for PT Re-Evaluation 04/02/22    Authorization Type MCR/MCD    PT Start Time 9485    PT Stop Time 1106    PT Time Calculation (min) 51 min    Activity Tolerance Patient tolerated treatment well    Behavior During Therapy WFL for tasks assessed/performed                Past Medical History:  Diagnosis Date   Anxiety    Depression    Hyperlipidemia, mixed    Hypertension    Iron 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)    previously had Iron infusion's by dr Lindi Adie   Menorrhagia with regular cycle    OA (osteoarthritis)    Past Surgical History:  Procedure Laterality Date   BREAST REDUCTION SURGERY Bilateral 01/31/2021   Procedure: MAMMARY REDUCTION  (BREAST);  Surgeon: Wallace Going, DO;  Location: [facility];  Service: Plastics;  Laterality: Bilateral;   CESAREAN SECTION     FUSION OF TALONAVICULAR JOINT Left 10/29/2021   Procedure: FUSION OF TALONAVICULAR JOINT;  Surgeon: Felipa Furnace, DPM;  Location: [facility];  Service: Podiatry;  Laterality: Left;  GENERAL WITH BLOCK   MASS EXCISION Left 12/23/2016   Procedure: EXCISION LEFT GROIN MASS;  Surgeon: Coralie Keens, MD;  Location: [facility];  Service: General;  Laterality: Left;   SUBTALAR JOINT ARTHROEREISIS Left 10/29/2021   Procedure: SUBTALAR JOINT ARTHRODESIS;  Surgeon: Felipa Furnace, DPM;  Location: [facility];  Service: Podiatry;  Laterality: Left;   Patient Active Problem List   Diagnosis Date Noted   Back pain 06/09/2020   Neck pain 06/09/2020   Symptomatic mammary hypertrophy 06/09/2020   Iron deficiency anemia 10/14/2018     REFERRING DIAG: ankle fusion  THERAPY DIAG:  Status post ankle fusion  Localized edema  Difficulty in walking, not elsewhere classified  Stiffness of left ankle, not elsewhere classified  PERTINENT HISTORY: see snapshot   PRECAUTIONS: CAM boot WBAT   SUBJECTIVE: I brought my shoe. No pain    PAIN:  Are you having pain? Yes: NPRS scale: 0/10 Pain location: L ankle  Pain description: tight  Aggravating factors: standing too long  Relieving factors: rest,elevation   OBJECTIVE: (objective measures completed at initial evaluation unless otherwise dated)  DIAGNOSTIC FINDINGS: Radiographs: 3 views of skeletally mature adult left foot: Hardware is intact no signs of loosening or backing out noted.  Good correction alignment noted.   There are some consolidation noted.    PATIENT SURVEYS:  FOTO 29%   COGNITION:           Overall cognitive status: Within functional limits for tasks assessed                          SENSATION: Light touch: Impaired numb medial ankle    POSTURE:  Standings with LLE abducted and ER to accommodate boot   PALPATION: Pain with palpation to L dorsum of foot and medially along scar, wrapping posteriorly to heel    LE ROM:   Active /PROM Right 12/28/2021 Left 12/28/2021 Left 02/06/22  Hip flexion       Hip extension       Hip abduction       Hip adduction       Hip internal rotation       Hip external rotation       Knee flexion       Knee extension       Ankle dorsiflexion 10 +6 -2 AROM/+2 PROM  Ankle plantarflexion   16 30 AROM / 35 PROM   Ankle inversion 42 0/0 0 AROM/ +2 PROM  Ankle eversion 10 8/12    (Blank rows = not tested)   LE MMT:   MMT Right 12/28/2021 Left 12/28/2021 Lt 02/19/22  Hip flexion 4 4   Hip extension       Hip abduction       Hip adduction       Hip internal rotation       Hip external rotation       Knee flexion _0 Knee extension _1 Ankle dorsiflexion _2 Ankle plantarflexion 4 4- 4-   Ankle inversion 5 2- 3  Ankle eversion 5 2- 3-   (Blank rows = not tested)     FUNCTIONAL TESTS:  Sit to stand x 5 , 15 sec NO UEs   GAIT: Distance walked: 150 Assistive device utilized: Single point cane Level of assistance: Modified independence Comments: LLE abducted, external rotation of hip   [facility] Adult PT Treatment:                                                DATE: 02/18/22 Therapeutic Exercise: NuStep LE L6 for 6 min  Sit to stand  x 5 15 sec Sit to stand x 10 focus on LLE weightbearing  Standing heel raise bilateral Standing hip abduction x 15 each  Supported squat with bilateral UEs on bar  Step ups 4 inch x 20 LLE  Hamstring, calf stretch x 30 sec x 3  Ankle ROM crossed leg  Therapeutic Activity: Gait with and without cane , focus on heel strike and rolling through foot  Parallel bars for assist retrogait and marching  Modalities: Cold pack 8 min L ankle  Self Care: Footwear, wean off boot      [facility] Adult PT Treatment:                                                DATE: 02/08/22 Therapeutic Exercise: Supine ankle AROM, knee extension/flexion AROM for mobility, warm up Supine hamstring calf with sheet x 30 sec  Inversion, eversion with band for tactile cue, very challenging Manual Therapy: Retromassage, tape (1 fan for) edema) Edema: mid foot L. 9 1/4 inch Rt 8 3/4 inch   Figure 8 Lt 22 inch Rt  20 inch  Modalities: Vaso 15 min with elevation, coldest setting, mod compression  Self Care: Edema mgmt         PATIENT EDUCATION:  Education details: HEP, POC  Person educated: Patient Education method: Consulting civil engineer, Demonstration, and Handouts Education comprehension: verbalized understanding, returned demonstration, verbal cues required, tactile cues required, and needs further education     HOME EXERCISE PROGRAM: Access Code: [email or web address]  URL: [email or web address] Date: 12/28/2021 Prepared by: Raeford Razor   Exercises - Seated Toe  Towel Scrunches  - 2-3 x daily - 7 x weekly - 2 sets - 10 reps - 5 hold - Seated Heel Raise  - 2-3 x daily - 7 x weekly - 2 sets - 10 reps - 5 hold - Supine Ankle Circles  - 2-3 x daily - 7 x weekly - 1 sets - 5 reps - 30 hold - Supine Single Leg Ankle Pumps  - 2-3 x daily - 7 x weekly - 2 sets - 10 reps - 5 hold - Seated Gastroc Stretch with Strap  - 2-3 x daily - 7 x weekly - 1 sets - 5 reps - 30 hold   ASSESSMENT:   CLINICAL IMPRESSION:  Patient spent the majority of the session in standing, working on closed chain LE exercises.  She had some discomfort with full WB on LLE for hip abduction.  She is almos 4 mos post op.  Plans to call MD to see if she can begin weaning off the boot.  She does not see him again until August. She can consciously walk with improved LE alignment but her hip is quite rotated still with poor push off.  Renewed for 6 more weeks. FOTO greatly improved.    OBJECTIVE IMPAIRMENTS Abnormal gait, decreased activity tolerance, decreased balance, decreased mobility, difficulty walking, decreased ROM, decreased strength, hypomobility, increased edema, increased fascial restrictions, impaired flexibility, postural dysfunction, obesity, and pain.    ACTIVITY LIMITATIONS cleaning, community activity, meal prep, occupation, laundry, and shopping.    PERSONAL FACTORS Time since onset of injury/illness/exacerbation and 1-2 comorbidities: OA, depression  are also affecting patient's functional outcome.      REHAB POTENTIAL: Excellent   CLINICAL DECISION MAKING: Stable/uncomplicated   EVALUATION COMPLEXITY: Low     GOALS: Goals reviewed with patient? No   SHORT TERM GOALS: Target date: 01/25/2022   Pt will be able to show independence with HEP for LE strength and ROM  Baseline: Goal status: MET   2.  Pt will be able to demo increased ability to weight bear through LLE with transfers and gait  Baseline:  Goal status MET   3.  Pt will verbalize measures to reduce  swelling and pain at home  Baseline:  Goal status: MET     LONG TERM GOALS: Target date: 02/22/2022   Pt will be able to walk without the cane and min noticeable limp for 500 feet in the clinic Baseline:  Goal status: ongoing    2.  Pt will be able to improve FOTO score to > 60% to demo improved functional mobility.  Baseline:  55% as of renewal  Goal status: ongoing    3.  Pt will be able to negotiate stairs (14 +) with 1 rail reciprocally and min pain in LLE  Baseline:  Goal status: ongoing    4.  Pt will be able to perform static balance exercises (SLS, tandem) for 30 sec at a time with light UE support Baseline:  Goal status: ongoing      PLAN: PT FREQUENCY: 2x/week   PT DURATION: 8 weeks   PLANNED INTERVENTIONS: Therapeutic exercises, Therapeutic activity, Neuromuscular re-education, Balance training, Gait training, Patient/Family education, Joint mobilization, Stair training, DME instructions, Cryotherapy, Moist heat, Taping, Vasopneumatic device, and Manual therapy   PLAN FOR NEXT SESSION: review HEP, try Nustep , vaso, gait training manual standing as Isabelle Course, PT 02/19/22 11:07 AM  Phone: [phone] Fax: [phone]

## 2022-02-19 ENCOUNTER — Encounter: Payer: Self-pay | Admitting: Physical Therapy

## 2022-02-19 ENCOUNTER — Ambulatory Visit: Payer: Medicare Other | Admitting: Physical Therapy

## 2022-02-19 DIAGNOSIS — M25672 Stiffness of left ankle, not elsewhere classified: Secondary | ICD-10-CM | POA: Diagnosis not present

## 2022-02-19 DIAGNOSIS — Z981 Arthrodesis status: Secondary | ICD-10-CM

## 2022-02-19 DIAGNOSIS — R600 Localized edema: Secondary | ICD-10-CM | POA: Diagnosis not present

## 2022-02-19 DIAGNOSIS — G8929 Other chronic pain: Secondary | ICD-10-CM | POA: Diagnosis not present

## 2022-02-19 DIAGNOSIS — R262 Difficulty in walking, not elsewhere classified: Secondary | ICD-10-CM | POA: Diagnosis not present

## 2022-02-20 NOTE — Therapy (Signed)
OUTPATIENT PHYSICAL THERAPY TREATMENT NOTE RENEWAL   Patient Name: Michelle Bell MRN: [id number] DOB:[birth year], 46 y.o., female Today's Date: 02/21/2022   PCP/REFERRING PROVIDER: Raelyn Number, PA  END OF SESSION:   PT End of Session - 02/21/22 1015     Visit Number 7    Number of Visits 16    Date for PT Re-Evaluation 04/02/22    Authorization Type MCR/MCD    PT Start Time 1014   Pt arrived 14 mins late to appt   PT Stop Time 1045    PT Time Calculation (min) 31 min    Activity Tolerance Patient tolerated treatment well    Behavior During Therapy WFL for tasks assessed/performed                 Past Medical History:  Diagnosis Date   Anxiety    Depression    Hyperlipidemia, mixed    Hypertension    Iron 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)    previously had Iron infusion's by dr Lindi Adie   Menorrhagia with regular cycle    OA (osteoarthritis)    Past Surgical History:  Procedure Laterality Date   BREAST REDUCTION SURGERY Bilateral 01/31/2021   Procedure: MAMMARY REDUCTION  (BREAST);  Surgeon: Wallace Going, DO;  Location: [place];  Service: Plastics;  Laterality: Bilateral;   CESAREAN SECTION     FUSION OF TALONAVICULAR JOINT Left 10/29/2021   Procedure: FUSION OF TALONAVICULAR JOINT;  Surgeon: Felipa Furnace, DPM;  Location: [place];  Service: Podiatry;  Laterality: Left;  GENERAL WITH BLOCK   MASS EXCISION Left 12/23/2016   Procedure: EXCISION LEFT GROIN MASS;  Surgeon: Coralie Keens, MD;  Location: [facility];  Service: General;  Laterality: Left;   SUBTALAR JOINT ARTHROEREISIS Left 10/29/2021   Procedure: SUBTALAR JOINT ARTHRODESIS;  Surgeon: Felipa Furnace, DPM;  Location: [place];  Service: Podiatry;  Laterality: Left;   Patient Active Problem List   Diagnosis Date Noted   Back pain 06/09/2020   Neck pain 06/09/2020   Symptomatic mammary hypertrophy 06/09/2020   Iron  deficiency anemia 10/14/2018    REFERRING DIAG: ankle fusion  THERAPY DIAG:  Status post ankle fusion  Localized edema  Difficulty in walking, not elsewhere classified  Stiffness of left ankle, not elsewhere classified  Other chronic pain  PERTINENT HISTORY: see snapshot   PRECAUTIONS: CAM boot WBAT   SUBJECTIVE: Patient says she isn't in any pain. She states she went to the gym yesterday and walked around without the boot and that the concrete floor made her foot sore. She also reports that he MD stated she can come out of the boot, encouraged patient to wear shoe as she came in the boot.  PAIN:  Are you having pain? Yes: NPRS scale: 0/10 currently  Pain location: L ankle  Pain description: tight  Aggravating factors: standing too long  Relieving factors: rest,elevation   OBJECTIVE: (objective measures completed at initial evaluation unless otherwise dated)  DIAGNOSTIC FINDINGS: Radiographs: 3 views of skeletally mature adult left foot: Hardware is intact no signs of loosening or backing out noted.  Good correction alignment noted.   There are some consolidation noted.    PATIENT SURVEYS:  FOTO 29%   COGNITION:           Overall cognitive status: Within functional limits for tasks assessed  SENSATION: Light touch: Impaired numb medial ankle    POSTURE:  Standings with LLE abducted and ER to accommodate boot   PALPATION: Pain with palpation to L dorsum of foot and medially along scar, wrapping posteriorly to heel    LE ROM:   Active /PROM Right 12/28/2021 Left 12/28/2021 Left 02/06/22  Hip flexion       Hip extension       Hip abduction       Hip adduction       Hip internal rotation       Hip external rotation       Knee flexion       Knee extension       Ankle dorsiflexion 10 +6 -2 AROM/+2 PROM  Ankle plantarflexion   16 30 AROM / 35 PROM   Ankle inversion 42 0/0 0 AROM/ +2 PROM  Ankle eversion 10 8/12    (Blank rows = not  tested)   LE MMT:   MMT Right 12/28/2021 Left 12/28/2021 Lt 02/19/22  Hip flexion 4 4   Hip extension       Hip abduction       Hip adduction       Hip internal rotation       Hip external rotation       Knee flexion 5 5 5   Knee extension 5 5 5   Ankle dorsiflexion 5 5 5   Ankle plantarflexion 4 4- 4-  Ankle inversion 5 2- 3  Ankle eversion 5 2- 3-   (Blank rows = not tested)     FUNCTIONAL TESTS:  Sit to stand x 5 , 15 sec NO UEs   GAIT: Distance walked: 150 Assistive device utilized: Single point cane Level of assistance: Modified independence Comments: LLE abducted, external rotation of hip   [facility] Adult PT Treatment:                                                DATE: 02/21/2022 Therapeutic Exercise: NuStep LE L6 for 3 min  Standing heel raise bilateral 2x10 Standing hip abduction 2x10 each  Supported squat with bilateral UEs on bar x10 Step ups 4 inch x 20 LLE  Therapeutic Activity: Gait with and without cane, focus on heel strike and rolling through foot  Modalities: Vaso: L ankle, 34, pt in supine, low compression Self Care: Supportive footwear, no longer wearing boot   [facility] Adult PT Treatment:                                                DATE: 02/18/22 Therapeutic Exercise: NuStep LE L6 for 6 min  Sit to stand  x 5 15 sec Sit to stand x 10 focus on LLE weightbearing  Standing heel raise bilateral Standing hip abduction x 15 each  Supported squat with bilateral UEs on bar  Step ups 4 inch x 20 LLE  Hamstring, calf stretch x 30 sec x 3  Ankle ROM crossed leg  Therapeutic Activity: Gait with and without cane , focus on heel strike and rolling through foot  Parallel bars for assist retrogait and marching  Modalities: Cold pack 8 min L ankle  Self Care: Footwear, wean off boot  [facility] Adult PT Treatment:                                                DATE: 02/08/22 Therapeutic Exercise: Supine ankle AROM, knee extension/flexion AROM for mobility,  warm up Supine hamstring calf with sheet x 30 sec  Inversion, eversion with band for tactile cue, very challenging Manual Therapy: Retromassage, tape (1 fan for) edema) Edema: mid foot L. 9 1/4 inch Rt 8 3/4 inch   Figure 8 Lt 22 inch Rt  20 inch  Modalities: Vaso 15 min with elevation, coldest setting, mod compression  Self Care: Edema mgmt    PATIENT EDUCATION:  Education details: HEP, POC  Person educated: Patient Education method: Consulting civil engineer, Demonstration, and Handouts Education comprehension: verbalized understanding, returned demonstration, verbal cues required, tactile cues required, and needs further education     HOME EXERCISE PROGRAM: Access Code: [email or web address] URL: [email or web address] Date: 12/28/2021 Prepared by: Raeford Razor   Exercises - Seated Toe Towel Scrunches  - 2-3 x daily - 7 x weekly - 2 sets - 10 reps - 5 hold - Seated Heel Raise  - 2-3 x daily - 7 x weekly - 2 sets - 10 reps - 5 hold - Supine Ankle Circles  - 2-3 x daily - 7 x weekly - 1 sets - 5 reps - 30 hold - Supine Single Leg Ankle Pumps  - 2-3 x daily - 7 x weekly - 2 sets - 10 reps - 5 hold - Seated Gastroc Stretch with Strap  - 2-3 x daily - 7 x weekly - 1 sets - 5 reps - 30 hold   ASSESSMENT:   CLINICAL IMPRESSION: Patient arrived 14 minutes late to appointment, delaying treatment time. Patient repots that her MD stated she no longer has to use her boot and she was encouraged to wear supportive shoes from now on. Session today focused on LLE strengthening and weight bearing as well as gait training. She remains externally rotated when walking and requires frequent verbal cues for heel strike and rolling through as well as looking up while ambulating. Patient was able to tolerate all prescribed exercises with no adverse effects. Patient continues to benefit from skilled PT services and should be progressed as able to improve functional independence.     OBJECTIVE IMPAIRMENTS Abnormal  gait, decreased activity tolerance, decreased balance, decreased mobility, difficulty walking, decreased ROM, decreased strength, hypomobility, increased edema, increased fascial restrictions, impaired flexibility, postural dysfunction, obesity, and pain.    ACTIVITY LIMITATIONS cleaning, community activity, meal prep, occupation, laundry, and shopping.    PERSONAL FACTORS Time since onset of injury/illness/exacerbation and 1-2 comorbidities: OA, depression  are also affecting patient's functional outcome.      REHAB POTENTIAL: Excellent   CLINICAL DECISION MAKING: Stable/uncomplicated   EVALUATION COMPLEXITY: Low     GOALS: Goals reviewed with patient? No   SHORT TERM GOALS: Target date: 01/25/2022   Pt will be able to show independence with HEP for LE strength and ROM  Baseline: Goal status: MET   2.  Pt will be able to demo increased ability to weight bear through LLE with transfers and gait  Baseline:  Goal status MET   3.  Pt will verbalize measures to reduce swelling and pain at home  Baseline:  Goal status: MET     LONG TERM GOALS: Target date: 02/22/2022  Pt will be able to walk without the cane and min noticeable limp for 500 feet in the clinic Baseline:  Goal status: ongoing    2.  Pt will be able to improve FOTO score to > 60% to demo improved functional mobility.  Baseline:  55% as of renewal  Goal status: ongoing    3.  Pt will be able to negotiate stairs (14 +) with 1 rail reciprocally and min pain in LLE  Baseline:  Goal status: ongoing    4.  Pt will be able to perform static balance exercises (SLS, tandem) for 30 sec at a time with light UE support Baseline:  Goal status: ongoing      PLAN: PT FREQUENCY: 2x/week   PT DURATION: 8 weeks   PLANNED INTERVENTIONS: Therapeutic exercises, Therapeutic activity, Neuromuscular re-education, Balance training, Gait training, Patient/Family education, Joint mobilization, Stair training, DME instructions,  Cryotherapy, Moist heat, Taping, Vasopneumatic device, and Manual therapy   PLAN FOR NEXT SESSION: review HEP, try Nustep , vaso, gait training manual standing as tol.    Evelene Croon, [place] 02/21/22 10:16 AM

## 2022-02-21 ENCOUNTER — Ambulatory Visit: Payer: Medicare Other

## 2022-02-21 DIAGNOSIS — Z981 Arthrodesis status: Secondary | ICD-10-CM | POA: Diagnosis not present

## 2022-02-21 DIAGNOSIS — R262 Difficulty in walking, not elsewhere classified: Secondary | ICD-10-CM

## 2022-02-21 DIAGNOSIS — G8929 Other chronic pain: Secondary | ICD-10-CM

## 2022-02-21 DIAGNOSIS — M25672 Stiffness of left ankle, not elsewhere classified: Secondary | ICD-10-CM

## 2022-02-21 DIAGNOSIS — R600 Localized edema: Secondary | ICD-10-CM

## 2022-02-26 ENCOUNTER — Ambulatory Visit: Payer: Medicare Other

## 2022-02-26 DIAGNOSIS — R600 Localized edema: Secondary | ICD-10-CM

## 2022-02-26 DIAGNOSIS — M25672 Stiffness of left ankle, not elsewhere classified: Secondary | ICD-10-CM | POA: Diagnosis not present

## 2022-02-26 DIAGNOSIS — Z981 Arthrodesis status: Secondary | ICD-10-CM

## 2022-02-26 DIAGNOSIS — G8929 Other chronic pain: Secondary | ICD-10-CM | POA: Diagnosis not present

## 2022-02-26 DIAGNOSIS — R262 Difficulty in walking, not elsewhere classified: Secondary | ICD-10-CM | POA: Diagnosis not present

## 2022-02-26 NOTE — Therapy (Signed)
OUTPATIENT PHYSICAL THERAPY TREATMENT NOTE RENEWAL   Patient Name: Michelle Bell MRN: [id number] DOB:1975/09/26, 46 y.o., female Today's Date: 02/26/2022   PCP/REFERRING PROVIDER: Norva Riffle, PA  END OF SESSION:   PT End of Session - 02/26/22 1004     Visit Number 8    Number of Visits 16    Date for PT Re-Evaluation 04/02/22    Authorization Type MCR/MCD    PT Start Time 1004    PT Stop Time 1045    PT Time Calculation (min) 41 min    Activity Tolerance Patient tolerated treatment well    Behavior During Therapy WFL for tasks assessed/performed                  Past Medical History:  Diagnosis Date   Anxiety    Depression    Hyperlipidemia, mixed    Hypertension    Iron 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)    previously had Iron infusion's by dr Pamelia Hoit   Menorrhagia with regular cycle    OA (osteoarthritis)    Past Surgical History:  Procedure Laterality Date   BREAST REDUCTION SURGERY Bilateral 01/31/2021   Procedure: MAMMARY REDUCTION  (BREAST);  Surgeon: Peggye Form, DO;  Location: [facility];  Service: Plastics;  Laterality: Bilateral;   CESAREAN SECTION     FUSION OF TALONAVICULAR JOINT Left 10/29/2021   Procedure: FUSION OF TALONAVICULAR JOINT;  Surgeon: Candelaria Stagers, DPM;  Location: [facility] [place];  Service: Podiatry;  Laterality: Left;  GENERAL WITH BLOCK   MASS EXCISION Left 12/23/2016   Procedure: EXCISION LEFT GROIN MASS;  Surgeon: Abigail Miyamoto, MD;  Location: [facility];  Service: General;  Laterality: Left;   SUBTALAR JOINT ARTHROEREISIS Left 10/29/2021   Procedure: SUBTALAR JOINT ARTHRODESIS;  Surgeon: Candelaria Stagers, DPM;  Location: [facility];  Service: Podiatry;  Laterality: Left;   Patient Active Problem List   Diagnosis Date Noted   Back pain 06/09/2020   Neck pain 06/09/2020   Symptomatic mammary hypertrophy 06/09/2020   Iron deficiency anemia 10/14/2018     REFERRING DIAG: ankle fusion  THERAPY DIAG:  Status post ankle fusion  Localized edema  Difficulty in walking, not elsewhere classified  Stiffness of left ankle, not elsewhere classified  PERTINENT HISTORY: see snapshot   PRECAUTIONS: CAM boot WBAT   SUBJECTIVE: Patient states her ankle and distal lower leg have been swollen lately. She reports that she goes back to [facility] on August 30th.  PAIN:  Are you having pain? Yes: NPRS scale: 0/10 currently  Pain location: L ankle  Pain description: tight  Aggravating factors: standing too long  Relieving factors: rest,elevation   OBJECTIVE: (objective measures completed at initial evaluation unless otherwise dated)  DIAGNOSTIC FINDINGS: Radiographs: 3 views of skeletally mature adult left foot: Hardware is intact no signs of loosening or backing out noted.  Good correction alignment noted.   There are some consolidation noted.    PATIENT SURVEYS:  FOTO 29%   COGNITION:           Overall cognitive status: Within functional limits for tasks assessed                          SENSATION: Light touch: Impaired numb medial ankle    POSTURE:  Standings with LLE abducted and ER to accommodate boot   PALPATION: Pain with palpation to L dorsum of foot and  medially along scar, wrapping posteriorly to heel    LE ROM:   Active /PROM Right 12/28/2021 Left 12/28/2021 Left 02/06/22  Hip flexion       Hip extension       Hip abduction       Hip adduction       Hip internal rotation       Hip external rotation       Knee flexion       Knee extension       Ankle dorsiflexion 10 +6 -2 AROM/+2 PROM  Ankle plantarflexion   16 30 AROM / 35 PROM   Ankle inversion 42 0/0 0 AROM/ +2 PROM  Ankle eversion 10 8/12    (Blank rows = not tested)   LE MMT:   MMT Right 12/28/2021 Left 12/28/2021 Lt 02/19/22  Hip flexion 4 4   Hip extension       Hip abduction       Hip adduction       Hip internal rotation       Hip  external rotation       Knee flexion 5 5 5   Knee extension 5 5 5   Ankle dorsiflexion 5 5 5   Ankle plantarflexion 4 4- 4-  Ankle inversion 5 2- 3  Ankle eversion 5 2- 3-   (Blank rows = not tested)     FUNCTIONAL TESTS:  Sit to stand x 5 , 15 sec NO UEs   GAIT: Distance walked: 150 Assistive device utilized: Single point cane Level of assistance: Modified independence Comments: LLE abducted, external rotation of hip   [facility] Adult PT Treatment:                                                DATE: 02/26/2022 Therapeutic Exercise: NuStep LE L6 for 5 min  Slant board calf stretch x2'  Standing heel raise bilateral 2x10 Standing toe raises 2x10 (small range on L) Standing hip abduction 2x10 each  Supported squat with bilateral UEs on bar 2x10 (cues to keep head and chest lifted, send hips back) Step ups 4 inch x 20 LLE  Therapeutic Activity: Gait with cane, focus on heel strike and rolling through foot x50 ft Modalities: Vaso: L ankle, 34, pt in supine, low compression    [facility] Adult PT Treatment:                                                DATE: 02/21/2022 Therapeutic Exercise: NuStep LE L6 for 3 min  Standing heel raise bilateral 2x10 Standing hip abduction 2x10 each  Supported squat with bilateral UEs on bar x10 Step ups 4 inch x 20 LLE  Therapeutic Activity: Gait with and without cane, focus on heel strike and rolling through foot  Modalities: Vaso: L ankle, 34, pt in supine, low compression Self Care: Supportive footwear, no longer wearing boot   [facility] Adult PT Treatment:                                                DATE: 02/18/22 Therapeutic Exercise: NuStep  LE L6 for 6 min  Sit to stand  x 5 15 sec Sit to stand x 10 focus on LLE weightbearing  Standing heel raise bilateral Standing hip abduction x 15 each  Supported squat with bilateral UEs on bar  Step ups 4 inch x 20 LLE  Hamstring, calf stretch x 30 sec x 3  Ankle ROM crossed leg  Therapeutic  Activity: Gait with and without cane , focus on heel strike and rolling through foot  Parallel bars for assist retrogait and marching  Modalities: Cold pack 8 min L ankle  Self Care: Footwear, wean off boot     PATIENT EDUCATION:  Education details: HEP, POC  Person educated: Patient Education method: Explanation, Demonstration, and Handouts Education comprehension: verbalized understanding, returned demonstration, verbal cues required, tactile cues required, and needs further education     HOME EXERCISE PROGRAM: Access Code: [email or web address] URL: [email or web address] Date: 12/28/2021 Prepared by: Karie Mainland   Exercises - Seated Toe Towel Scrunches  - 2-3 x daily - 7 x weekly - 2 sets - 10 reps - 5 hold - Seated Heel Raise  - 2-3 x daily - 7 x weekly - 2 sets - 10 reps - 5 hold - Supine Ankle Circles  - 2-3 x daily - 7 x weekly - 1 sets - 5 reps - 30 hold - Supine Single Leg Ankle Pumps  - 2-3 x daily - 7 x weekly - 2 sets - 10 reps - 5 hold - Seated Gastroc Stretch with Strap  - 2-3 x daily - 7 x weekly - 1 sets - 5 reps - 30 hold   ASSESSMENT:   CLINICAL IMPRESSION: Patient presents to PT no longer ambulating in CAM boot and reports this is going well and has not had any adverse effects. She has noticed more swelling in her distal LE and ankle lately, encouraged to continue icing and elevating as needed. She remains externally rotated when ambulating and requires frequent verbal cues for heel strike and rolling through. Patient was able to tolerate all prescribed exercises with no adverse effects. Patient continues to benefit from skilled PT services and should be progressed as able to improve functional independence.     OBJECTIVE IMPAIRMENTS Abnormal gait, decreased activity tolerance, decreased balance, decreased mobility, difficulty walking, decreased ROM, decreased strength, hypomobility, increased edema, increased fascial restrictions, impaired flexibility,  postural dysfunction, obesity, and pain.    ACTIVITY LIMITATIONS cleaning, community activity, meal prep, occupation, laundry, and shopping.    PERSONAL FACTORS Time since onset of injury/illness/exacerbation and 1-2 comorbidities: OA, depression  are also affecting patient's functional outcome.      REHAB POTENTIAL: Excellent   CLINICAL DECISION MAKING: Stable/uncomplicated   EVALUATION COMPLEXITY: Low     GOALS: Goals reviewed with patient? No   SHORT TERM GOALS: Target date: 01/25/2022   Pt will be able to show independence with HEP for LE strength and ROM  Baseline: Goal status: MET   2.  Pt will be able to demo increased ability to weight bear through LLE with transfers and gait  Baseline:  Goal status MET   3.  Pt will verbalize measures to reduce swelling and pain at home  Baseline:  Goal status: MET     LONG TERM GOALS: Target date: 02/22/2022   Pt will be able to walk without the cane and min noticeable limp for 500 feet in the clinic Baseline:  Goal status: ongoing    2.  Pt will be able to improve FOTO  score to > 60% to demo improved functional mobility.  Baseline:  55% as of renewal  Goal status: ongoing    3.  Pt will be able to negotiate stairs (14 +) with 1 rail reciprocally and min pain in LLE  Baseline:  Goal status: ongoing    4.  Pt will be able to perform static balance exercises (SLS, tandem) for 30 sec at a time with light UE support Baseline:  Goal status: ongoing      PLAN: PT FREQUENCY: 2x/week   PT DURATION: 8 weeks   PLANNED INTERVENTIONS: Therapeutic exercises, Therapeutic activity, Neuromuscular re-education, Balance training, Gait training, Patient/Family education, Joint mobilization, Stair training, DME instructions, Cryotherapy, Moist heat, Taping, Vasopneumatic device, and Manual therapy   PLAN FOR NEXT SESSION: review HEP, try Nustep , vaso, gait training manual standing as tol.     Harland German, PTA 02/26/22 10:05  AM

## 2022-02-28 ENCOUNTER — Encounter: Payer: Self-pay | Admitting: Physical Therapy

## 2022-02-28 ENCOUNTER — Ambulatory Visit: Payer: Medicare Other | Admitting: Physical Therapy

## 2022-02-28 DIAGNOSIS — R262 Difficulty in walking, not elsewhere classified: Secondary | ICD-10-CM | POA: Diagnosis not present

## 2022-02-28 DIAGNOSIS — Z981 Arthrodesis status: Secondary | ICD-10-CM

## 2022-02-28 DIAGNOSIS — G8929 Other chronic pain: Secondary | ICD-10-CM | POA: Diagnosis not present

## 2022-02-28 DIAGNOSIS — R600 Localized edema: Secondary | ICD-10-CM | POA: Diagnosis not present

## 2022-02-28 DIAGNOSIS — M25672 Stiffness of left ankle, not elsewhere classified: Secondary | ICD-10-CM | POA: Diagnosis not present

## 2022-02-28 NOTE — Therapy (Signed)
OUTPATIENT PHYSICAL THERAPY TREATMENT NOTE  Patient Name: Michelle Bell MRN: [id number] DOB:05-02-1976, 46 y.o., female Today's Date: 02/28/2022   PCP/REFERRING PROVIDER: Raelyn Number, PA  END OF SESSION:   PT End of Session - 02/28/22 1023     Visit Number 9    Number of Visits 16    Date for PT Re-Evaluation 04/02/22    Authorization Type MCR/MCD    PT Start Time 1020    PT Stop Time 1109    PT Time Calculation (min) 49 min    Activity Tolerance Patient tolerated treatment well    Behavior During Therapy WFL for tasks assessed/performed                   Past Medical History:  Diagnosis Date   Anxiety    Depression    Hyperlipidemia, mixed    Hypertension    Iron 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)    previously had Iron infusion's by dr Lindi Adie   Menorrhagia with regular cycle    OA (osteoarthritis)    Past Surgical History:  Procedure Laterality Date   BREAST REDUCTION SURGERY Bilateral 01/31/2021   Procedure: MAMMARY REDUCTION  (BREAST);  Surgeon: Wallace Going, DO;  Location: [place];  Service: Plastics;  Laterality: Bilateral;   CESAREAN SECTION     FUSION OF TALONAVICULAR JOINT Left 10/29/2021   Procedure: FUSION OF TALONAVICULAR JOINT;  Surgeon: Felipa Furnace, DPM;  Location: [place];  Service: Podiatry;  Laterality: Left;  GENERAL WITH BLOCK   MASS EXCISION Left 12/23/2016   Procedure: EXCISION LEFT GROIN MASS;  Surgeon: Coralie Keens, MD;  Location: [facility];  Service: General;  Laterality: Left;   SUBTALAR JOINT ARTHROEREISIS Left 10/29/2021   Procedure: SUBTALAR JOINT ARTHRODESIS;  Surgeon: Felipa Furnace, DPM;  Location: [place];  Service: Podiatry;  Laterality: Left;   Patient Active Problem List   Diagnosis Date Noted   Back pain 06/09/2020   Neck pain 06/09/2020   Symptomatic mammary hypertrophy 06/09/2020   Iron deficiency anemia 10/14/2018    REFERRING  DIAG: ankle fusion  THERAPY DIAG:  Status post ankle fusion  Localized edema  Difficulty in walking, not elsewhere classified  Stiffness of left ankle, not elsewhere classified  Other chronic pain  PERTINENT HISTORY: see snapshot   PRECAUTIONS: CAM boot WBAT   SUBJECTIVE: Moving next week so I wont have do steps like I do now.  14 steps right now. No pain just tight and swollen.    PAIN:  Are you having pain? Yes: NPRS scale: 0/10 currently  Pain location: L ankle  Pain description: tight  Aggravating factors: standing too long  Relieving factors: rest,elevation   OBJECTIVE: (objective measures completed at initial evaluation unless otherwise dated)  DIAGNOSTIC FINDINGS: Radiographs: 3 views of skeletally mature adult left foot: Hardware is intact no signs of loosening or backing out noted.  Good correction alignment noted.   There are some consolidation noted.    PATIENT SURVEYS:  FOTO 29%   COGNITION:           Overall cognitive status: Within functional limits for tasks assessed                          SENSATION: Light touch: Impaired numb medial ankle    POSTURE:  Standings with LLE abducted and ER to accommodate boot   PALPATION: Pain with palpation to L dorsum  of foot and medially along scar, wrapping posteriorly to heel    LE ROM:   Active /PROM Right 12/28/2021 Left 12/28/2021 Left 02/06/22  Hip flexion       Hip extension       Hip abduction       Hip adduction       Hip internal rotation       Hip external rotation       Knee flexion       Knee extension       Ankle dorsiflexion 10 +6 -2 AROM/+2 PROM  Ankle plantarflexion   16 30 AROM / 35 PROM   Ankle inversion 42 0/0 0 AROM/ +2 PROM  Ankle eversion 10 8/12    (Blank rows = not tested)   LE MMT:   MMT Right 12/28/2021 Left 12/28/2021 Lt 02/19/22  Hip flexion 4 4   Hip extension       Hip abduction       Hip adduction       Hip internal rotation       Hip external rotation       Knee  flexion 5 5 5   Knee extension 5 5 5   Ankle dorsiflexion 5 5 5   Ankle plantarflexion 4 4- 4-  Ankle inversion 5 2- 3  Ankle eversion 5 2- 3-   (Blank rows = not tested)     FUNCTIONAL TESTS:  Sit to stand x 5 , 15 sec NO UEs   GAIT: Distance walked: 150 Assistive device utilized: Single point cane Level of assistance: Modified independence Comments: LLE abducted, external rotation of hip    [facility] Adult PT Treatment:                                                DATE: 02/28/22 Therapeutic Exercise: NuStep L6 UE and LE for 6 min  Wall for LE strengthening: Wall slides x 10 Glute med isometric 5 sec x 5 each LE  Toe lifts x 30 sec , heel lifts x 30 sec , alt.  Calf stretch on wall x 30 sec x 3  Small ROM hip abduction green looped TB on front of shoe x 12 each side  Sidefacing high knee march (hip flex.knee [email or web address])  Heel raises double leg x 20  Modalities: Vaso 15 min ankle as previous coldest setting and low pressure   [facility] Adult PT Treatment:                                                DATE: 02/26/2022 Therapeutic Exercise: NuStep LE L6 for 5 min  Slant board calf stretch x2'  Standing heel raise bilateral 2x10 Standing toe raises 2x10 (small range on L) Standing hip abduction 2x10 each  Supported squat with bilateral UEs on bar 2x10 (cues to keep head and chest lifted, send hips back) Step ups 4 inch x 20 LLE  Therapeutic Activity: Gait with cane, focus on heel strike and rolling through foot x50 ft Modalities: Vaso: L ankle, 34, pt in supine, low compression    [facility] Adult PT Treatment:  DATE: 02/21/2022 Therapeutic Exercise: NuStep LE L6 for 3 min  Standing heel raise bilateral 2x10 Standing hip abduction 2x10 each  Supported squat with bilateral UEs on bar x10 Step ups 4 inch x 20 LLE  Therapeutic Activity: Gait with and without cane, focus on heel strike and rolling through foot  Modalities: Vaso: L ankle,  34, pt in supine, low compression Self Care: Supportive footwear, no longer wearing boot   [facility] Adult PT Treatment:                                                DATE: 02/18/22 Therapeutic Exercise: NuStep LE L6 for 6 min  Sit to stand  x 5 15 sec Sit to stand x 10 focus on LLE weightbearing  Standing heel raise bilateral Standing hip abduction x 15 each  Supported squat with bilateral UEs on bar  Step ups 4 inch x 20 LLE  Hamstring, calf stretch x 30 sec x 3  Ankle ROM crossed leg  Therapeutic Activity: Gait with and without cane , focus on heel strike and rolling through foot  Parallel bars for assist retrogait and marching  Modalities: Cold pack 8 min L ankle  Self Care: Footwear, wean off boot     PATIENT EDUCATION:  Education details: HEP, POC  Person educated: Patient Education method: Explanation, Demonstration, and Handouts Education comprehension: verbalized understanding, returned demonstration, verbal cues required, tactile cues required, and needs further education     HOME EXERCISE PROGRAM: Access Code: [email or web address] URL: [email or web address] Date: 12/28/2021 Prepared by: Raeford Razor   Exercises - Seated Toe Towel Scrunches  - 2-3 x daily - 7 x weekly - 2 sets - 10 reps - 5 hold - Seated Heel Raise  - 2-3 x daily - 7 x weekly - 2 sets - 10 reps - 5 hold - Supine Ankle Circles  - 2-3 x daily - 7 x weekly - 1 sets - 5 reps - 30 hold - Supine Single Leg Ankle Pumps  - 2-3 x daily - 7 x weekly - 2 sets - 10 reps - 5 hold - Seated Gastroc Stretch with Strap  - 2-3 x daily - 7 x weekly - 1 sets - 5 reps - 30 hold   ASSESSMENT:   CLINICAL IMPRESSION: Patient walking with her cane and still no boot.  She has some tingling/pins and needles in her foot but intermittently. She shows decreased ability to weightbearing fully on her LLE.  Standing tolerance is improving.  Working on alignment in LLE, including hip external rotation.       OBJECTIVE  IMPAIRMENTS Abnormal gait, decreased activity tolerance, decreased balance, decreased mobility, difficulty walking, decreased ROM, decreased strength, hypomobility, increased edema, increased fascial restrictions, impaired flexibility, postural dysfunction, obesity, and pain.    ACTIVITY LIMITATIONS cleaning, community activity, meal prep, occupation, laundry, and shopping.    PERSONAL FACTORS Time since onset of injury/illness/exacerbation and 1-2 comorbidities: OA, depression  are also affecting patient's functional outcome.      REHAB POTENTIAL: Excellent   CLINICAL DECISION MAKING: Stable/uncomplicated   EVALUATION COMPLEXITY: Low     GOALS: Goals reviewed with patient? No   SHORT TERM GOALS: Target date: 01/25/2022   Pt will be able to show independence with HEP for LE strength and ROM  Baseline: Goal status: MET   2.  Pt will be able  to demo increased ability to weight bear through LLE with transfers and gait  Baseline:  Goal status MET   3.  Pt will verbalize measures to reduce swelling and pain at home  Baseline:  Goal status: MET     LONG TERM GOALS: Target date: 02/22/2022   Pt will be able to walk without the cane and min noticeable limp for 500 feet in the clinic Baseline:  Goal status: ongoing    2.  Pt will be able to improve FOTO score to > 60% to demo improved functional mobility.  Baseline:  55% as of renewal  Goal status: ongoing    3.  Pt will be able to negotiate stairs (14 +) with 1 rail reciprocally and min pain in LLE  Baseline:  Goal status: ongoing    4.  Pt will be able to perform static balance exercises (SLS, tandem) for 30 sec at a time with light UE support Baseline:  Goal status: ongoing      PLAN: PT FREQUENCY: 2x/week   PT DURATION: 8 weeks   PLANNED INTERVENTIONS: Therapeutic exercises, Therapeutic activity, Neuromuscular re-education, Balance training, Gait training, Patient/Family education, Joint mobilization, Stair training,  DME instructions, Cryotherapy, Moist heat, Taping, Vasopneumatic device, and Manual therapy   PLAN FOR NEXT SESSION: review HEP, try Nustep , vaso, gait training manual standing as Isabelle Course, PT 02/28/22 11:02 AM Phone: [phone] Fax: [phone]

## 2022-03-13 ENCOUNTER — Encounter: Payer: Self-pay | Admitting: Physical Therapy

## 2022-03-13 ENCOUNTER — Ambulatory Visit: Payer: Medicare Other | Attending: Podiatry | Admitting: Physical Therapy

## 2022-03-13 DIAGNOSIS — Z981 Arthrodesis status: Secondary | ICD-10-CM | POA: Insufficient documentation

## 2022-03-13 DIAGNOSIS — R262 Difficulty in walking, not elsewhere classified: Secondary | ICD-10-CM | POA: Diagnosis not present

## 2022-03-13 DIAGNOSIS — M25672 Stiffness of left ankle, not elsewhere classified: Secondary | ICD-10-CM | POA: Insufficient documentation

## 2022-03-13 DIAGNOSIS — R600 Localized edema: Secondary | ICD-10-CM | POA: Insufficient documentation

## 2022-03-13 NOTE — Therapy (Signed)
OUTPATIENT PHYSICAL THERAPY TREATMENT NOTE  Patient Name: Michelle Bell MRN: [id number] DOB:[birth year], 46 y.o., female Today's Date: 03/13/2022   PCP/REFERRING PROVIDER: Raelyn Number, PA  END OF SESSION:   PT End of Session - 03/13/22 1005     Visit Number 10    Number of Visits 16    Date for PT Re-Evaluation 04/02/22    Authorization Type MCR/MCD    PT Start Time 1000    PT Stop Time 1054    PT Time Calculation (min) 54 min                   Past Medical History:  Diagnosis Date   Anxiety    Depression    Hyperlipidemia, mixed    Hypertension    Iron 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)    previously had Iron infusion's by dr Lindi Adie   Menorrhagia with regular cycle    OA (osteoarthritis)    Past Surgical History:  Procedure Laterality Date   BREAST REDUCTION SURGERY Bilateral 01/31/2021   Procedure: MAMMARY REDUCTION  (BREAST);  Surgeon: Wallace Going, DO;  Location: [place];  Service: Plastics;  Laterality: Bilateral;   CESAREAN SECTION     FUSION OF TALONAVICULAR JOINT Left 10/29/2021   Procedure: FUSION OF TALONAVICULAR JOINT;  Surgeon: Felipa Furnace, DPM;  Location: [place];  Service: Podiatry;  Laterality: Left;  GENERAL WITH BLOCK   MASS EXCISION Left 12/23/2016   Procedure: EXCISION LEFT GROIN MASS;  Surgeon: Coralie Keens, MD;  Location: [facility];  Service: General;  Laterality: Left;   SUBTALAR JOINT ARTHROEREISIS Left 10/29/2021   Procedure: SUBTALAR JOINT ARTHRODESIS;  Surgeon: Felipa Furnace, DPM;  Location: [place];  Service: Podiatry;  Laterality: Left;   Patient Active Problem List   Diagnosis Date Noted   Back pain 06/09/2020   Neck pain 06/09/2020   Symptomatic mammary hypertrophy 06/09/2020   Iron deficiency anemia 10/14/2018    REFERRING DIAG: ankle fusion  THERAPY DIAG:  Status post ankle fusion  Localized edema  Difficulty in walking, not  elsewhere classified  Stiffness of left ankle, not elsewhere classified  PERTINENT HISTORY: see snapshot   PRECAUTIONS: CAM boot WBAT   SUBJECTIVE: I just finished moving to a first floor apartment yesterday. My foot is painful and swollen. I would like the ice machine today to help with the swelling.   PAIN:  Are you having pain? Yes: NPRS scale: 9/10 currently  Pain location: L ankle  Pain description: tight  Aggravating factors: standing too long  Relieving factors: rest,elevation   OBJECTIVE: (objective measures completed at initial evaluation unless otherwise dated)  DIAGNOSTIC FINDINGS: Radiographs: 3 views of skeletally mature adult left foot: Hardware is intact no signs of loosening or backing out noted.  Good correction alignment noted.   There are some consolidation noted.    PATIENT SURVEYS:  FOTO 29% FOTO 56% 02/19/22   COGNITION:           Overall cognitive status: Within functional limits for tasks assessed                          SENSATION: Light touch: Impaired numb medial ankle    POSTURE:  Standings with LLE abducted and ER to accommodate boot   PALPATION: Pain with palpation to L dorsum of foot and medially along scar, wrapping posteriorly to heel    LE ROM:  Active /PROM Right 12/28/2021 Left 12/28/2021 Left 02/06/22 Left  03/12/22  Hip flexion        Hip extension        Hip abduction        Hip adduction        Hip internal rotation        Hip external rotation        Knee flexion        Knee extension        Ankle dorsiflexion 10 +6 -2 AROM/+2 PROM 0 AROM  Ankle plantarflexion   16 30 AROM / 35 PROM  20 AROM  Ankle inversion 42 0/0 0 AROM/ +2 PROM   Ankle eversion 10 8/12     (Blank rows = not tested)   LE MMT:   MMT Right 12/28/2021 Left 12/28/2021 Lt 02/19/22  Hip flexion 4 4   Hip extension       Hip abduction       Hip adduction       Hip internal rotation       Hip external rotation       Knee flexion _0 Knee extension _1 Ankle dorsiflexion _2 Ankle plantarflexion 4 4- 4-  Ankle inversion 5 2- 3  Ankle eversion 5 2- 3-   (Blank rows = not tested)     FUNCTIONAL TESTS:  Sit to stand x 5 , 15 sec NO UEs   GAIT: Distance walked: 150 Assistive device utilized: Single point cane Level of assistance: Modified independence Comments: LLE abducted, external rotation of hip    [facility] Adult PT Treatment:                                                DATE: 03/13/22 Therapeutic Exercise: NuStep L6 UE and LE for 6 min  Supported squats at bar 10 x 2 Heel raises double leg x 20  Blue rocker board A/P weight shifting Blue rocker board lateral weight shifting Calf stretch standing Heel raises double leg x 20   SLR 2 x 10 each Side hip abduction 2 x 10 each    Modalities: Edema circum. 33 cm left ankle  Vaso 15 min ankle as previous coldest setting and low pressure  [facility] Adult PT Treatment:                                                DATE: 02/28/22 Therapeutic Exercise: NuStep L6 UE and LE for 6 min   Wall for LE strengthening: Wall slides x 10 Glute med isometric 5 sec x 5 each LE  Toe lifts x 30 sec , heel lifts x 30 sec , alt.  Calf stretch on wall x 30 sec x 3  Small ROM hip abduction green looped TB on front of shoe x 12 each side  Sidefacing high knee march (hip flex.knee flex.DF)   Modalities: Vaso 15 min ankle as previous coldest setting and low pressure   [facility] Adult PT Treatment:  DATE: 02/26/2022 Therapeutic Exercise: NuStep LE L6 for 5 min  Slant board calf stretch x2'  Standing heel raise bilateral 2x10 Standing toe raises 2x10 (small range on L) Standing hip abduction 2x10 each  Supported squat with bilateral UEs on bar 2x10 (cues to keep head and chest lifted, send hips back) Step ups 4 inch x 20 LLE  Therapeutic Activity: Gait with cane, focus on heel strike and rolling through foot x50 ft Modalities: Vaso: L ankle,  34, pt in supine, low compression    [facility] Adult PT Treatment:                                                DATE: 02/21/2022 Therapeutic Exercise: NuStep LE L6 for 3 min  Standing heel raise bilateral 2x10 Standing hip abduction 2x10 each  Supported squat with bilateral UEs on bar x10 Step ups 4 inch x 20 LLE  Therapeutic Activity: Gait with and without cane, focus on heel strike and rolling through foot  Modalities: Vaso: L ankle, 34, pt in supine, low compression Self Care: Supportive footwear, no longer wearing boot   [facility] Adult PT Treatment:                                                DATE: 02/18/22 Therapeutic Exercise: NuStep LE L6 for 6 min  Sit to stand  x 5 15 sec Sit to stand x 10 focus on LLE weightbearing  Standing heel raise bilateral Standing hip abduction x 15 each  Supported squat with bilateral UEs on bar  Step ups 4 inch x 20 LLE  Hamstring, calf stretch x 30 sec x 3  Ankle ROM crossed leg  Therapeutic Activity: Gait with and without cane , focus on heel strike and rolling through foot  Parallel bars for assist retrogait and marching  Modalities: Cold pack 8 min L ankle  Self Care: Footwear, wean off boot     PATIENT EDUCATION:  Education details: HEP, POC  Person educated: Patient Education method: Explanation, Demonstration, and Handouts Education comprehension: verbalized understanding, returned demonstration, verbal cues required, tactile cues required, and needs further education     HOME EXERCISE PROGRAM: Access Code: [email or web address] URL: [email or web address] Date: 12/28/2021 Prepared by: Raeford Razor   Exercises - Seated Toe Towel Scrunches  - 2-3 x daily - 7 x weekly - 2 sets - 10 reps - 5 hold - Seated Heel Raise  - 2-3 x daily - 7 x weekly - 2 sets - 10 reps - 5 hold - Supine Ankle Circles  - 2-3 x daily - 7 x weekly - 1 sets - 5 reps - 30 hold - Supine Single Leg Ankle Pumps  - 2-3 x daily - 7 x weekly - 2 sets - 10  reps - 5 hold - Seated Gastroc Stretch with Strap  - 2-3 x daily - 7 x weekly - 1 sets - 5 reps - 30 hold   ASSESSMENT:   CLINICAL IMPRESSION: Pt reports increased pain with moving apartments over the last week. Today her pain is 9/10. She demonstrates decreased weight bearing on left with heel raises. Used rocker board for forward and lateral weight shifting and she did well with these. Her DF  AROM has improved to neutral. Edema 32 cm circumference today. She continues with significant limp and step-to pattern with stairs. This may be exacerbated today due to high pain level and increased edema after moving her apartment down stairs over the last week. No additional goals met.     OBJECTIVE IMPAIRMENTS Abnormal gait, decreased activity tolerance, decreased balance, decreased mobility, difficulty walking, decreased ROM, decreased strength, hypomobility, increased edema, increased fascial restrictions, impaired flexibility, postural dysfunction, obesity, and pain.    ACTIVITY LIMITATIONS cleaning, community activity, meal prep, occupation, laundry, and shopping.    PERSONAL FACTORS Time since onset of injury/illness/exacerbation and 1-2 comorbidities: OA, depression  are also affecting patient's functional outcome.      REHAB POTENTIAL: Excellent   CLINICAL DECISION MAKING: Stable/uncomplicated   EVALUATION COMPLEXITY: Low     GOALS: Goals reviewed with patient? No   SHORT TERM GOALS: Target date: 01/25/2022   Pt will be able to show independence with HEP for LE strength and ROM  Baseline: Goal status: MET   2.  Pt will be able to demo increased ability to weight bear through LLE with transfers and gait  Baseline:  Goal status MET   3.  Pt will verbalize measures to reduce swelling and pain at home  Baseline:  Goal status: MET     LONG TERM GOALS: Target date: 02/22/2022   Pt will be able to walk without the cane and min noticeable limp for 500 feet in the clinic Baseline:   Status: 03/12/22: significant limp with SPC, toe out Goal status: ongoing    2.  Pt will be able to improve FOTO score to > 60% to demo improved functional mobility.  Baseline:  29% Status: 55% as of renewal (02/19/22) Goal status: ongoing    3.  Pt will be able to negotiate stairs (14 +) with 1 rail reciprocally and min pain in LLE  Baseline: unable  Status: 03/12/22: uses step to pattern  Goal status: ongoing    4.  Pt will be able to perform static balance exercises (SLS, tandem) for 30 sec at a time with light UE support Baseline: not tested Goal status: ongoing      PLAN: PT FREQUENCY: 2x/week   PT DURATION: 8 weeks   PLANNED INTERVENTIONS: Therapeutic exercises, Therapeutic activity, Neuromuscular re-education, Balance training, Gait training, Patient/Family education, Joint mobilization, Stair training, DME instructions, Cryotherapy, Moist heat, Taping, Vasopneumatic device, and Manual therapy   PLAN FOR NEXT SESSION: review HEP, try Nustep , vaso, gait training manual standing as tol. , edema management      Hessie Diener, PTA 03/13/22 10:44 AM Phone: [phone] Fax: [phone]

## 2022-03-19 ENCOUNTER — Ambulatory Visit: Payer: Medicare Other | Admitting: Physical Therapy

## 2022-03-20 NOTE — Therapy (Signed)
OUTPATIENT PHYSICAL THERAPY TREATMENT NOTE  Patient Name: Michelle Bell MRN: [id number] DOB:1976/05/25, 46 y.o., female Today's Date: 03/21/2022   PCP/REFERRING PROVIDER: Raelyn Number, PA  END OF SESSION:   PT End of Session - 03/21/22 0944     Visit Number 11    Number of Visits 16    Date for PT Re-Evaluation 04/02/22    Authorization Type MCR/MCD    PT Start Time 1000    PT Stop Time 1050    PT Time Calculation (min) 50 min    Activity Tolerance Patient tolerated treatment well    Behavior During Therapy WFL for tasks assessed/performed             Past Medical History:  Diagnosis Date   Anxiety    Depression    Hyperlipidemia, mixed    Hypertension    Iron 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)    previously had Iron infusion's by dr Lindi Adie   Menorrhagia with regular cycle    OA (osteoarthritis)    Past Surgical History:  Procedure Laterality Date   BREAST REDUCTION SURGERY Bilateral 01/31/2021   Procedure: MAMMARY REDUCTION  (BREAST);  Surgeon: Wallace Going, DO;  Location: [place];  Service: Plastics;  Laterality: Bilateral;   CESAREAN SECTION     FUSION OF TALONAVICULAR JOINT Left 10/29/2021   Procedure: FUSION OF TALONAVICULAR JOINT;  Surgeon: Felipa Furnace, DPM;  Location: [place];  Service: Podiatry;  Laterality: Left;  GENERAL WITH BLOCK   MASS EXCISION Left 12/23/2016   Procedure: EXCISION LEFT GROIN MASS;  Surgeon: Coralie Keens, MD;  Location: [facility];  Service: General;  Laterality: Left;   SUBTALAR JOINT ARTHROEREISIS Left 10/29/2021   Procedure: SUBTALAR JOINT ARTHRODESIS;  Surgeon: Felipa Furnace, DPM;  Location: [place];  Service: Podiatry;  Laterality: Left;   Patient Active Problem List   Diagnosis Date Noted   Back pain 06/09/2020   Neck pain 06/09/2020   Symptomatic mammary hypertrophy 06/09/2020   Iron deficiency anemia 10/14/2018    REFERRING DIAG: ankle  fusion  THERAPY DIAG:  Status post ankle fusion  Localized edema  Difficulty in walking, not elsewhere classified  PERTINENT HISTORY: see snapshot   PRECAUTIONS: CAM boot WBAT   SUBJECTIVE: I'm hurting really bad today and I feel like my foot needs to be drained or something because it is so swollen.  PAIN:  Are you having pain? Yes: NPRS scale: 8/10 currently  Pain location: L ankle  Pain description: tight  Aggravating factors: standing too long  Relieving factors: rest,elevation   OBJECTIVE: (objective measures completed at initial evaluation unless otherwise dated)  DIAGNOSTIC FINDINGS: Radiographs: 3 views of skeletally mature adult left foot: Hardware is intact no signs of loosening or backing out noted.  Good correction alignment noted.   There are some consolidation noted.    PATIENT SURVEYS:  FOTO 29% FOTO 56% 02/19/22   COGNITION:           Overall cognitive status: Within functional limits for tasks assessed                          SENSATION: Light touch: Impaired numb medial ankle    POSTURE:  Standings with LLE abducted and ER to accommodate boot   PALPATION: Pain with palpation to L dorsum of foot and medially along scar, wrapping posteriorly to heel    LE ROM:   Active Darlina Sicilian  Right 12/28/2021 Left 12/28/2021 Left 02/06/22 Left  03/12/22  Hip flexion        Hip extension        Hip abduction        Hip adduction        Hip internal rotation        Hip external rotation        Knee flexion        Knee extension        Ankle dorsiflexion 10 +6 -2 AROM/+2 PROM 0 AROM  Ankle plantarflexion   16 30 AROM / 35 PROM  20 AROM  Ankle inversion 42 0/0 0 AROM/ +2 PROM   Ankle eversion 10 8/12     (Blank rows = not tested)   LE MMT:   MMT Right 12/28/2021 Left 12/28/2021 Lt 02/19/22  Hip flexion 4 4   Hip extension       Hip abduction       Hip adduction       Hip internal rotation       Hip external rotation       Knee flexion _0 Knee  extension _1 Ankle dorsiflexion _2 Ankle plantarflexion 4 4- 4-  Ankle inversion 5 2- 3  Ankle eversion 5 2- 3-   (Blank rows = not tested)     FUNCTIONAL TESTS:  Sit to stand x 5 , 15 sec NO UEs   GAIT: Distance walked: 150 Assistive device utilized: Single point cane Level of assistance: Modified independence Comments: LLE abducted, external rotation of hip    [facility] Adult PT Treatment:                                                DATE: 03/21/2022 Therapeutic Exercise: NuStep L6 UE and LE for 6 min  Supported squats at bar 10 x 2 Heel raises double leg x 20  Blue rocker board A/P weight shifting Blue rocker board lateral weight shifting Slant board gastroc stretch 2x1' Toe raises x20 Side hip abduction 2 x 10 each  Modalities: Vaso 15 min ankle as previous coldest setting and low pressure Edema circum. 33 cm left ankle   [facility] Adult PT Treatment:                                                DATE: 03/13/22 Therapeutic Exercise: NuStep L6 UE and LE for 6 min  Supported squats at bar 10 x 2 Heel raises double leg x 20  Blue rocker board A/P weight shifting Blue rocker board lateral weight shifting Calf stretch standing Heel raises double leg x 20   SLR 2 x 10 each Side hip abduction 2 x 10 each    Modalities: Edema circum. 33 cm left ankle  Vaso 15 min ankle as previous coldest setting and low pressure  [facility] Adult PT Treatment:                                                DATE: 02/28/22 Therapeutic Exercise: NuStep L6 UE  and LE for 6 min   Wall for LE strengthening: Wall slides x 10 Glute med isometric 5 sec x 5 each LE  Toe lifts x 30 sec , heel lifts x 30 sec , alt.  Calf stretch on wall x 30 sec x 3  Small ROM hip abduction green looped TB on front of shoe x 12 each side  Sidefacing high knee march (hip [email or web address] [email or web address])   Modalities: Vaso 15 min ankle as previous coldest setting and low pressure   [facility] Adult PT Treatment:                                                 DATE: 02/26/2022 Therapeutic Exercise: NuStep LE L6 for 5 min  Slant board calf stretch x2'  Standing heel raise bilateral 2x10 Standing toe raises 2x10 (small range on L) Standing hip abduction 2x10 each  Supported squat with bilateral UEs on bar 2x10 (cues to keep head and chest lifted, send hips back) Step ups 4 inch x 20 LLE  Therapeutic Activity: Gait with cane, focus on heel strike and rolling through foot x50 ft Modalities: Vaso: L ankle, 34, pt in supine, low compression   PATIENT EDUCATION:  Education details: HEP, POC  Person educated: Patient Education method: Explanation, Demonstration, and Handouts Education comprehension: verbalized understanding, returned demonstration, verbal cues required, tactile cues required, and needs further education     HOME EXERCISE PROGRAM: Access Code: [email or web address] URL: [email or web address] Date: 12/28/2021 Prepared by: Raeford Razor   Exercises - Seated Toe Towel Scrunches  - 2-3 x daily - 7 x weekly - 2 sets - 10 reps - 5 hold - Seated Heel Raise  - 2-3 x daily - 7 x weekly - 2 sets - 10 reps - 5 hold - Supine Ankle Circles  - 2-3 x daily - 7 x weekly - 1 sets - 5 reps - 30 hold - Supine Single Leg Ankle Pumps  - 2-3 x daily - 7 x weekly - 2 sets - 10 reps - 5 hold - Seated Gastroc Stretch with Strap  - 2-3 x daily - 7 x weekly - 1 sets - 5 reps - 30 hold   ASSESSMENT:   CLINICAL IMPRESSION: Patient presents to PT with high levels of pain and complaints of swelling in her R ankle and calf. Edema measurement of Lt ankle staying around 33cm and patient advised to follow up with her PCP if she remains concerned about the swelling. She was also advised to keep up with the RICE method in the mean time. Vaso utilized this session to control pain and swelling. Session today focused on L ankle and LE strengthening as well as stretching. Patient continues to benefit from skilled PT services and should  be progressed as able to improve functional independence.    OBJECTIVE IMPAIRMENTS Abnormal gait, decreased activity tolerance, decreased balance, decreased mobility, difficulty walking, decreased ROM, decreased strength, hypomobility, increased edema, increased fascial restrictions, impaired flexibility, postural dysfunction, obesity, and pain.    ACTIVITY LIMITATIONS cleaning, community activity, meal prep, occupation, laundry, and shopping.    PERSONAL FACTORS Time since onset of injury/illness/exacerbation and 1-2 comorbidities: OA, depression  are also affecting patient's functional outcome.      REHAB POTENTIAL: Excellent   CLINICAL DECISION MAKING: Stable/uncomplicated   EVALUATION COMPLEXITY: Low     GOALS: Goals reviewed with  patient? No   SHORT TERM GOALS: Target date: 01/25/2022   Pt will be able to show independence with HEP for LE strength and ROM  Baseline: Goal status: MET   2.  Pt will be able to demo increased ability to weight bear through LLE with transfers and gait  Baseline:  Goal status MET   3.  Pt will verbalize measures to reduce swelling and pain at home  Baseline:  Goal status: MET     LONG TERM GOALS: Target date: 02/22/2022   Pt will be able to walk without the cane and min noticeable limp for 500 feet in the clinic Baseline:  Status: 03/12/22: significant limp with SPC, toe out Goal status: ongoing    2.  Pt will be able to improve FOTO score to > 60% to demo improved functional mobility.  Baseline:  29% Status: 55% as of renewal (02/19/22) Goal status: ongoing    3.  Pt will be able to negotiate stairs (14 +) with 1 rail reciprocally and min pain in LLE  Baseline: unable  Status: 03/12/22: uses step to pattern  Goal status: ongoing    4.  Pt will be able to perform static balance exercises (SLS, tandem) for 30 sec at a time with light UE support Baseline: not tested Goal status: ongoing      PLAN: PT FREQUENCY: 2x/week   PT  DURATION: 8 weeks   PLANNED INTERVENTIONS: Therapeutic exercises, Therapeutic activity, Neuromuscular re-education, Balance training, Gait training, Patient/Family education, Joint mobilization, Stair training, DME instructions, Cryotherapy, Moist heat, Taping, Vasopneumatic device, and Manual therapy   PLAN FOR NEXT SESSION: review HEP, try Nustep , vaso, gait training manual standing as tol. , edema management      Evelene Croon, PTA 03/21/22 10:39 AM

## 2022-03-21 ENCOUNTER — Ambulatory Visit: Payer: Medicare Other

## 2022-03-21 DIAGNOSIS — Z981 Arthrodesis status: Secondary | ICD-10-CM

## 2022-03-21 DIAGNOSIS — R262 Difficulty in walking, not elsewhere classified: Secondary | ICD-10-CM | POA: Diagnosis not present

## 2022-03-21 DIAGNOSIS — R600 Localized edema: Secondary | ICD-10-CM | POA: Diagnosis not present

## 2022-03-21 DIAGNOSIS — M25672 Stiffness of left ankle, not elsewhere classified: Secondary | ICD-10-CM | POA: Diagnosis not present

## 2022-03-25 NOTE — Therapy (Unsigned)
OUTPATIENT PHYSICAL THERAPY TREATMENT NOTE  Patient Name: Michelle Bell MRN: [id number] DOB:[birth year], 46 y.o., female Today's Date: 03/25/2022   PCP/REFERRING PROVIDER: Raelyn Number, PA  END OF SESSION:     Past Medical History:  Diagnosis Date   Anxiety    Depression    Hyperlipidemia, mixed    Hypertension    Iron 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)    previously had Iron infusion's by dr Lindi Adie   Menorrhagia with regular cycle    OA (osteoarthritis)    Past Surgical History:  Procedure Laterality Date   BREAST REDUCTION SURGERY Bilateral 01/31/2021   Procedure: MAMMARY REDUCTION  (BREAST);  Surgeon: Wallace Going, DO;  Location: [place];  Service: Plastics;  Laterality: Bilateral;   CESAREAN SECTION     FUSION OF TALONAVICULAR JOINT Left 10/29/2021   Procedure: FUSION OF TALONAVICULAR JOINT;  Surgeon: Felipa Furnace, DPM;  Location: [place];  Service: Podiatry;  Laterality: Left;  GENERAL WITH BLOCK   MASS EXCISION Left 12/23/2016   Procedure: EXCISION LEFT GROIN MASS;  Surgeon: Coralie Keens, MD;  Location: [facility];  Service: General;  Laterality: Left;   SUBTALAR JOINT ARTHROEREISIS Left 10/29/2021   Procedure: SUBTALAR JOINT ARTHRODESIS;  Surgeon: Felipa Furnace, DPM;  Location: [place];  Service: Podiatry;  Laterality: Left;   Patient Active Problem List   Diagnosis Date Noted   Back pain 06/09/2020   Neck pain 06/09/2020   Symptomatic mammary hypertrophy 06/09/2020   Iron deficiency anemia 10/14/2018    REFERRING DIAG: ankle fusion  THERAPY DIAG:  No diagnosis found.  PERTINENT HISTORY: see snapshot   PRECAUTIONS: CAM boot WBAT   SUBJECTIVE: I'm hurting really bad today and I feel like my foot needs to be drained or something because it is so swollen.  PAIN:  Are you having pain? Yes: NPRS scale: 8/10 currently  Pain location: L ankle  Pain description: tight   Aggravating factors: standing too long  Relieving factors: rest,elevation   OBJECTIVE: (objective measures completed at initial evaluation unless otherwise dated)  DIAGNOSTIC FINDINGS: Radiographs: 3 views of skeletally mature adult left foot: Hardware is intact no signs of loosening or backing out noted.  Good correction alignment noted.   There are some consolidation noted.    PATIENT SURVEYS:  FOTO 29% FOTO 56% 02/19/22   COGNITION:           Overall cognitive status: Within functional limits for tasks assessed                          SENSATION: Light touch: Impaired numb medial ankle    POSTURE:  Standings with LLE abducted and ER to accommodate boot   PALPATION: Pain with palpation to L dorsum of foot and medially along scar, wrapping posteriorly to heel    LE ROM:   Active /PROM Right 12/28/2021 Left 12/28/2021 Left 02/06/22 Left  03/12/22  Hip flexion        Hip extension        Hip abduction        Hip adduction        Hip internal rotation        Hip external rotation        Knee flexion        Knee extension        Ankle dorsiflexion 10 +6 -2 AROM/+2 PROM 0 AROM  Ankle plantarflexion  16 30 AROM / 35 PROM  20 AROM  Ankle inversion 42 0/0 0 AROM/ +2 PROM   Ankle eversion 10 8/12     (Blank rows = not tested)   LE MMT:   MMT Right 12/28/2021 Left 12/28/2021 Lt 02/19/22  Hip flexion 4 4   Hip extension       Hip abduction       Hip adduction       Hip internal rotation       Hip external rotation       Knee flexion _0 Knee extension _1 Ankle dorsiflexion _2 Ankle plantarflexion 4 4- 4-  Ankle inversion 5 2- 3  Ankle eversion 5 2- 3-   (Blank rows = not tested)     FUNCTIONAL TESTS:  Sit to stand x 5 , 15 sec NO UEs   GAIT: Distance walked: 150 Assistive device utilized: Single point cane Level of assistance: Modified independence Comments: LLE abducted, external rotation of hip    [facility] Adult PT Treatment:                                                 DATE: 03/25/22 Therapeutic Exercise: *** Manual Therapy: *** Neuromuscular re-ed: *** Therapeutic Activity: *** Modalities: *** Self Care: ***   [facility] PT Treatment:                                                DATE: 03/21/2022 Therapeutic Exercise: NuStep L6 UE and LE for 6 min  Supported squats at bar 10 x 2 Heel raises double leg x 20  Blue rocker board A/P weight shifting Blue rocker board lateral weight shifting Slant board gastroc stretch 2x1' Toe raises x20 Side hip abduction 2 x 10 each  Modalities: Vaso 15 min ankle as previous coldest setting and low pressure Edema circum. 33 cm left ankle   [facility] Adult PT Treatment:                                                DATE: 03/13/22 Therapeutic Exercise: NuStep L6 UE and LE for 6 min  Supported squats at bar 10 x 2 Heel raises double leg x 20  Blue rocker board A/P weight shifting Blue rocker board lateral weight shifting Calf stretch standing Heel raises double leg x 20   SLR 2 x 10 each Side hip abduction 2 x 10 each    Modalities: Edema circum. 33 cm left ankle  Vaso 15 min ankle as previous coldest setting and low pressure  [facility] Adult PT Treatment:                                                DATE: 02/28/22 Therapeutic Exercise: NuStep L6 UE and LE for 6 min   Wall for LE strengthening: Wall slides x 10 Glute med isometric 5 sec x 5 each LE  Toe  lifts x 30 sec , heel lifts x 30 sec , alt.  Calf stretch on wall x 30 sec x 3  Small ROM hip abduction green looped TB on front of shoe x 12 each side  Sidefacing high knee march (hip flex.knee [email or web address])   Modalities: Vaso 15 min ankle as previous coldest setting and low pressure   [facility] Adult PT Treatment:                                                DATE: 02/26/2022 Therapeutic Exercise: NuStep LE L6 for 5 min  Slant board calf stretch x2'  Standing heel raise bilateral 2x10 Standing toe raises 2x10 (small range on  L) Standing hip abduction 2x10 each  Supported squat with bilateral UEs on bar 2x10 (cues to keep head and chest lifted, send hips back) Step ups 4 inch x 20 LLE  Therapeutic Activity: Gait with cane, focus on heel strike and rolling through foot x50 ft Modalities: Vaso: L ankle, 34, pt in supine, low compression   PATIENT EDUCATION:  Education details: HEP, POC  Person educated: Patient Education method: Explanation, Demonstration, and Handouts Education comprehension: verbalized understanding, returned demonstration, verbal cues required, tactile cues required, and needs further education     HOME EXERCISE PROGRAM: Access Code: [email or web address] URL: [email or web address] Date: 12/28/2021 Prepared by: Jennifer Paa   Exercises - Seated Toe Towel Scrunches  - 2-3 x daily - 7 x weekly - 2 sets - 10 reps - 5 hold - Seated Heel Raise  - 2-3 x daily - 7 x weekly - 2 sets - 10 reps - 5 hold - Supine Ankle Circles  - 2-3 x daily - 7 x weekly - 1 sets - 5 reps - 30 hold - Supine Single Leg Ankle Pumps  - 2-3 x daily - 7 x weekly - 2 sets - 10 reps - 5 hold - Seated Gastroc Stretch with Strap  - 2-3 x daily - 7 x weekly - 1 sets - 5 reps - 30 hold   ASSESSMENT:   CLINICAL IMPRESSION: Patient presents to PT with high levels of pain and complaints of swelling in her R ankle and calf. Edema measurement of Lt ankle staying around 33cm and patient advised to follow up with her PCP if she remains concerned about the swelling. She was also advised to keep up with the RICE method in the mean time. Vaso utilized this session to control pain and swelling. Session today focused on L ankle and LE strengthening as well as stretching. Patient continues to benefit from skilled PT services and should be progressed as able to improve functional independence.    OBJECTIVE IMPAIRMENTS Abnormal gait, decreased activity tolerance, decreased balance, decreased mobility, difficulty walking, decreased ROM,  decreased strength, hypomobility, increased edema, increased fascial restrictions, impaired flexibility, postural dysfunction, obesity, and pain.    ACTIVITY LIMITATIONS cleaning, community activity, meal prep, occupation, laundry, and shopping.    PERSONAL FACTORS Time since onset of injury/illness/exacerbation and 1-2 comorbidities: OA, depression  are also affecting patient's functional outcome.      REHAB POTENTIAL: Excellent   CLINICAL DECISION MAKING: Stable/uncomplicated   EVALUATION COMPLEXITY: Low     GOALS: Goals reviewed with patient? No   SHORT TERM GOALS: Target date: 01/25/2022   Pt will be able to show independence with HEP for LE strength and ROM    Baseline: Goal status: MET   2.  Pt will be able to demo increased ability to weight bear through LLE with transfers and gait  Baseline:  Goal status MET   3.  Pt will verbalize measures to reduce swelling and pain at home  Baseline:  Goal status: MET     LONG TERM GOALS: Target date: 02/22/2022   Pt will be able to walk without the cane and min noticeable limp for 500 feet in the clinic Baseline:  Status: 03/12/22: significant limp with SPC, toe out Goal status: ongoing    2.  Pt will be able to improve FOTO score to > 60% to demo improved functional mobility.  Baseline:  29% Status: 55% as of renewal (02/19/22) Goal status: ongoing    3.  Pt will be able to negotiate stairs (14 +) with 1 rail reciprocally and min pain in LLE  Baseline: unable  Status: 03/12/22: uses step to pattern  Goal status: ongoing    4.  Pt will be able to perform static balance exercises (SLS, tandem) for 30 sec at a time with light UE support Baseline: not tested Goal status: ongoing      PLAN: PT FREQUENCY: 2x/week   PT DURATION: 8 weeks   PLANNED INTERVENTIONS: Therapeutic exercises, Therapeutic activity, Neuromuscular re-education, Balance training, Gait training, Patient/Family education, Joint mobilization, Stair training,  DME instructions, Cryotherapy, Moist heat, Taping, Vasopneumatic device, and Manual therapy   PLAN FOR NEXT SESSION: review HEP, try Nustep , vaso, gait training manual standing as tol. , edema management     Raeford Razor, PT 03/25/22 8:42 AM Phone: [phone] Fax: [phone]

## 2022-03-26 ENCOUNTER — Ambulatory Visit: Payer: Medicare Other | Admitting: Physical Therapy

## 2022-03-26 ENCOUNTER — Telehealth: Payer: Self-pay | Admitting: Physical Therapy

## 2022-03-26 ENCOUNTER — Encounter: Payer: Self-pay | Admitting: Physical Therapy

## 2022-03-26 DIAGNOSIS — M25672 Stiffness of left ankle, not elsewhere classified: Secondary | ICD-10-CM

## 2022-03-26 DIAGNOSIS — R262 Difficulty in walking, not elsewhere classified: Secondary | ICD-10-CM

## 2022-03-26 DIAGNOSIS — Z981 Arthrodesis status: Secondary | ICD-10-CM | POA: Diagnosis not present

## 2022-03-26 DIAGNOSIS — R600 Localized edema: Secondary | ICD-10-CM

## 2022-03-26 NOTE — Therapy (Signed)
OUTPATIENT PHYSICAL THERAPY TREATMENT NOTE  Patient Name: Michelle Bell MRN: [id number] DOB:30-Sep-1975, 46 y.o., female Today's Date: 03/26/2022   PCP/REFERRING PROVIDER: Raelyn Number, PA  END OF SESSION:   PT End of Session - 03/26/22 1130     Visit Number 12    Number of Visits 16    Date for PT Re-Evaluation 04/02/22    Authorization Type MCR/MCD    PT Start Time 1130    PT Stop Time 1215   including 15 min vaso   PT Time Calculation (min) 45 min    Activity Tolerance Patient tolerated treatment well    Behavior During Therapy WFL for tasks assessed/performed             Past Medical History:  Diagnosis Date   Anxiety    Depression    Hyperlipidemia, mixed    Hypertension    Iron 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)    previously had Iron infusion's by dr Lindi Adie   Menorrhagia with regular cycle    OA (osteoarthritis)    Past Surgical History:  Procedure Laterality Date   BREAST REDUCTION SURGERY Bilateral 01/31/2021   Procedure: MAMMARY REDUCTION  (BREAST);  Surgeon: Wallace Going, DO;  Location: [place];  Service: Plastics;  Laterality: Bilateral;   CESAREAN SECTION     FUSION OF TALONAVICULAR JOINT Left 10/29/2021   Procedure: FUSION OF TALONAVICULAR JOINT;  Surgeon: Felipa Furnace, DPM;  Location: [facility];  Service: Podiatry;  Laterality: Left;  GENERAL WITH BLOCK   MASS EXCISION Left 12/23/2016   Procedure: EXCISION LEFT GROIN MASS;  Surgeon: Coralie Keens, MD;  Location: [facility];  Service: General;  Laterality: Left;   SUBTALAR JOINT ARTHROEREISIS Left 10/29/2021   Procedure: SUBTALAR JOINT ARTHRODESIS;  Surgeon: Felipa Furnace, DPM;  Location: [place];  Service: Podiatry;  Laterality: Left;   Patient Active Problem List   Diagnosis Date Noted   Back pain 06/09/2020   Neck pain 06/09/2020   Symptomatic mammary hypertrophy 06/09/2020   Iron deficiency anemia 10/14/2018     REFERRING DIAG: ankle fusion  THERAPY DIAG:  Localized edema  Difficulty in walking, not elsewhere classified  Stiffness of left ankle, not elsewhere classified  PERTINENT HISTORY: see snapshot   PRECAUTIONS: CAM boot WBAT   SUBJECTIVE: Pt reports that her ankle is doing a bit better than last session with less pain.  PAIN:  Are you having pain? Yes: NPRS scale: 7/10 currently  Pain location: L ankle  Pain description: tight  Aggravating factors: standing too long  Relieving factors: rest,elevation   OBJECTIVE: (objective measures completed at initial evaluation unless otherwise dated)  DIAGNOSTIC FINDINGS: Radiographs: 3 views of skeletally mature adult left foot: Hardware is intact no signs of loosening or backing out noted.  Good correction alignment noted.   There are some consolidation noted.    PATIENT SURVEYS:  FOTO 29% FOTO 56% 02/19/22   COGNITION:           Overall cognitive status: Within functional limits for tasks assessed                          SENSATION: Light touch: Impaired numb medial ankle    POSTURE:  Standings with LLE abducted and ER to accommodate boot   PALPATION: Pain with palpation to L dorsum of foot and medially along scar, wrapping posteriorly to heel    LE ROM:  Active /PROM Right 12/28/2021 Left 12/28/2021 Left 02/06/22 Left  03/12/22  Hip flexion        Hip extension        Hip abduction        Hip adduction        Hip internal rotation        Hip external rotation        Knee flexion        Knee extension        Ankle dorsiflexion 10 +6 -2 AROM/+2 PROM 0 AROM  Ankle plantarflexion   16 30 AROM / 35 PROM  20 AROM  Ankle inversion 42 0/0 0 AROM/ +2 PROM   Ankle eversion 10 8/12     (Blank rows = not tested)   LE MMT:   MMT Right 12/28/2021 Left 12/28/2021 Lt 02/19/22  Hip flexion 4 4   Hip extension       Hip abduction       Hip adduction       Hip internal rotation       Hip external rotation       Knee  flexion 5 5 5   Knee extension 5 5 5   Ankle dorsiflexion 5 5 5   Ankle plantarflexion 4 4- 4-  Ankle inversion 5 2- 3  Ankle eversion 5 2- 3-   (Blank rows = not tested)     FUNCTIONAL TESTS:  Sit to stand x 5 , 15 sec NO UEs   GAIT: Distance walked: 150 Assistive device utilized: Single point cane Level of assistance: Modified independence Comments: LLE abducted, external rotation of hip   [facility] Adult PT Treatment:                                                DATE: 03/26/2022 Therapeutic Exercise: NuStep L6 UE and LE for 6 min  Slant board stretch - 3x 45'' Supported squats at bar 10 x 2 Heel raises double leg x 20  On 2'' step 2'' step down 2x Blue rocker board A/P weight shifting Blue rocker board lateral weight shifting Toe raises x20 Alternating standing hip abduction 2 x 10 each with GTB at knees Modalities: Vaso 15 min ankle as previous coldest setting and low pressure Edema circum. 31 cm left ankle   [facility] Adult PT Treatment:                                                DATE: 03/21/2022 Therapeutic Exercise: NuStep L6 UE and LE for 6 min  Supported squats at bar 10 x 2 Heel raises double leg x 20  Blue rocker board A/P weight shifting Blue rocker board lateral weight shifting Slant board gastroc stretch 2x1' Toe raises x20 Side hip abduction 2 x 10 each  Modalities: Vaso 15 min ankle as previous coldest setting and low pressure Edema circum. 33 cm left ankle   [facility] Adult PT Treatment:                                                DATE: 03/13/22 Therapeutic Exercise:  NuStep L6 UE and LE for 6 min  Supported squats at bar 10 x 2 Heel raises double leg x 20  Blue rocker board A/P weight shifting Blue rocker board lateral weight shifting Calf stretch standing Heel raises double leg x 20   SLR 2 x 10 each Side hip abduction 2 x 10 each    Modalities: Edema circum. 33 cm left ankle  Vaso 15 min ankle as previous coldest setting and low pressure  [facility]  Adult PT Treatment:                                                DATE: 02/28/22 Therapeutic Exercise: NuStep L6 UE and LE for 6 min   Wall for LE strengthening: Wall slides x 10 Glute med isometric 5 sec x 5 each LE  Toe lifts x 30 sec , heel lifts x 30 sec , alt.  Calf stretch on wall x 30 sec x 3  Small ROM hip abduction green looped TB on front of shoe x 12 each side  Sidefacing high knee march (hip flex.knee flex.DF)   Modalities: Vaso 15 min ankle as previous coldest setting and low pressure   [facility] Adult PT Treatment:                                                DATE: 02/26/2022 Therapeutic Exercise: NuStep LE L6 for 5 min  Slant board calf stretch x2'  Standing heel raise bilateral 2x10 Standing toe raises 2x10 (small range on L) Standing hip abduction 2x10 each  Supported squat with bilateral UEs on bar 2x10 (cues to keep head and chest lifted, send hips back) Step ups 4 inch x 20 LLE  Therapeutic Activity: Gait with cane, focus on heel strike and rolling through foot x50 ft Modalities: Vaso: L ankle, 34, pt in supine, low compression   PATIENT EDUCATION:  Education details: HEP, POC  Person educated: Patient Education method: Explanation, Demonstration, and Handouts Education comprehension: verbalized understanding, returned demonstration, verbal cues required, tactile cues required, and needs further education     HOME EXERCISE PROGRAM: Access Code: [email or web address] URL: [email or web address] Date: 12/28/2021 Prepared by: Raeford Razor   Exercises - Seated Toe Towel Scrunches  - 2-3 x daily - 7 x weekly - 2 sets - 10 reps - 5 hold - Seated Heel Raise  - 2-3 x daily - 7 x weekly - 2 sets - 10 reps - 5 hold - Supine Ankle Circles  - 2-3 x daily - 7 x weekly - 1 sets - 5 reps - 30 hold - Supine Single Leg Ankle Pumps  - 2-3 x daily - 7 x weekly - 2 sets - 10 reps - 5 hold - Seated Gastroc Stretch with Strap  - 2-3 x daily - 7 x weekly - 1 sets - 5 reps -  30 hold   ASSESSMENT:   CLINICAL IMPRESSION: Cherrell tolerated session with no significant adverse reaction.  She continue to have high levels of pain.  She demonstrates limited DF with ER compensation during steps downs which is partially corrected with cuing.  Pt reports pain relief following vaso.   OBJECTIVE IMPAIRMENTS Abnormal gait, decreased activity tolerance, decreased balance, decreased mobility, difficulty  walking, decreased ROM, decreased strength, hypomobility, increased edema, increased fascial restrictions, impaired flexibility, postural dysfunction, obesity, and pain.    ACTIVITY LIMITATIONS cleaning, community activity, meal prep, occupation, laundry, and shopping.    PERSONAL FACTORS Time since onset of injury/illness/exacerbation and 1-2 comorbidities: OA, depression  are also affecting patient's functional outcome.      REHAB POTENTIAL: Excellent   CLINICAL DECISION MAKING: Stable/uncomplicated   EVALUATION COMPLEXITY: Low     GOALS: Goals reviewed with patient? No   SHORT TERM GOALS: Target date: 01/25/2022   Pt will be able to show independence with HEP for LE strength and ROM  Baseline: Goal status: MET   2.  Pt will be able to demo increased ability to weight bear through LLE with transfers and gait  Baseline:  Goal status MET   3.  Pt will verbalize measures to reduce swelling and pain at home  Baseline:  Goal status: MET     LONG TERM GOALS: Target date: 02/22/2022   Pt will be able to walk without the cane and min noticeable limp for 500 feet in the clinic Baseline:  Status: 03/12/22: significant limp with SPC, toe out Goal status: ongoing    2.  Pt will be able to improve FOTO score to > 60% to demo improved functional mobility.  Baseline:  29% Status: 55% as of renewal (02/19/22) Goal status: ongoing    3.  Pt will be able to negotiate stairs (14 +) with 1 rail reciprocally and min pain in LLE  Baseline: unable  Status: 03/12/22: uses step  to pattern  Goal status: ongoing    4.  Pt will be able to perform static balance exercises (SLS, tandem) for 30 sec at a time with light UE support Baseline: not tested Goal status: ongoing      PLAN: PT FREQUENCY: 2x/week   PT DURATION: 8 weeks   PLANNED INTERVENTIONS: Therapeutic exercises, Therapeutic activity, Neuromuscular re-education, Balance training, Gait training, Patient/Family education, Joint mobilization, Stair training, DME instructions, Cryotherapy, Moist heat, Taping, Vasopneumatic device, and Manual therapy   PLAN FOR NEXT SESSION: review HEP, try Nustep , vaso, gait training manual standing as tol. , edema management      Kevan Ny Ramina Hulet PT 03/26/22 12:09 PM

## 2022-03-26 NOTE — Telephone Encounter (Signed)
Patient was a no show for her appt this AM.  I was not able to leave a voicemail.  She has an appt 03/28/22.  Raeford Razor, PT 03/26/22 10:35 AM Phone: [phone] Fax: [phone]

## 2022-03-27 DIAGNOSIS — F411 Generalized anxiety disorder: Secondary | ICD-10-CM | POA: Diagnosis not present

## 2022-03-27 DIAGNOSIS — F330 Major depressive disorder, recurrent, mild: Secondary | ICD-10-CM | POA: Diagnosis not present

## 2022-03-28 ENCOUNTER — Ambulatory Visit: Payer: Medicare Other

## 2022-04-02 ENCOUNTER — Ambulatory Visit: Payer: Medicare Other | Attending: Podiatry

## 2022-04-02 DIAGNOSIS — R262 Difficulty in walking, not elsewhere classified: Secondary | ICD-10-CM | POA: Insufficient documentation

## 2022-04-02 DIAGNOSIS — R600 Localized edema: Secondary | ICD-10-CM | POA: Insufficient documentation

## 2022-04-02 DIAGNOSIS — M25672 Stiffness of left ankle, not elsewhere classified: Secondary | ICD-10-CM | POA: Diagnosis not present

## 2022-04-02 DIAGNOSIS — Z981 Arthrodesis status: Secondary | ICD-10-CM | POA: Diagnosis not present

## 2022-04-02 NOTE — Therapy (Signed)
OUTPATIENT PHYSICAL THERAPY TREATMENT NOTE  Patient Name: Michelle Bell MRN: [id number] DOB:July 22, 1976, 46 y.o., female Today's Date: 04/02/2022   PCP/REFERRING PROVIDER: Raelyn Number, PA  END OF SESSION:   PT End of Session - 04/02/22 1030     Visit Number 13    Number of Visits 16    Date for PT Re-Evaluation 04/02/22    Authorization Type MCR/MCD    PT Start Time 1040    PT Stop Time 1135    PT Time Calculation (min) 55 min    Activity Tolerance Patient tolerated treatment well    Behavior During Therapy WFL for tasks assessed/performed              Past Medical History:  Diagnosis Date   Anxiety    Depression    Hyperlipidemia, mixed    Hypertension    Iron 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)    previously had Iron infusion's by dr Lindi Adie   Menorrhagia with regular cycle    OA (osteoarthritis)    Past Surgical History:  Procedure Laterality Date   BREAST REDUCTION SURGERY Bilateral 01/31/2021   Procedure: MAMMARY REDUCTION  (BREAST);  Surgeon: Wallace Going, DO;  Location: [place];  Service: Plastics;  Laterality: Bilateral;   CESAREAN SECTION     FUSION OF TALONAVICULAR JOINT Left 10/29/2021   Procedure: FUSION OF TALONAVICULAR JOINT;  Surgeon: Felipa Furnace, DPM;  Location: [place];  Service: Podiatry;  Laterality: Left;  GENERAL WITH BLOCK   MASS EXCISION Left 12/23/2016   Procedure: EXCISION LEFT GROIN MASS;  Surgeon: Coralie Keens, MD;  Location: [facility];  Service: General;  Laterality: Left;   SUBTALAR JOINT ARTHROEREISIS Left 10/29/2021   Procedure: SUBTALAR JOINT ARTHRODESIS;  Surgeon: Felipa Furnace, DPM;  Location: [place];  Service: Podiatry;  Laterality: Left;   Patient Active Problem List   Diagnosis Date Noted   Back pain 06/09/2020   Neck pain 06/09/2020   Symptomatic mammary hypertrophy 06/09/2020   Iron deficiency anemia 10/14/2018    REFERRING DIAG: ankle  fusion  THERAPY DIAG:  Localized edema  Difficulty in walking, not elsewhere classified  Stiffness of left ankle, not elsewhere classified  Status post ankle fusion  PERTINENT HISTORY: see snapshot   PRECAUTIONS: CAM boot WBAT   SUBJECTIVE: Patient reports stiffness and increased pain in her ankle this session  PAIN:  Are you having pain? Yes: NPRS scale: 8/10 currently  Pain location: L ankle  Pain description: tight  Aggravating factors: standing too long  Relieving factors: rest,elevation   OBJECTIVE: (objective measures completed at initial evaluation unless otherwise dated)  DIAGNOSTIC FINDINGS: Radiographs: 3 views of skeletally mature adult left foot: Hardware is intact no signs of loosening or backing out noted.  Good correction alignment noted.   There are some consolidation noted.    PATIENT SURVEYS:  FOTO 29% FOTO 56% 02/19/22   COGNITION:           Overall cognitive status: Within functional limits for tasks assessed                          SENSATION: Light touch: Impaired numb medial ankle    POSTURE:  Standings with LLE abducted and ER to accommodate boot   PALPATION: Pain with palpation to L dorsum of foot and medially along scar, wrapping posteriorly to heel    LE ROM:   Active /PROM Right 12/28/2021  Left 12/28/2021 Left 02/06/22 Left  03/12/22  Hip flexion        Hip extension        Hip abduction        Hip adduction        Hip internal rotation        Hip external rotation        Knee flexion        Knee extension        Ankle dorsiflexion 10 +6 -2 AROM/+2 PROM 0 AROM  Ankle plantarflexion   16 30 AROM / 35 PROM  20 AROM  Ankle inversion 42 0/0 0 AROM/ +2 PROM   Ankle eversion 10 8/12     (Blank rows = not tested)   LE MMT:   MMT Right 12/28/2021 Left 12/28/2021 Lt 02/19/22  Hip flexion 4 4   Hip extension       Hip abduction       Hip adduction       Hip internal rotation       Hip external rotation       Knee flexion 5 5 5    Knee extension 5 5 5   Ankle dorsiflexion 5 5 5   Ankle plantarflexion 4 4- 4-  Ankle inversion 5 2- 3  Ankle eversion 5 2- 3-   (Blank rows = not tested)     FUNCTIONAL TESTS:  Sit to stand x 5 , 15 sec NO UEs   GAIT: Distance walked: 150 Assistive device utilized: Single point cane Level of assistance: Modified independence Comments: LLE abducted, external rotation of hip   [facility] Adult PT Treatment:                                                DATE: 8/1//2023 Therapeutic Exercise: NuStep L6 UE and LE for 6 min  Slant board stretch - 3x 45'' Supported squats at bar 10 x 2 Heel raises double leg x 20  On 2'' step Blue rocker board A/P weight shifting x1' Blue rocker board lateral weight shifting x1' Toe raises x20 Alternating standing hip abduction 2 x 10 each with GTB at knees Side stepping at counter RTB at ankles x2 laps NMRE: Tandem stance x30" BIL (intermittent UE support with L in back) Romberg stance on Airex 2x30" EO Modalities: Vaso 15 min ankle as previous coldest setting and low pressure Edema circum. 33 cm left ankle   [facility] Adult PT Treatment:                                                DATE: 03/26/2022 Therapeutic Exercise: NuStep L6 UE and LE for 6 min  Slant board stretch - 3x 45'' Supported squats at bar 10 x 2 Heel raises double leg x 20  On 2'' step 2'' step down 2x Blue rocker board A/P weight shifting Blue rocker board lateral weight shifting Toe raises x20 Alternating standing hip abduction 2 x 10 each with GTB at knees Modalities: Vaso 15 min ankle as previous coldest setting and low pressure Edema circum. 31 cm left ankle   [facility] Adult PT Treatment:  DATE: 03/21/2022 Therapeutic Exercise: NuStep L6 UE and LE for 6 min  Supported squats at bar 10 x 2 Heel raises double leg x 20  Blue rocker board A/P weight shifting Blue rocker board lateral weight shifting Slant board gastroc stretch  2x1' Toe raises x20 Side hip abduction 2 x 10 each  Modalities: Vaso 15 min ankle as previous coldest setting and low pressure Edema circum. 33 cm left ankle    PATIENT EDUCATION:  Education details: HEP, POC  Person educated: Patient Education method: Explanation, Demonstration, and Handouts Education comprehension: verbalized understanding, returned demonstration, verbal cues required, tactile cues required, and needs further education     HOME EXERCISE PROGRAM: Access Code: [email or web address] URL: [email or web address] Date: 12/28/2021 Prepared by: Raeford Razor   Exercises - Seated Toe Towel Scrunches  - 2-3 x daily - 7 x weekly - 2 sets - 10 reps - 5 hold - Seated Heel Raise  - 2-3 x daily - 7 x weekly - 2 sets - 10 reps - 5 hold - Supine Ankle Circles  - 2-3 x daily - 7 x weekly - 1 sets - 5 reps - 30 hold - Supine Single Leg Ankle Pumps  - 2-3 x daily - 7 x weekly - 2 sets - 10 reps - 5 hold - Seated Gastroc Stretch with Strap  - 2-3 x daily - 7 x weekly - 1 sets - 5 reps - 30 hold   ASSESSMENT:   CLINICAL IMPRESSION: Patient presents to PT with continued ankle stiffness, pain, and swelling. Session today continued to focus on ankle stretching and strengthening as well as balance training. She had difficulty with tandem stance with L foot in back, requiring intermittent UE support. Patient was able to tolerate all prescribed exercises with no adverse effects. Patient continues to benefit from skilled PT services and should be progressed as able to improve functional independence.    OBJECTIVE IMPAIRMENTS Abnormal gait, decreased activity tolerance, decreased balance, decreased mobility, difficulty walking, decreased ROM, decreased strength, hypomobility, increased edema, increased fascial restrictions, impaired flexibility, postural dysfunction, obesity, and pain.    ACTIVITY LIMITATIONS cleaning, community activity, meal prep, occupation, laundry, and shopping.     PERSONAL FACTORS Time since onset of injury/illness/exacerbation and 1-2 comorbidities: OA, depression  are also affecting patient's functional outcome.      REHAB POTENTIAL: Excellent   CLINICAL DECISION MAKING: Stable/uncomplicated   EVALUATION COMPLEXITY: Low     GOALS: Goals reviewed with patient? No   SHORT TERM GOALS: Target date: 01/25/2022   Pt will be able to show independence with HEP for LE strength and ROM  Baseline: Goal status: MET   2.  Pt will be able to demo increased ability to weight bear through LLE with transfers and gait  Baseline:  Goal status MET   3.  Pt will verbalize measures to reduce swelling and pain at home  Baseline:  Goal status: MET     LONG TERM GOALS: Target date: 02/22/2022   Pt will be able to walk without the cane and min noticeable limp for 500 feet in the clinic Baseline:  Status: 03/12/22: significant limp with SPC, toe out Goal status: ongoing    2.  Pt will be able to improve FOTO score to > 60% to demo improved functional mobility.  Baseline:  29% Status: 55% as of renewal (02/19/22) Goal status: ongoing    3.  Pt will be able to negotiate stairs (14 +) with 1 rail reciprocally and min pain in  LLE  Baseline: unable  Status: 03/12/22: uses step to pattern  Goal status: ongoing    4.  Pt will be able to perform static balance exercises (SLS, tandem) for 30 sec at a time with light UE support Baseline: not tested Goal status: ongoing      PLAN: PT FREQUENCY: 2x/week   PT DURATION: 8 weeks   PLANNED INTERVENTIONS: Therapeutic exercises, Therapeutic activity, Neuromuscular re-education, Balance training, Gait training, Patient/Family education, Joint mobilization, Stair training, DME instructions, Cryotherapy, Moist heat, Taping, Vasopneumatic device, and Manual therapy   PLAN FOR NEXT SESSION: review HEP, try Nustep , vaso, gait training manual standing as tol. , edema management      Evelene Croon PTA 04/02/22  11:21 AM

## 2022-04-04 ENCOUNTER — Ambulatory Visit: Payer: Medicare Other | Admitting: Physical Therapy

## 2022-04-04 DIAGNOSIS — R262 Difficulty in walking, not elsewhere classified: Secondary | ICD-10-CM | POA: Diagnosis not present

## 2022-04-04 DIAGNOSIS — R600 Localized edema: Secondary | ICD-10-CM

## 2022-04-04 DIAGNOSIS — Z981 Arthrodesis status: Secondary | ICD-10-CM | POA: Diagnosis not present

## 2022-04-04 DIAGNOSIS — M25672 Stiffness of left ankle, not elsewhere classified: Secondary | ICD-10-CM | POA: Diagnosis not present

## 2022-04-04 NOTE — Therapy (Signed)
OUTPATIENT PHYSICAL THERAPY TREATMENT NOTE RENEWAL  Patient Name: Michelle Bell MRN: [id number] DOB:03-Jun-1976, 46 y.o., female Today's Date: 04/04/2022   PCP/REFERRING PROVIDER: Raelyn Number, PA  END OF SESSION:   PT End of Session - 04/04/22 1023     Visit Number 14    Number of Visits 18    Date for PT Re-Evaluation 05/02/22    Authorization Type MCR/MCD    PT Start Time 1017    PT Stop Time 1120    PT Time Calculation (min) 63 min    Activity Tolerance Patient tolerated treatment well    Behavior During Therapy WFL for tasks assessed/performed               Past Medical History:  Diagnosis Date   Anxiety    Depression    Hyperlipidemia, mixed    Hypertension    Iron 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)    previously had Iron infusion's by dr Lindi Adie   Menorrhagia with regular cycle    OA (osteoarthritis)    Past Surgical History:  Procedure Laterality Date   BREAST REDUCTION SURGERY Bilateral 01/31/2021   Procedure: MAMMARY REDUCTION  (BREAST);  Surgeon: Wallace Going, DO;  Location: [place];  Service: Plastics;  Laterality: Bilateral;   CESAREAN SECTION     FUSION OF TALONAVICULAR JOINT Left 10/29/2021   Procedure: FUSION OF TALONAVICULAR JOINT;  Surgeon: Felipa Furnace, DPM;  Location: [facility];  Service: Podiatry;  Laterality: Left;  GENERAL WITH BLOCK   MASS EXCISION Left 12/23/2016   Procedure: EXCISION LEFT GROIN MASS;  Surgeon: Coralie Keens, MD;  Location: [facility];  Service: General;  Laterality: Left;   SUBTALAR JOINT ARTHROEREISIS Left 10/29/2021   Procedure: SUBTALAR JOINT ARTHRODESIS;  Surgeon: Felipa Furnace, DPM;  Location: [place];  Service: Podiatry;  Laterality: Left;   Patient Active Problem List   Diagnosis Date Noted   Back pain 06/09/2020   Neck pain 06/09/2020   Symptomatic mammary hypertrophy 06/09/2020   Iron deficiency anemia 10/14/2018    REFERRING  DIAG: ankle fusion  THERAPY DIAG:  Localized edema  Difficulty in walking, not elsewhere classified  Stiffness of left ankle, not elsewhere classified  Status post ankle fusion  PERTINENT HISTORY: see snapshot   PRECAUTIONS: CAM boot WBAT   SUBJECTIVE: Patient is struggling with walking.  When I stand I have to give myself time.  Its heavy.  Pain about 3/10 and it stays the same not matter what.   PAIN:  Are you having pain? Yes:  NPRS scale: 3/10  Pain location: L ankle  Pain description: tight  Aggravating factors: standing too long  Relieving factors: rest,elevation   OBJECTIVE: (objective measures completed at initial evaluation unless otherwise dated)  DIAGNOSTIC FINDINGS: Radiographs: 3 views of skeletally mature adult left foot: Hardware is intact no signs of loosening or backing out noted.  Good correction alignment noted.   There are some consolidation noted.    PATIENT SURVEYS:  FOTO 29% FOTO 56% 02/19/22   COGNITION:           Overall cognitive status: Within functional limits for tasks assessed                          SENSATION: Light touch: Impaired numb medial ankle    POSTURE:  Standings with LLE abducted and ER to accommodate boot   PALPATION: Pain with palpation to L  dorsum of foot and medially along scar, wrapping posteriorly to heel    LE ROM:   Active /PROM Right 12/28/2021 Left 12/28/2021 Left 02/06/22 Left  03/12/22 Lt  04/04/22  Hip flexion         Hip extension         Hip abduction         Hip adduction         Hip internal rotation         Hip external rotation         Knee flexion         Knee extension         Ankle dorsiflexion 10 +6 -2 AROM/+2 PROM 0 AROM 0 AROM  Ankle plantarflexion   16 30 AROM / 35 PROM  20 AROM 28 AROM  Ankle inversion 42 0/0 0 AROM/ +2 PROM  0  Ankle eversion 10 8/12   Sits in 15 deg Eversion.  PROM to 25 deg    (Blank rows = not tested)   LE MMT:   MMT Right 12/28/2021 Left 12/28/2021 Lt 02/19/22 Lt.   04/04/22  Hip flexion 4 4    Hip extension        Hip abduction        Hip adduction        Hip internal rotation        Hip external rotation        Knee flexion _0 Knee extension _1 Ankle dorsiflexion _2 Ankle plantarflexion 4 4- 4- 4-  Ankle inversion 5 2- 3 3+  Ankle eversion 5 2- 3- 3   (Blank rows = not tested)     FUNCTIONAL TESTS:  Sit to stand x 5 , 15 sec NO UEs   GAIT: Distance walked: 150 Assistive device utilized: Single point cane Level of assistance: Modified independence Comments: LLE abducted, external rotation of hip    [facility] Adult PT Treatment:                                                DATE: 04/04/22 Therapeutic Exercise: NuStep L 6 UE and LE for 6 min Ankle AROM, circles, pumps Leg press 2.5 plates double leg x 20, then 3.5 plates x 20 Lt. LE 2 x 10 1 plate  Manual Therapy: Edema control with elevation Retromassage  KT tape 3 fans for edema  Modalities: Vasopneumatic 15 min with elevation , coldest setting and mod compression Self Care: POC, FOTO  Circumference 23 inches figure 8 pre manual down to 22 inches   [facility] Adult PT Treatment:                                                DATE: 8/1//2023 Therapeutic Exercise: NuStep L6 UE and LE for 6 min  Slant board stretch - 3x 45'' Supported squats at bar 10 x 2 Heel raises double leg x 20  On 2'' step Blue rocker board A/P weight shifting x1' Blue rocker board lateral weight shifting x1' Toe raises x20 Alternating standing hip abduction 2 x 10 each with GTB at knees Side stepping at counter RTB at ankles x2  laps NMRE: Tandem stance x30" BIL (intermittent UE support with L in back) Romberg stance on Airex 2x30" EO Modalities: Vaso 15 min ankle as previous coldest setting and low pressure Edema circum. 33 cm left ankle   [facility] Adult PT Treatment:                                                DATE: 03/26/2022 Therapeutic Exercise: NuStep L6 UE and LE for 6 min  Slant board  stretch - 3x 45'' Supported squats at bar 10 x 2 Heel raises double leg x 20  On 2'' step 2'' step down 2x Blue rocker board A/P weight shifting Blue rocker board lateral weight shifting Toe raises x20 Alternating standing hip abduction 2 x 10 each with GTB at knees Modalities: Vaso 15 min ankle as previous coldest setting and low pressure Edema circum. 31 cm left ankle   [facility] Adult PT Treatment:                                                DATE: 03/21/2022 Therapeutic Exercise: NuStep L6 UE and LE for 6 min  Supported squats at bar 10 x 2 Heel raises double leg x 20  Blue rocker board A/P weight shifting Blue rocker board lateral weight shifting Slant board gastroc stretch 2x1' Toe raises x20 Side hip abduction 2 x 10 each  Modalities: Vaso 15 min ankle as previous coldest setting and low pressure Edema circum. 33 cm left ankle    PATIENT EDUCATION:  Education details: HEP, POC  Person educated: Patient Education method: Explanation, Demonstration, and Handouts Education comprehension: verbalized understanding, returned demonstration, verbal cues required, tactile cues required, and needs further education     HOME EXERCISE PROGRAM: Access Code: [email or web address] URL: [email or web address] Date: 12/28/2021 Prepared by: Raeford Razor   Exercises - Seated Toe Towel Scrunches  - 2-3 x daily - 7 x weekly - 2 sets - 10 reps - 5 hold - Seated Heel Raise  - 2-3 x daily - 7 x weekly - 2 sets - 10 reps - 5 hold - Supine Ankle Circles  - 2-3 x daily - 7 x weekly - 1 sets - 5 reps - 30 hold - Supine Single Leg Ankle Pumps  - 2-3 x daily - 7 x weekly - 2 sets - 10 reps - 5 hold - Seated Gastroc Stretch with Strap  - 2-3 x daily - 7 x weekly - 1 sets - 5 reps - 30 hold   ASSESSMENT:   CLINICAL IMPRESSION: Patient has 14 completed visits for L ankle.  Her pain and function is improved but she continues to have difficulty walking and standing > 30 min .  She has not gained  AROM of her ankle but strength is somewhat improved.  She has significant edema which contributes to her abnormal gait pattern and lack of AROM. She will be seen for 4 more weeks just until she sees the MD.  She plans to see if Dr. Posey Pronto can drain her ankle/  Recommended she wear compression sock.    OBJECTIVE IMPAIRMENTS Abnormal gait, decreased activity tolerance, decreased balance, decreased mobility, difficulty walking, decreased ROM, decreased strength, hypomobility, increased edema, increased fascial restrictions, impaired flexibility,  postural dysfunction, obesity, and pain.    ACTIVITY LIMITATIONS cleaning, community activity, meal prep, occupation, laundry, and shopping.    PERSONAL FACTORS Time since onset of injury/illness/exacerbation and 1-2 comorbidities: OA, depression  are also affecting patient's functional outcome.      REHAB POTENTIAL: Excellent   CLINICAL DECISION MAKING: Stable/uncomplicated   EVALUATION COMPLEXITY: Low     GOALS: Goals reviewed with patient? No   SHORT TERM GOALS: Target date: 01/25/2022   Pt will be able to show independence with HEP for LE strength and ROM  Baseline: Goal status: MET   2.  Pt will be able to demo increased ability to weight bear through LLE with transfers and gait  Baseline:  Goal status MET   3.  Pt will verbalize measures to reduce swelling and pain at home  Baseline:  Goal status: MET     LONG TERM GOALS: Target date: 02/22/2022   Pt will be able to walk without the cane and min noticeable limp for 500 feet in the clinic Baseline:  Status: 03/12/22: significant limp with SPC, toe out.  Needs cane longer distances, does try to go without as best she can to challenge herself  Goal status: ongoing    2.  Pt will be able to improve FOTO score to > 60% to demo improved functional mobility.  Baseline:  29% Status: 55% as of renewal (02/19/22). 04/04/22 :56%  Goal status: ongoing    3.  Pt will be able to negotiate stairs  (14 +) with 1 rail reciprocally and min pain in LLE  Baseline: unable  Status: 04/04/22: recently moved to apt with no stairs Goal status: ongoing    4.  Pt will be able to perform static balance exercises (SLS, tandem) for 30 sec at a time with light UE support Baseline: not tested Goal status: ongoing      PLAN: PT FREQUENCY: 2x/week   PT DURATION: 8 weeks   PLANNED INTERVENTIONS: Therapeutic exercises, Therapeutic activity, Neuromuscular re-education, Balance training, Gait training, Patient/Family education, Joint mobilization, Stair training, DME instructions, Cryotherapy, Moist heat, Taping, Vasopneumatic device, and Manual therapy   PLAN FOR NEXT SESSION: add to HEP, manual ,  edema management . CKC as able     Raeford Razor, PT 04/04/22 11:46 AM Phone: [phone] Fax: [phone]  Stephani Police Taejon Irani PT 04/04/22 11:34 AM

## 2022-04-08 NOTE — Therapy (Unsigned)
OUTPATIENT PHYSICAL THERAPY TREATMENT NOTE  Patient Name: Michelle Bell MRN: [id number] DOB:July 05, 1976, 46 y.o., female Today's Date: 04/09/2022   PCP/REFERRING PROVIDER: Raelyn Number, PA  END OF SESSION:   PT End of Session - 04/09/22 1028     Visit Number 15    Number of Visits 18    Date for PT Re-Evaluation 05/02/22    Authorization Type MCR/MCD    PT Start Time 1022    PT Stop Time 1100    PT Time Calculation (min) 38 min    Activity Tolerance Patient tolerated treatment well    Behavior During Therapy WFL for tasks assessed/performed                Past Medical History:  Diagnosis Date   Anxiety    Depression    Hyperlipidemia, mixed    Hypertension    Iron 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)    previously had Iron infusion's by dr Lindi Adie   Menorrhagia with regular cycle    OA (osteoarthritis)    Past Surgical History:  Procedure Laterality Date   BREAST REDUCTION SURGERY Bilateral 01/31/2021   Procedure: MAMMARY REDUCTION  (BREAST);  Surgeon: Wallace Going, DO;  Location: [facility];  Service: Plastics;  Laterality: Bilateral;   CESAREAN SECTION     FUSION OF TALONAVICULAR JOINT Left 10/29/2021   Procedure: FUSION OF TALONAVICULAR JOINT;  Surgeon: Felipa Furnace, DPM;  Location: [place];  Service: Podiatry;  Laterality: Left;  GENERAL WITH BLOCK   MASS EXCISION Left 12/23/2016   Procedure: EXCISION LEFT GROIN MASS;  Surgeon: Coralie Keens, MD;  Location: [facility];  Service: General;  Laterality: Left;   SUBTALAR JOINT ARTHROEREISIS Left 10/29/2021   Procedure: SUBTALAR JOINT ARTHRODESIS;  Surgeon: Felipa Furnace, DPM;  Location: [place];  Service: Podiatry;  Laterality: Left;   Patient Active Problem List   Diagnosis Date Noted   Back pain 06/09/2020   Neck pain 06/09/2020   Symptomatic mammary hypertrophy 06/09/2020   Iron deficiency anemia 10/14/2018    REFERRING DIAG:  ankle fusion  THERAPY DIAG:  Localized edema  Difficulty in walking, not elsewhere classified  Stiffness of left ankle, not elsewhere classified  Status post ankle fusion  PERTINENT HISTORY: see snapshot   PRECAUTIONS: CAM boot WBAT   SUBJECTIVE: Patient is struggling with walking.  When I stand I have to give myself time.  Its heavy.  Pain about 3/10 and it stays the same not matter what.   PAIN:  Are you having pain? Yes:  NPRS scale: 3/10  Pain location: L ankle  Pain description: tight  Aggravating factors: standing too long  Relieving factors: rest,elevation   OBJECTIVE: (objective measures completed at initial evaluation unless otherwise dated)  DIAGNOSTIC FINDINGS: Radiographs: 3 views of skeletally mature adult left foot: Hardware is intact no signs of loosening or backing out noted.  Good correction alignment noted.   There are some consolidation noted.    PATIENT SURVEYS:  FOTO 29% FOTO 56% 02/19/22   COGNITION:           Overall cognitive status: Within functional limits for tasks assessed                          SENSATION: Light touch: Impaired numb medial ankle    POSTURE:  Standings with LLE abducted and ER to accommodate boot   PALPATION: Pain with palpation to L  dorsum of foot and medially along scar, wrapping posteriorly to heel    LE ROM:   Active /PROM Right 12/28/2021 Left 12/28/2021 Left 02/06/22 Left  03/12/22 Lt  04/04/22  Hip flexion         Hip extension         Hip abduction         Hip adduction         Hip internal rotation         Hip external rotation         Knee flexion         Knee extension         Ankle dorsiflexion 10 +6 -2 AROM/+2 PROM 0 AROM 0 AROM  Ankle plantarflexion   16 30 AROM / 35 PROM  20 AROM 28 AROM  Ankle inversion 42 0/0 0 AROM/ +2 PROM  0  Ankle eversion 10 8/12   Sits in 15 deg Eversion.  PROM to 25 deg    (Blank rows = not tested)   LE MMT:   MMT Right 12/28/2021 Left 12/28/2021 Lt 02/19/22 Lt.   04/04/22 R/L 04/09/22  Hip flexion 4 4     Hip extension         Hip abduction       3+/3+  Hip adduction         Hip internal rotation         Hip external rotation         Knee flexion 5 5 5     Knee extension 5 5 5     Ankle dorsiflexion 5 5 5     Ankle plantarflexion 4 4- 4- 4-   Ankle inversion 5 2- 3 3+   Ankle eversion 5 2- 3- 3    (Blank rows = not tested)     FUNCTIONAL TESTS:  Sit to stand x 5 , 15 sec NO UEs   GAIT: Distance walked: 150 Assistive device utilized: Single point cane Level of assistance: Modified independence Comments: LLE abducted, external rotation of hip  [facility] Adult PT Treatment:                                                DATE: 04/09/22 Therapeutic Exercise: Nustep LE and UE L 5 for 7 min  Sidelying hip abduction 2 x 15  Quadruped knee extension x 15  Standing balance on Airex: Tandem static/head turns and nods, narrow/eyes closed Modalities: Vasopneumatic 15 min coldest setting low compression with elevation    [facility] Adult PT Treatment:                                                DATE: 04/04/22 Therapeutic Exercise: NuStep L 6 UE and LE for 6 min Ankle AROM, circles, pumps Leg press 2.5 plates double leg x 20, then 3.5 plates x 20 Lt. LE 2 x 10 1 plate  Manual Therapy: Edema control with elevation Retromassage  KT tape 3 fans for edema  Modalities: Vasopneumatic 15 min with elevation , coldest setting and mod compression Self Care: POC, FOTO  Circumference 23 inches figure 8 pre manual down to 22 inches   [facility] Adult PT Treatment:  DATE: 8/1//2023 Therapeutic Exercise: NuStep L6 UE and LE for 6 min  Slant board stretch - 3x 45'' Supported squats at bar 10 x 2 Heel raises double leg x 20  On 2'' step Blue rocker board A/P weight shifting x1' Blue rocker board lateral weight shifting x1' Toe raises x20 Alternating standing hip abduction 2 x 10 each with GTB at knees Side stepping at  counter RTB at ankles x2 laps NMRE: Tandem stance x30" BIL (intermittent UE support with L in back) Romberg stance on Airex 2x30" EO Modalities: Vaso 15 min ankle as previous coldest setting and low pressure Edema circum. 33 cm left ankle   [facility] Adult PT Treatment:                                                DATE: 03/26/2022 Therapeutic Exercise: NuStep L6 UE and LE for 6 min  Slant board stretch - 3x 45'' Supported squats at bar 10 x 2 Heel raises double leg x 20  On 2'' step 2'' step down 2x Blue rocker board A/P weight shifting Blue rocker board lateral weight shifting Toe raises x20 Alternating standing hip abduction 2 x 10 each with GTB at knees Modalities: Vaso 15 min ankle as previous coldest setting and low pressure Edema circum. 31 cm left ankle   [facility] Adult PT Treatment:                                                DATE: 03/21/2022 Therapeutic Exercise: NuStep L6 UE and LE for 6 min  Supported squats at bar 10 x 2 Heel raises double leg x 20  Blue rocker board A/P weight shifting Blue rocker board lateral weight shifting Slant board gastroc stretch 2x1' Toe raises x20 Side hip abduction 2 x 10 each  Modalities: Vaso 15 min ankle as previous coldest setting and low pressure Edema circum. 33 cm left ankle    PATIENT EDUCATION:  Education details: HEP, POC  Person educated: Patient Education method: Explanation, Demonstration, and Handouts Education comprehension: verbalized understanding, returned demonstration, verbal cues required, tactile cues required, and needs further education     HOME EXERCISE PROGRAM: Access Code: [email or web address] URL: [email or web address] Date: 12/28/2021 Prepared by: Raeford Razor   Exercises - Seated Toe Towel Scrunches  - 2-3 x daily - 7 x weekly - 2 sets - 10 reps - 5 hold - Seated Heel Raise  - 2-3 x daily - 7 x weekly - 2 sets - 10 reps - 5 hold - Supine Ankle Circles  - 2-3 x daily - 7 x weekly - 1 sets - 5 reps  - 30 hold - Supine Single Leg Ankle Pumps  - 2-3 x daily - 7 x weekly - 2 sets - 10 reps - 5 hold - Seated Gastroc Stretch with Strap  - 2-3 x daily - 7 x weekly - 1 sets - 5 reps - 30 hold   ASSESSMENT:   CLINICAL IMPRESSION: Patient worked on bilateral hip strength today in addition to ankle in closed chain.  Ankle less swollen today, measuring    OBJECTIVE IMPAIRMENTS Abnormal gait, decreased activity tolerance, decreased balance, decreased mobility, difficulty walking, decreased ROM, decreased strength, hypomobility, increased edema, increased fascial  restrictions, impaired flexibility, postural dysfunction, obesity, and pain.    ACTIVITY LIMITATIONS cleaning, community activity, meal prep, occupation, laundry, and shopping.    PERSONAL FACTORS Time since onset of injury/illness/exacerbation and 1-2 comorbidities: OA, depression  are also affecting patient's functional outcome.      REHAB POTENTIAL: Excellent   CLINICAL DECISION MAKING: Stable/uncomplicated   EVALUATION COMPLEXITY: Low     GOALS: Goals reviewed with patient? No   SHORT TERM GOALS: Target date: 01/25/2022   Pt will be able to show independence with HEP for LE strength and ROM  Baseline: Goal status: MET   2.  Pt will be able to demo increased ability to weight bear through LLE with transfers and gait  Baseline:  Goal status MET   3.  Pt will verbalize measures to reduce swelling and pain at home  Baseline:  Goal status: MET     LONG TERM GOALS: Target date: 02/22/2022   Pt will be able to walk without the cane and min noticeable limp for 500 feet in the clinic Baseline:  Status: 03/12/22: significant limp with SPC, toe out.  Needs cane longer distances, does try to go without as best she can to challenge herself  Goal status: ongoing    2.  Pt will be able to improve FOTO score to > 60% to demo improved functional mobility.  Baseline:  29% Status: 55% as of renewal (02/19/22). 04/04/22 :56%  Goal  status: ongoing    3.  Pt will be able to negotiate stairs (14 +) with 1 rail reciprocally and min pain in LLE  Baseline: unable  Status: 04/04/22: recently moved to apt with no stairs Goal status: ongoing    4.  Pt will be able to perform static balance exercises (SLS, tandem) for 30 sec at a time with light UE support Baseline: not tested Goal status: ongoing      PLAN: PT FREQUENCY: 2x/week   PT DURATION: 8 weeks   PLANNED INTERVENTIONS: Therapeutic exercises, Therapeutic activity, Neuromuscular re-education, Balance training, Gait training, Patient/Family education, Joint mobilization, Stair training, DME instructions, Cryotherapy, Moist heat, Taping, Vasopneumatic device, and Manual therapy   PLAN FOR NEXT SESSION: add to HEP, manual ,  edema management . CKC as able     Clint Guy PT 04/09/22 11:56 AM

## 2022-04-09 ENCOUNTER — Encounter: Payer: Self-pay | Admitting: Physical Therapy

## 2022-04-09 ENCOUNTER — Ambulatory Visit: Payer: Medicare Other | Admitting: Physical Therapy

## 2022-04-09 DIAGNOSIS — R600 Localized edema: Secondary | ICD-10-CM

## 2022-04-09 DIAGNOSIS — R262 Difficulty in walking, not elsewhere classified: Secondary | ICD-10-CM

## 2022-04-09 DIAGNOSIS — Z981 Arthrodesis status: Secondary | ICD-10-CM | POA: Diagnosis not present

## 2022-04-09 DIAGNOSIS — M25672 Stiffness of left ankle, not elsewhere classified: Secondary | ICD-10-CM | POA: Diagnosis not present

## 2022-04-16 ENCOUNTER — Encounter: Payer: Self-pay | Admitting: Physical Therapy

## 2022-04-16 ENCOUNTER — Ambulatory Visit: Payer: Medicare Other | Admitting: Physical Therapy

## 2022-04-16 DIAGNOSIS — R600 Localized edema: Secondary | ICD-10-CM

## 2022-04-16 DIAGNOSIS — R262 Difficulty in walking, not elsewhere classified: Secondary | ICD-10-CM

## 2022-04-16 DIAGNOSIS — M25672 Stiffness of left ankle, not elsewhere classified: Secondary | ICD-10-CM

## 2022-04-16 DIAGNOSIS — Z981 Arthrodesis status: Secondary | ICD-10-CM | POA: Diagnosis not present

## 2022-04-16 NOTE — Therapy (Signed)
OUTPATIENT PHYSICAL THERAPY TREATMENT NOTE  Patient Name: Michelle Bell MRN: [id number] DOB:03/26/1976, 46 y.o., female Today's Date: 04/16/2022   PCP/REFERRING PROVIDER: Ashley Vanstory, PA  END OF SESSION:   PT End of Session - 04/16/22 0900     Visit Number 16    Number of Visits 18    Date for PT Re-Evaluation 05/02/22    Authorization Type MCR/MCD    PT Start Time 0915    PT Stop Time 0956    PT Time Calculation (min) 41 min    Activity Tolerance Patient tolerated treatment well    Behavior During Therapy WFL for tasks assessed/performed                Past Medical History:  Diagnosis Date   Anxiety    Depression    Hyperlipidemia, mixed    Hypertension    Iron 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)    previously had Iron infusion's by dr gudena   Menorrhagia with regular cycle    OA (osteoarthritis)    Past Surgical History:  Procedure Laterality Date   BREAST REDUCTION SURGERY Bilateral 01/31/2021   Procedure: MAMMARY REDUCTION  (BREAST);  Surgeon: Dillingham, Claire S, DO;  Location: [facility];  Service: Plastics;  Laterality: Bilateral;   CESAREAN SECTION     FUSION OF TALONAVICULAR JOINT Left 10/29/2021   Procedure: FUSION OF TALONAVICULAR JOINT;  Surgeon: Patel, Kevin P, DPM;  Location: [facility];  Service: Podiatry;  Laterality: Left;  GENERAL WITH BLOCK   MASS EXCISION Left 12/23/2016   Procedure: EXCISION LEFT GROIN MASS;  Surgeon: Douglas Blackman, MD;  Location: [facility];  Service: General;  Laterality: Left;   SUBTALAR JOINT ARTHROEREISIS Left 10/29/2021   Procedure: SUBTALAR JOINT ARTHRODESIS;  Surgeon: Patel, Kevin P, DPM;  Location: [facility];  Service: Podiatry;  Laterality: Left;   Patient Active Problem List   Diagnosis Date Noted   Back pain 06/09/2020   Neck pain 06/09/2020   Symptomatic mammary hypertrophy 06/09/2020   Iron deficiency anemia 10/14/2018    REFERRING DIAG:  ankle fusion  THERAPY DIAG:  Localized edema  Difficulty in walking, not elsewhere classified  Stiffness of left ankle, not elsewhere classified  PERTINENT HISTORY: see snapshot   PRECAUTIONS: CAM boot WBAT   SUBJECTIVE: Pt reports that overall her ankle is doing a little better.  She still has quite a bit of soreness with standing for long periods.  PAIN:  Are you having pain? Yes:  NPRS scale: 5/10  Pain location: L ankle  Pain description: tight  Aggravating factors: standing too long  Relieving factors: rest,elevation   OBJECTIVE: (objective measures completed at initial evaluation unless otherwise dated)  DIAGNOSTIC FINDINGS: Radiographs: 3 views of skeletally mature adult left foot: Hardware is intact no signs of loosening or backing out noted.  Good correction alignment noted.   There are some consolidation noted.    PATIENT SURVEYS:  FOTO 29% FOTO 56% 02/19/22   COGNITION:           Overall cognitive status: Within functional limits for tasks assessed                          SENSATION: Light touch: Impaired numb medial ankle    POSTURE:  Standings with LLE abducted and ER to accommodate boot   PALPATION: Pain with palpation to L dorsum of foot and medially along scar, wrapping posteriorly to heel      LE ROM:   Active /PROM Right 12/28/2021 Left 12/28/2021 Left 02/06/22 Left  03/12/22 Lt  04/04/22  Hip flexion         Hip extension         Hip abduction         Hip adduction         Hip internal rotation         Hip external rotation         Knee flexion         Knee extension         Ankle dorsiflexion 10 +6 -2 AROM/+2 PROM 0 AROM 0 AROM  Ankle plantarflexion   16 30 AROM / 35 PROM  20 AROM 28 AROM  Ankle inversion 42 0/0 0 AROM/ +2 PROM  0  Ankle eversion 10 8/12   Sits in 15 deg Eversion.  PROM to 25 deg    (Blank rows = not tested)   LE MMT:   MMT Right 12/28/2021 Left 12/28/2021 Lt 02/19/22 Lt.  04/04/22 R/L 04/09/22  Hip flexion 4 4     Hip  extension         Hip abduction       3+/3+  Hip adduction         Hip internal rotation         Hip external rotation         Knee flexion _0 Knee extension _1 Ankle dorsiflexion _2 Ankle plantarflexion 4 4- 4- 4-   Ankle inversion 5 2- 3 3+   Ankle eversion 5 2- 3- 3    (Blank rows = not tested)     FUNCTIONAL TESTS:  Sit to stand x 5 , 15 sec NO UEs   GAIT: Distance walked: 150 Assistive device utilized: Single point cane Level of assistance: Modified independence Comments: LLE abducted, external rotation of hip   [facility] Adult PT Treatment:                                                DATE: 04/16/22 Therapeutic Exercise: Nustep LE and UE L 7 for 7 min  Sidelying hip abduction 2 x 15  Step up - 4'' step - x10 ea Step down - 2'' step - x10 ea (sig cuing to achieve knee flexion/ankle DF) Standing balance on Airex: Tandem static/head turns and nods, tandem sane Modalities: Vasopneumatic 15 min coldest setting low compression with elevation  [facility] Adult PT Treatment:                                                DATE: 04/09/22 Therapeutic Exercise: Nustep LE and UE L 5 for 7 min  Sidelying hip abduction 2 x 15  Quadruped knee extension x 15  Standing balance on Airex: Tandem static/head turns and nods, narrow/eyes closed Modalities: Vasopneumatic 15 min coldest setting low compression with elevation    [facility] Adult PT Treatment:  DATE: 04/04/22 Therapeutic Exercise: NuStep L 6 UE and LE for 6 min Ankle AROM, circles, pumps Leg press 2.5 plates double leg x 20, then 3.5 plates x 20 Lt. LE 2 x 10 1 plate  Manual Therapy: Edema control with elevation Retromassage  KT tape 3 fans for edema  Modalities: Vasopneumatic 15 min with elevation , coldest setting and mod compression Self Care: POC, FOTO  Circumference 23 inches figure 8 pre manual down to 22 inches   [facility] Adult PT Treatment:                                                 DATE: 8/1//2023 Therapeutic Exercise: NuStep L6 UE and LE for 6 min  Slant board stretch - 3x 45'' Supported squats at bar 10 x 2 Heel raises double leg x 20  On 2'' step Blue rocker board A/P weight shifting x1' Blue rocker board lateral weight shifting x1' Toe raises x20 Alternating standing hip abduction 2 x 10 each with GTB at knees Side stepping at counter RTB at ankles x2 laps NMRE: Tandem stance x30" BIL (intermittent UE support with L in back) Romberg stance on Airex 2x30" EO Modalities: Vaso 15 min ankle as previous coldest setting and low pressure Edema circum. 33 cm left ankle   [facility] Adult PT Treatment:                                                DATE: 03/26/2022 Therapeutic Exercise: NuStep L6 UE and LE for 6 min  Slant board stretch - 3x 45'' Supported squats at bar 10 x 2 Heel raises double leg x 20  On 2'' step 2'' step down 2x Blue rocker board A/P weight shifting Blue rocker board lateral weight shifting Toe raises x20 Alternating standing hip abduction 2 x 10 each with GTB at knees Modalities: Vaso 15 min ankle as previous coldest setting and low pressure Edema circum. 31 cm left ankle   [facility] Adult PT Treatment:                                                DATE: 03/21/2022 Therapeutic Exercise: NuStep L6 UE and LE for 6 min  Supported squats at bar 10 x 2 Heel raises double leg x 20  Blue rocker board A/P weight shifting Blue rocker board lateral weight shifting Slant board gastroc stretch 2x1' Toe raises x20 Side hip abduction 2 x 10 each  Modalities: Vaso 15 min ankle as previous coldest setting and low pressure Edema circum. 33 cm left ankle   HOME EXERCISE PROGRAM: Access Code: [email or web address] URL: [email or web address] Date: 12/28/2021 Prepared by: Jennifer Paa   Exercises - Seated Toe Towel Scrunches  - 2-3 x daily - 7 x weekly - 2 sets - 10 reps - 5 hold - Seated Heel Raise  - 2-3 x daily - 7 x weekly -  2 sets - 10 reps - 5 hold - Supine Ankle Circles  - 2-3 x daily - 7 x weekly - 1 sets - 5 reps - 30 hold - Supine Single Leg   Ankle Pumps  - 2-3 x daily - 7 x weekly - 2 sets - 10 reps - 5 hold - Seated Gastroc Stretch with Strap  - 2-3 x daily - 7 x weekly - 1 sets - 5 reps - 30 hold   ASSESSMENT:   CLINICAL IMPRESSION: Michelle Bell tolerated session well with no adverse reaction.  We continued to work on balance on unstable surface with progressively narrow base of support.  Pt does require cuing for form with hip abduction to avoid flexion compensation.     OBJECTIVE IMPAIRMENTS Abnormal gait, decreased activity tolerance, decreased balance, decreased mobility, difficulty walking, decreased ROM, decreased strength, hypomobility, increased edema, increased fascial restrictions, impaired flexibility, postural dysfunction, obesity, and pain.    ACTIVITY LIMITATIONS cleaning, community activity, meal prep, occupation, laundry, and shopping.    PERSONAL FACTORS Time since onset of injury/illness/exacerbation and 1-2 comorbidities: OA, depression  are also affecting patient's functional outcome.      REHAB POTENTIAL: Excellent   CLINICAL DECISION MAKING: Stable/uncomplicated   EVALUATION COMPLEXITY: Low     GOALS: Goals reviewed with patient? No   SHORT TERM GOALS: Target date: 01/25/2022   Pt will be able to show independence with HEP for LE strength and ROM  Baseline: Goal status: MET   2.  Pt will be able to demo increased ability to weight bear through LLE with transfers and gait  Baseline:  Goal status MET   3.  Pt will verbalize measures to reduce swelling and pain at home  Baseline:  Goal status: MET     LONG TERM GOALS: Target date: 02/22/2022   Pt will be able to walk without the cane and min noticeable limp for 500 feet in the clinic Baseline:  Status: 03/12/22: significant limp with SPC, toe out.  Needs cane longer distances, does try to go without as best she can to  challenge herself  Goal status: ongoing    2.  Pt will be able to improve FOTO score to > 60% to demo improved functional mobility.  Baseline:  29% Status: 55% as of renewal (02/19/22). 04/04/22 :56%  Goal status: ongoing    3.  Pt will be able to negotiate stairs (14 +) with 1 rail reciprocally and min pain in LLE  Baseline: unable  Status: 04/04/22: recently moved to apt with no stairs Goal status: ongoing    4.  Pt will be able to perform static balance exercises (SLS, tandem) for 30 sec at a time with light UE support Baseline: not tested Goal status: ongoing      PLAN: PT FREQUENCY: 2x/week   PT DURATION: 8 weeks   PLANNED INTERVENTIONS: Therapeutic exercises, Therapeutic activity, Neuromuscular re-education, Balance training, Gait training, Patient/Family education, Joint mobilization, Stair training, DME instructions, Cryotherapy, Moist heat, Taping, Vasopneumatic device, and Manual therapy   PLAN FOR NEXT SESSION: add to HEP, manual ,  edema management . CKC as able     Karl E Reinhartsen PT 04/16/22 10:00 AM 

## 2022-04-23 ENCOUNTER — Ambulatory Visit: Payer: Medicare Other

## 2022-04-23 DIAGNOSIS — M25672 Stiffness of left ankle, not elsewhere classified: Secondary | ICD-10-CM | POA: Diagnosis not present

## 2022-04-23 DIAGNOSIS — Z981 Arthrodesis status: Secondary | ICD-10-CM

## 2022-04-23 DIAGNOSIS — R600 Localized edema: Secondary | ICD-10-CM

## 2022-04-23 DIAGNOSIS — R262 Difficulty in walking, not elsewhere classified: Secondary | ICD-10-CM

## 2022-04-23 NOTE — Therapy (Signed)
OUTPATIENT PHYSICAL THERAPY TREATMENT NOTE  Patient Name: Michelle Bell MRN: [id number] DOB:[birth year], 46 y.o., female Today's Date: 04/23/2022   PCP/REFERRING PROVIDER: Raelyn Number, PA  END OF SESSION:   PT End of Session - 04/23/22 1046     Visit Number 17    Number of Visits 18    Date for PT Re-Evaluation 05/02/22    Authorization Type MCR/MCD    PT Start Time 1045    PT Stop Time 1135    PT Time Calculation (min) 50 min    Activity Tolerance Patient tolerated treatment well    Behavior During Therapy WFL for tasks assessed/performed                 Past Medical History:  Diagnosis Date   Anxiety    Depression    Hyperlipidemia, mixed    Hypertension    Iron 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)    previously had Iron infusion's by dr Lindi Adie   Menorrhagia with regular cycle    OA (osteoarthritis)    Past Surgical History:  Procedure Laterality Date   BREAST REDUCTION SURGERY Bilateral 01/31/2021   Procedure: MAMMARY REDUCTION  (BREAST);  Surgeon: Wallace Going, DO;  Location: [facility];  Service: Plastics;  Laterality: Bilateral;   CESAREAN SECTION     FUSION OF TALONAVICULAR JOINT Left 10/29/2021   Procedure: FUSION OF TALONAVICULAR JOINT;  Surgeon: Felipa Furnace, DPM;  Location: [facility];  Service: Podiatry;  Laterality: Left;  GENERAL WITH BLOCK   MASS EXCISION Left 12/23/2016   Procedure: EXCISION LEFT GROIN MASS;  Surgeon: Coralie Keens, MD;  Location: [facility];  Service: General;  Laterality: Left;   SUBTALAR JOINT ARTHROEREISIS Left 10/29/2021   Procedure: SUBTALAR JOINT ARTHRODESIS;  Surgeon: Felipa Furnace, DPM;  Location: [facility];  Service: Podiatry;  Laterality: Left;   Patient Active Problem List   Diagnosis Date Noted   Back pain 06/09/2020   Neck pain 06/09/2020   Symptomatic mammary hypertrophy 06/09/2020   Iron deficiency anemia 10/14/2018    REFERRING  DIAG: ankle fusion  THERAPY DIAG:  Localized edema  Difficulty in walking, not elsewhere classified  Stiffness of left ankle, not elsewhere classified  Status post ankle fusion  PERTINENT HISTORY: see snapshot   PRECAUTIONS: CAM boot WBAT   SUBJECTIVE: Patient states she has soreness when standing for a long period of time.  PAIN:  Are you having pain? Yes:  NPRS scale: 0/10 currently (2/10 when standing)  Pain location: L ankle  Pain description: tight  Aggravating factors: standing too long  Relieving factors: rest,elevation   OBJECTIVE: (objective measures completed at initial evaluation unless otherwise dated)  DIAGNOSTIC FINDINGS: Radiographs: 3 views of skeletally mature adult left foot: Hardware is intact no signs of loosening or backing out noted.  Good correction alignment noted.   There are some consolidation noted.    PATIENT SURVEYS:  FOTO 29% FOTO 56% 02/19/22   COGNITION:           Overall cognitive status: Within functional limits for tasks assessed                          SENSATION: Light touch: Impaired numb medial ankle    POSTURE:  Standings with LLE abducted and ER to accommodate boot   PALPATION: Pain with palpation to L dorsum of foot and medially along scar, wrapping posteriorly to heel  LE ROM:   Active /PROM Right 12/28/2021 Left 12/28/2021 Left 02/06/22 Left  03/12/22 Lt  04/04/22  Hip flexion         Hip extension         Hip abduction         Hip adduction         Hip internal rotation         Hip external rotation         Knee flexion         Knee extension         Ankle dorsiflexion 10 +6 -2 AROM/+2 PROM 0 AROM 0 AROM  Ankle plantarflexion   16 30 AROM / 35 PROM  20 AROM 28 AROM  Ankle inversion 42 0/0 0 AROM/ +2 PROM  0  Ankle eversion 10 8/12   Sits in 15 deg Eversion.  PROM to 25 deg    (Blank rows = not tested)   LE MMT:   MMT Right 12/28/2021 Left 12/28/2021 Lt 02/19/22 Lt.  04/04/22 R/L 04/09/22  Hip flexion 4 4      Hip extension         Hip abduction       3+/3+  Hip adduction         Hip internal rotation         Hip external rotation         Knee flexion 5 5 5     Knee extension 5 5 5     Ankle dorsiflexion 5 5 5     Ankle plantarflexion 4 4- 4- 4-   Ankle inversion 5 2- 3 3+   Ankle eversion 5 2- 3- 3    (Blank rows = not tested)     FUNCTIONAL TESTS:  Sit to stand x 5 , 15 sec NO UEs   GAIT: Distance walked: 150 Assistive device utilized: Single point cane Level of assistance: Modified independence Comments: LLE abducted, external rotation of hip   [facility] Adult PT Treatment:                                                DATE: 04/23/2022 Therapeutic Exercise: Nustep LE and UE L 7 for 7 min  Sidelying hip abduction 2 x 15  Step up - 4'' step - x10 ea forward/ lateral Step down - 2'' step - x10 ea (sig cuing to achieve knee flexion/ankle DF) Standing balance on Airex: Tandem static/head turns and nods Modalities: Vasopneumatic 15 min coldest setting low compression with elevation  [facility] Adult PT Treatment:                                                DATE: 04/16/22 Therapeutic Exercise: Nustep LE and UE L 7 for 7 min  Sidelying hip abduction 2 x 15  Step up - 4'' step - x10 ea Step down - 2'' step - x10 ea (sig cuing to achieve knee flexion/ankle DF) Standing balance on Airex: Tandem static/head turns and nods, tandem sane Modalities: Vasopneumatic 15 min coldest setting low compression with elevation  [facility] Adult PT Treatment:  DATE: 04/09/22 Therapeutic Exercise: Nustep LE and UE L 5 for 7 min  Sidelying hip abduction 2 x 15  Quadruped knee extension x 15  Standing balance on Airex: Tandem static/head turns and nods, narrow/eyes closed Modalities: Vasopneumatic 15 min coldest setting low compression with elevation     HOME EXERCISE PROGRAM: Access Code: [email or web address] URL: [email or web address] Date: 12/28/2021 Prepared  by: Raeford Razor   Exercises - Seated Toe Towel Scrunches  - 2-3 x daily - 7 x weekly - 2 sets - 10 reps - 5 hold - Seated Heel Raise  - 2-3 x daily - 7 x weekly - 2 sets - 10 reps - 5 hold - Supine Ankle Circles  - 2-3 x daily - 7 x weekly - 1 sets - 5 reps - 30 hold - Supine Single Leg Ankle Pumps  - 2-3 x daily - 7 x weekly - 2 sets - 10 reps - 5 hold - Seated Gastroc Stretch with Strap  - 2-3 x daily - 7 x weekly - 1 sets - 5 reps - 30 hold   ASSESSMENT:   CLINICAL IMPRESSION: Patient presents to PT with continued soreness in Lt ankle. Session today focused on LE strengthening and proprioceptive tasks to improve balance. Patient was able to tolerate all prescribed exercises with no adverse effects. Patient continues to benefit from skilled PT services and should be progressed as able to improve functional independence.     OBJECTIVE IMPAIRMENTS Abnormal gait, decreased activity tolerance, decreased balance, decreased mobility, difficulty walking, decreased ROM, decreased strength, hypomobility, increased edema, increased fascial restrictions, impaired flexibility, postural dysfunction, obesity, and pain.    ACTIVITY LIMITATIONS cleaning, community activity, meal prep, occupation, laundry, and shopping.    PERSONAL FACTORS Time since onset of injury/illness/exacerbation and 1-2 comorbidities: OA, depression  are also affecting patient's functional outcome.      REHAB POTENTIAL: Excellent   CLINICAL DECISION MAKING: Stable/uncomplicated   EVALUATION COMPLEXITY: Low     GOALS: Goals reviewed with patient? No   SHORT TERM GOALS: Target date: 01/25/2022   Pt will be able to show independence with HEP for LE strength and ROM  Baseline: Goal status: MET   2.  Pt will be able to demo increased ability to weight bear through LLE with transfers and gait  Baseline:  Goal status MET   3.  Pt will verbalize measures to reduce swelling and pain at home  Baseline:  Goal status: MET      LONG TERM GOALS: Target date: 02/22/2022   Pt will be able to walk without the cane and min noticeable limp for 500 feet in the clinic Baseline:  Status: 03/12/22: significant limp with SPC, toe out.  Needs cane longer distances, does try to go without as best she can to challenge herself  Goal status: ongoing    2.  Pt will be able to improve FOTO score to > 60% to demo improved functional mobility.  Baseline:  29% Status: 55% as of renewal (02/19/22). 04/04/22 :56%  Goal status: ongoing    3.  Pt will be able to negotiate stairs (14 +) with 1 rail reciprocally and min pain in LLE  Baseline: unable  Status: 04/04/22: recently moved to apt with no stairs Goal status: ongoing    4.  Pt will be able to perform static balance exercises (SLS, tandem) for 30 sec at a time with light UE support Baseline: not tested Goal status: ongoing      PLAN: PT FREQUENCY: 2x/week  PT DURATION: 8 weeks   PLANNED INTERVENTIONS: Therapeutic exercises, Therapeutic activity, Neuromuscular re-education, Balance training, Gait training, Patient/Family education, Joint mobilization, Stair training, DME instructions, Cryotherapy, Moist heat, Taping, Vasopneumatic device, and Manual therapy   PLAN FOR NEXT SESSION: add to HEP, manual ,  edema management . CKC as able     Margarette Canada PTA 04/23/22 11:21 AM

## 2022-04-29 NOTE — Therapy (Unsigned)
OUTPATIENT PHYSICAL THERAPY TREATMENT NOTE Discharge  Patient Name: Michelle Bell MRN: [id number] DOB:04-17-1976, 46 y.o., female Today's Date: 04/30/2022   PCP/REFERRING PROVIDER: Raelyn Number, PA  END OF SESSION:   PT End of Session - 04/30/22 1022     Visit Number 18    Number of Visits 18    Date for PT Re-Evaluation 05/02/22    Authorization Type MCR/MCD    PT Start Time 1018    PT Stop Time 1115    PT Time Calculation (min) 57 min    Activity Tolerance Patient tolerated treatment well    Behavior During Therapy WFL for tasks assessed/performed                  Past Medical History:  Diagnosis Date   Anxiety    Depression    Hyperlipidemia, mixed    Hypertension    Iron 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)    previously had Iron infusion's by dr Lindi Adie   Menorrhagia with regular cycle    OA (osteoarthritis)    Past Surgical History:  Procedure Laterality Date   BREAST REDUCTION SURGERY Bilateral 01/31/2021   Procedure: MAMMARY REDUCTION  (BREAST);  Surgeon: Wallace Going, DO;  Location: [place];  Service: Plastics;  Laterality: Bilateral;   CESAREAN SECTION     FUSION OF TALONAVICULAR JOINT Left 10/29/2021   Procedure: FUSION OF TALONAVICULAR JOINT;  Surgeon: Felipa Furnace, DPM;  Location: [place];  Service: Podiatry;  Laterality: Left;  GENERAL WITH BLOCK   MASS EXCISION Left 12/23/2016   Procedure: EXCISION LEFT GROIN MASS;  Surgeon: Coralie Keens, MD;  Location: [facility];  Service: General;  Laterality: Left;   SUBTALAR JOINT ARTHROEREISIS Left 10/29/2021   Procedure: SUBTALAR JOINT ARTHRODESIS;  Surgeon: Felipa Furnace, DPM;  Location: [place];  Service: Podiatry;  Laterality: Left;   Patient Active Problem List   Diagnosis Date Noted   Back pain 06/09/2020   Neck pain 06/09/2020   Symptomatic mammary hypertrophy 06/09/2020   Iron deficiency anemia 10/14/2018     REFERRING DIAG: ankle fusion  THERAPY DIAG:  Localized edema  Difficulty in walking, not elsewhere classified  Stiffness of left ankle, not elsewhere classified  Status post ankle fusion  PERTINENT HISTORY: see snapshot   PRECAUTIONS: CAM boot WBAT   SUBJECTIVE: Pt with cramping in lateral ankle as she walked back to clinic. Happens from time to time.   PAIN:  Are you having pain? Yes:  NPRS scale: 0/10 currently (2/10 when standing)  Pain location: L ankle  Pain description: tight  Aggravating factors: standing too long  Relieving factors: rest,elevation   OBJECTIVE: (objective measures completed at initial evaluation unless otherwise dated)  DIAGNOSTIC FINDINGS: Radiographs: 3 views of skeletally mature adult left foot: Hardware is intact no signs of loosening or backing out noted.  Good correction alignment noted.   There are some consolidation noted.    PATIENT SURVEYS:  EVAL: FOTO 29% 02/19/22: FOTO 56% \ 04/30/22: FOTO 57%   COGNITION:           Overall cognitive status: Within functional limits for tasks assessed                          SENSATION: Light touch: Impaired numb medial ankle    POSTURE:  Standings with LLE abducted and ER to accommodate boot   PALPATION: Pain with palpation to L dorsum  of foot and medially along scar, wrapping posteriorly to heel    LE ROM:   Active /PROM Right 12/28/2021 Left 12/28/2021 Left 02/06/22 Left  03/12/22 Lt  04/04/22 Lt.  04/30/22  Hip flexion          Hip extension          Hip abduction          Hip adduction          Hip internal rotation          Hip external rotation          Knee flexion          Knee extension          Ankle dorsiflexion 10 +6 -2 AROM/+2 PROM 0 AROM 0 AROM +4 deg  Ankle plantarflexion   16 30 AROM / 35 PROM  20 AROM 28 AROM 35 deg   Ankle inversion 42 0/0 0 AROM/ +2 PROM  0 0  Ankle eversion 10 8/12   Sits in 15 deg Eversion.  PROM to 25 deg  10 deg at rest, can go to 15 deg     (Blank rows = not tested)   LE MMT:   MMT Right 12/28/2021 Left 12/28/2021 Lt 02/19/22 Lt.  04/04/22 R/L 04/09/22  Hip flexion 4 4     Hip extension         Hip abduction       3+/3+  Hip adduction         Hip internal rotation         Hip external rotation         Knee flexion 5 5 5     Knee extension 5 5 5     Ankle dorsiflexion 5 5 5     Ankle plantarflexion 4 4- 4- 4-   Ankle inversion 5 2- 3 3+   Ankle eversion 5 2- 3- 3    (Blank rows = not tested)     FUNCTIONAL TESTS:  Sit to stand x 5 , 15 sec NO UEs 04/30/22 STS x 5 , 2 trials 14.84 sec no UEs , then 1.7 sec 2nd trial    GAIT: Distance walked: 150 Assistive device utilized: Single point cane Level of assistance: Modified independence Comments: LLE abducted, external rotation of hip    [facility] Adult PT Treatment:                                                DATE: 04/30/22 Therapeutic Exercise: Sit to stand no UEs  x 2  NuStep L 7 UE and LE , 6 min  Step ups lateral and forward with bilateral UEs  (6 inch)  Hip abduction x 15 Tandem  SLS: unable to do this even with UE assisting  Ankle ROM  Modalities: Vasopneumatic 15 min coldest setting low compression with elevation Self Care: Plan, Discharge, return to MD , swelling, goals    [facility] Adult PT Treatment:                                                DATE: 04/23/2022 Therapeutic Exercise: Nustep LE and UE L 7 for 7 min  Sidelying hip abduction 2 x 15  Step up - 4'' step - x10 ea forward/ lateral Step down - 2'' step - x10 ea (sig cuing to achieve knee flexion/ankle DF) Standing balance on Airex: Tandem static/head turns and nods Modalities: Vasopneumatic 15 min coldest setting low compression with elevation  [facility] Adult PT Treatment:                                                DATE: 04/16/22 Therapeutic Exercise: Nustep LE and UE L 7 for 7 min  Sidelying hip abduction 2 x 15  Step up - 4'' step - x10 ea Step down - 2'' step - x10 ea (sig cuing to achieve  knee flexion/ankle DF) Standing balance on Airex: Tandem static/head turns and nods, tandem sane Modalities: Vasopneumatic 15 min coldest setting low compression with elevation  [facility] Adult PT Treatment:                                                DATE: 04/09/22 Therapeutic Exercise: Nustep LE and UE L 5 for 7 min  Sidelying hip abduction 2 x 15  Quadruped knee extension x 15  Standing balance on Airex: Tandem static/head turns and nods, narrow/eyes closed Modalities: Vasopneumatic 15 min coldest setting low compression with elevation     HOME EXERCISE PROGRAM: Access Code: [email or web address] URL: [email or web address] Date: 12/28/2021 Prepared by: Raeford Razor   Exercises - Seated Toe Towel Scrunches  - 2-3 x daily - 7 x weekly - 2 sets - 10 reps - 5 hold - Seated Heel Raise  - 2-3 x daily - 7 x weekly - 2 sets - 10 reps - 5 hold - Supine Ankle Circles  - 2-3 x daily - 7 x weekly - 1 sets - 5 reps - 30 hold - Supine Single Leg Ankle Pumps  - 2-3 x daily - 7 x weekly - 2 sets - 10 reps - 5 hold - Seated Gastroc Stretch with Strap  - 2-3 x daily - 7 x weekly - 1 sets - 5 reps - 30 hold   ASSESSMENT:   CLINICAL IMPRESSION: Pt has plateaued in her progress.  She has limited ankle ROM due to surgery and severe swelling which has been persistent since eval.  She still cannot fully WB on her L ankle without use of UE and trunk support.  Her pain is well controlled.  Gait pattern is antalgic and walks with L hip externally rotated.  Ast this point there is not much more Pt can offer her.  She sees MD tomorrow.  If she needs to come back int he future she is welcome to do so but at this point she is discharged from PT>    OBJECTIVE IMPAIRMENTS Abnormal gait, decreased activity tolerance, decreased balance, decreased mobility, difficulty walking, decreased ROM, decreased strength, hypomobility, increased edema, increased fascial restrictions, impaired flexibility, postural dysfunction,  obesity, and pain.    ACTIVITY LIMITATIONS cleaning, community activity, meal prep, occupation, laundry, and shopping.    PERSONAL FACTORS Time since onset of injury/illness/exacerbation and 1-2 comorbidities: OA, depression  are also affecting patient's functional outcome.      REHAB POTENTIAL: Excellent   CLINICAL DECISION MAKING: Stable/uncomplicated   EVALUATION COMPLEXITY: Low  GOALS: Goals reviewed with patient? No   SHORT TERM GOALS: Target date: 01/25/2022   Pt will be able to show independence with HEP for LE strength and ROM  Baseline: Goal status: MET   2.  Pt will be able to demo increased ability to weight bear through LLE with transfers and gait  Baseline:  Goal status MET   3.  Pt will verbalize measures to reduce swelling and pain at home  Baseline:  Goal status: MET     LONG TERM GOALS: Target date: 02/22/2022   Pt will be able to walk without the cane and min noticeable limp for 500 feet in the clinic Baseline:  Status: 03/12/22: significant limp with SPC, toe out.  Needs cane longer distances, does try to go without as best she can to challenge herself , still limps with cane  Goal status: partially met   2.  Pt will be able to improve FOTO score to > 60% to demo improved functional mobility.  Baseline:  29% Status: 55% as of renewal (02/19/22). 04/04/22 :56% , 04/30/22 57% Goal status: partially met    3.  Pt will be able to negotiate stairs (14 +) with 1 rail reciprocally and min pain in LLE  Baseline: unable  Status: 04/04/22: recently moved to apt with no stairs Goal status: MET in clinic    4.  Pt will be able to perform static balance exercises (SLS, tandem) for 30 sec at a time with light UE support Baseline: not able to do SLR.  Tandem goal met  Goal status: partially met      PLAN: PT FREQUENCY: 2x/week   PT DURATION: 8 weeks   PLANNED INTERVENTIONS: Therapeutic exercises, Therapeutic activity, Neuromuscular re-education, Balance  training, Gait training, Patient/Family education, Joint mobilization, Stair training, DME instructions, Cryotherapy, Moist heat, Taping, Vasopneumatic device, and Manual therapy   PLAN FOR NEXT SESSION: NA , DC from PT      Raeford Razor, PT 04/30/22 12:11 PM Phone: [phone] Fax: [phone]

## 2022-04-30 ENCOUNTER — Ambulatory Visit: Payer: Medicare Other | Admitting: Physical Therapy

## 2022-04-30 ENCOUNTER — Encounter: Payer: Self-pay | Admitting: Physical Therapy

## 2022-04-30 DIAGNOSIS — R262 Difficulty in walking, not elsewhere classified: Secondary | ICD-10-CM | POA: Diagnosis not present

## 2022-04-30 DIAGNOSIS — Z981 Arthrodesis status: Secondary | ICD-10-CM

## 2022-04-30 DIAGNOSIS — R600 Localized edema: Secondary | ICD-10-CM | POA: Diagnosis not present

## 2022-04-30 DIAGNOSIS — M25672 Stiffness of left ankle, not elsewhere classified: Secondary | ICD-10-CM | POA: Diagnosis not present

## 2022-05-01 ENCOUNTER — Ambulatory Visit (INDEPENDENT_AMBULATORY_CARE_PROVIDER_SITE_OTHER): Payer: Medicare Other

## 2022-05-01 ENCOUNTER — Ambulatory Visit (INDEPENDENT_AMBULATORY_CARE_PROVIDER_SITE_OTHER): Payer: Medicare Other | Admitting: Podiatry

## 2022-05-01 DIAGNOSIS — M19072 Primary osteoarthritis, left ankle and foot: Secondary | ICD-10-CM | POA: Diagnosis not present

## 2022-05-01 DIAGNOSIS — M19079 Primary osteoarthritis, unspecified ankle and foot: Secondary | ICD-10-CM

## 2022-05-01 DIAGNOSIS — Z9889 Other specified postprocedural states: Secondary | ICD-10-CM

## 2022-05-01 NOTE — Progress Notes (Signed)
Subjective:  Patient ID: Michelle Bell, female    DOB: 1976-06-24,  MRN: [id number]  No chief complaint on file.   DOS: 10/29/2021 Procedure: Left talonavicular joint fusion with subtalar joint fusion and gastrocnemius recession  46 y.o. female returns for post-op check.  Patient states she is doing okay   She still is doing better there has been some swelling but overall no pain in the foot.  She is able to ambulate with regular shoes without a pain Review of Systems: Negative except as noted in the HPI. Denies N/V/F/Ch.  Past Medical History:  Diagnosis Date   Anxiety    Depression    Hyperlipidemia, mixed    Hypertension    Iron 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)    previously had Iron infusion's by dr Lindi Adie   Menorrhagia with regular cycle    OA (osteoarthritis)     Current Outpatient Medications:    acetaminophen (TYLENOL) 325 MG tablet, Take 325 mg by mouth daily as needed for moderate pain. (Patient not taking: Reported on 12/28/2021), Disp: , Rfl:    amLODipine (NORVASC) 5 MG tablet, Take 5 mg by mouth daily., Disp: , Rfl:    aspirin EC 81 MG tablet, Take 81 mg by mouth daily. (Patient not taking: Reported on 12/28/2021), Disp: , Rfl:    diclofenac Sodium (VOLTAREN) 1 % GEL, Apply topically 4 (four) times daily. (Patient not taking: Reported on 12/28/2021), Disp: , Rfl:    ferrous sulfate 325 (65 FE) MG tablet, Take 325 mg by mouth daily with breakfast. (Patient not taking: Reported on 12/28/2021), Disp: , Rfl:    gabapentin (NEURONTIN) 300 MG capsule, Take 300 mg by mouth at bedtime. (Patient not taking: Reported on 12/28/2021), Disp: , Rfl:    hydrochlorothiazide (HYDRODIURIL) 25 MG tablet, Take 25 mg by mouth daily. (Patient not taking: Reported on 10/29/2021), Disp: , Rfl:    ibuprofen (ADVIL) 800 MG tablet, Take 1 tablet (800 mg total) by mouth every 6 (six) hours as needed., Disp: 60 tablet, Rfl: 1   ibuprofen (ADVIL,MOTRIN) 200 MG tablet, Take 200 mg by  mouth daily as needed for headache or moderate pain. (Patient not taking: Reported on 12/28/2021), Disp: , Rfl:    meloxicam (MOBIC) 7.5 MG tablet, Take 7.5 mg by mouth daily. (Patient not taking: Reported on 10/29/2021), Disp: , Rfl:    Multiple Vitamin (MULTIVITAMIN WITH MINERALS) TABS tablet, Take 1 tablet by mouth daily. (Patient not taking: Reported on 12/28/2021), Disp: , Rfl:    nitroGLYCERIN (NITROGLYN) 2 % ointment, Apply 0.5 inches topically every 6 (six) hours. (Patient not taking: Reported on 02/27/2021), Disp: 30 g, Rfl: 0   ondansetron (ZOFRAN) 4 MG tablet, Take 1 tablet (4 mg total) by mouth every 8 (eight) hours as needed for nausea or vomiting. (Patient not taking: Reported on 12/28/2021), Disp: 20 tablet, Rfl: 0   oxyCODONE-acetaminophen (PERCOCET) 5-325 MG tablet, Take 1 tablet by mouth every 4 (four) hours as needed for severe pain. (Patient not taking: Reported on 12/28/2021), Disp: 30 tablet, Rfl: 0   oxyCODONE-acetaminophen (PERCOCET) 5-325 MG tablet, Take 1 tablet by mouth every 4 (four) hours as needed for severe pain. (Patient not taking: Reported on 12/28/2021), Disp: 30 tablet, Rfl: 0  Social History   Tobacco Use  Smoking Status Never  Smokeless Tobacco Never    No Known Allergies Objective:  There were no vitals filed for this visit. There is no height or weight on file to calculate BMI. Constitutional Well  developed. Well nourished.  Vascular Foot warm and well perfused. Capillary refill normal to all digits.   Neurologic Normal speech. Oriented to person, place, and time. Epicritic sensation to light touch grossly present bilaterally.  Dermatologic Skin completely epithelialized.  Good correction alignment noted.  Good with external deformity noted.  Swelling noted.  Patient is able to ambulate without any restriction  Orthopedic: No tenderness to palpation noted about the surgical site.   Radiographs: 3 views of skeletally mature adult left foot: Hardware is  intact no signs of loosening or backing out noted.  Good correction alignment noted.  Intact hardware.  Some consolidation noted across the fusion site.  It appears in 1 view that the staples and the talonavicular joint might be failing however still compressing across the joint. Assessment:   1. Arthritis of left subtalar joint   2. Osteoarthritis of talonavicular joint   3. Status post foot surgery     Plan:  Patient was evaluated and treated and all questions answered.  S/p foot surgery left -Clinic her pain is resolved she does not have any pain.  At this time I reviewed the radiographs with her in extensive detail.  If she starts getting worsening of the pain we will discuss doing advanced imaging and assessing for nonunion.  As of right now there is some consolidation noted across the fusion site.  And I discussed with her this can take anywhere up to a year to completely consolidate.  I discussed this with patient she states understanding.  If any foot and ankle issues arise in future advised her to come back and see me.  She states understanding  No follow-ups on file.

## 2022-05-02 DIAGNOSIS — F411 Generalized anxiety disorder: Secondary | ICD-10-CM | POA: Diagnosis not present

## 2022-05-02 DIAGNOSIS — F330 Major depressive disorder, recurrent, mild: Secondary | ICD-10-CM | POA: Diagnosis not present

## 2022-05-03 DIAGNOSIS — N920 Excessive and frequent menstruation with regular cycle: Secondary | ICD-10-CM | POA: Diagnosis not present

## 2022-05-03 DIAGNOSIS — I10 Essential (primary) hypertension: Secondary | ICD-10-CM | POA: Diagnosis not present

## 2022-05-03 DIAGNOSIS — D5 Iron 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): Secondary | ICD-10-CM | POA: Diagnosis not present

## 2022-05-03 DIAGNOSIS — N939 Abnormal uterine and vaginal bleeding, unspecified: Secondary | ICD-10-CM | POA: Diagnosis not present

## 2022-05-03 DIAGNOSIS — R202 Paresthesia of skin: Secondary | ICD-10-CM | POA: Diagnosis not present

## 2022-05-03 DIAGNOSIS — E782 Mixed hyperlipidemia: Secondary | ICD-10-CM | POA: Diagnosis not present

## 2022-05-03 DIAGNOSIS — Z87891 Personal history of nicotine dependence: Secondary | ICD-10-CM | POA: Diagnosis not present

## 2022-05-03 DIAGNOSIS — Z0000 Encounter for general adult medical examination without abnormal findings: Secondary | ICD-10-CM | POA: Diagnosis not present

## 2022-05-29 DIAGNOSIS — F411 Generalized anxiety disorder: Secondary | ICD-10-CM | POA: Diagnosis not present

## 2022-05-29 DIAGNOSIS — F330 Major depressive disorder, recurrent, mild: Secondary | ICD-10-CM | POA: Diagnosis not present

## 2022-06-11 DIAGNOSIS — Z23 Encounter for immunization: Secondary | ICD-10-CM | POA: Diagnosis not present

## 2022-06-11 DIAGNOSIS — D5 Iron 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): Secondary | ICD-10-CM | POA: Diagnosis not present

## 2022-06-11 DIAGNOSIS — N920 Excessive and frequent menstruation with regular cycle: Secondary | ICD-10-CM | POA: Diagnosis not present

## 2022-06-11 DIAGNOSIS — E782 Mixed hyperlipidemia: Secondary | ICD-10-CM | POA: Diagnosis not present

## 2022-06-11 DIAGNOSIS — R202 Paresthesia of skin: Secondary | ICD-10-CM | POA: Diagnosis not present

## 2022-06-11 DIAGNOSIS — Z87891 Personal history of nicotine dependence: Secondary | ICD-10-CM | POA: Diagnosis not present

## 2022-06-11 DIAGNOSIS — I10 Essential (primary) hypertension: Secondary | ICD-10-CM | POA: Diagnosis not present

## 2022-06-14 DIAGNOSIS — F411 Generalized anxiety disorder: Secondary | ICD-10-CM | POA: Diagnosis not present

## 2022-06-14 DIAGNOSIS — F330 Major depressive disorder, recurrent, mild: Secondary | ICD-10-CM | POA: Diagnosis not present

## 2022-06-28 DIAGNOSIS — F411 Generalized anxiety disorder: Secondary | ICD-10-CM | POA: Diagnosis not present

## 2022-06-28 DIAGNOSIS — F330 Major depressive disorder, recurrent, mild: Secondary | ICD-10-CM | POA: Diagnosis not present

## 2022-07-15 DIAGNOSIS — F411 Generalized anxiety disorder: Secondary | ICD-10-CM | POA: Diagnosis not present

## 2022-07-15 DIAGNOSIS — F330 Major depressive disorder, recurrent, mild: Secondary | ICD-10-CM | POA: Diagnosis not present

## 2022-07-19 DIAGNOSIS — D5 Iron 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): Secondary | ICD-10-CM | POA: Diagnosis not present

## 2022-07-19 DIAGNOSIS — E782 Mixed hyperlipidemia: Secondary | ICD-10-CM | POA: Diagnosis not present

## 2022-07-19 DIAGNOSIS — N920 Excessive and frequent menstruation with regular cycle: Secondary | ICD-10-CM | POA: Diagnosis not present

## 2022-07-19 DIAGNOSIS — I10 Essential (primary) hypertension: Secondary | ICD-10-CM | POA: Diagnosis not present

## 2022-07-19 DIAGNOSIS — Z87891 Personal history of nicotine dependence: Secondary | ICD-10-CM | POA: Diagnosis not present

## 2022-07-19 DIAGNOSIS — R202 Paresthesia of skin: Secondary | ICD-10-CM | POA: Diagnosis not present

## 2022-07-31 DIAGNOSIS — F411 Generalized anxiety disorder: Secondary | ICD-10-CM | POA: Diagnosis not present

## 2022-07-31 DIAGNOSIS — F330 Major depressive disorder, recurrent, mild: Secondary | ICD-10-CM | POA: Diagnosis not present

## 2022-08-02 ENCOUNTER — Encounter: Payer: Medicare Other | Admitting: Podiatry

## 2022-08-30 ENCOUNTER — Encounter: Payer: Medicare Other | Admitting: Podiatry

## 2022-09-02 ENCOUNTER — Encounter: Payer: Self-pay | Admitting: Hematology and Oncology

## 2022-09-06 ENCOUNTER — Encounter: Payer: Medicare Other | Admitting: Podiatry

## 2022-09-24 ENCOUNTER — Encounter: Payer: Self-pay | Admitting: Hematology and Oncology

## 2022-09-30 DIAGNOSIS — F411 Generalized anxiety disorder: Secondary | ICD-10-CM | POA: Diagnosis not present

## 2022-09-30 DIAGNOSIS — F330 Major depressive disorder, recurrent, mild: Secondary | ICD-10-CM | POA: Diagnosis not present

## 2022-10-01 DIAGNOSIS — R509 Fever, unspecified: Secondary | ICD-10-CM | POA: Diagnosis not present

## 2022-10-01 DIAGNOSIS — U071 COVID-19: Secondary | ICD-10-CM | POA: Diagnosis not present

## 2022-10-08 ENCOUNTER — Encounter: Payer: Medicare Other | Admitting: Podiatry

## 2022-10-15 DIAGNOSIS — D5 Iron 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): Secondary | ICD-10-CM | POA: Diagnosis not present

## 2022-10-15 DIAGNOSIS — E782 Mixed hyperlipidemia: Secondary | ICD-10-CM | POA: Diagnosis not present

## 2022-10-15 DIAGNOSIS — Z87891 Personal history of nicotine dependence: Secondary | ICD-10-CM | POA: Diagnosis not present

## 2022-10-15 DIAGNOSIS — I10 Essential (primary) hypertension: Secondary | ICD-10-CM | POA: Diagnosis not present

## 2022-10-15 DIAGNOSIS — N920 Excessive and frequent menstruation with regular cycle: Secondary | ICD-10-CM | POA: Diagnosis not present

## 2022-10-15 DIAGNOSIS — R202 Paresthesia of skin: Secondary | ICD-10-CM | POA: Diagnosis not present

## 2022-10-22 ENCOUNTER — Ambulatory Visit (INDEPENDENT_AMBULATORY_CARE_PROVIDER_SITE_OTHER): Payer: Medicare Other | Admitting: Podiatry

## 2022-10-22 ENCOUNTER — Ambulatory Visit (INDEPENDENT_AMBULATORY_CARE_PROVIDER_SITE_OTHER): Payer: Medicare Other

## 2022-10-22 DIAGNOSIS — M960 Pseudarthrosis after fusion or arthrodesis: Secondary | ICD-10-CM

## 2022-10-22 NOTE — Progress Notes (Signed)
Subjective:  Patient ID: Michelle Bell, female    DOB: 01/21/1976,  MRN: [id number]  Chief Complaint  Patient presents with   Routine Post Op    POV #3 DOS 10/29/2021 LT TALONAVICULAR JOINT FUSION W/SUBTARLAR JOINT FUSION    47 y.o. female presents with the above complaint.  Patient was once with follow-up from left talonavicular joint fusion with subtalar joint fusion on 10/29/2021.  She states this was the biggest causing her pain.  She just wanted to get it evaluated make sure everything is okay.  She has not seen me in the last 6 months.  She denies any other acute issues.   Review of Systems: Negative except as noted in the HPI. Denies N/V/F/Ch.  Past Medical History:  Diagnosis Date   Anxiety    Depression    Hyperlipidemia, mixed    Hypertension    Iron 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)    previously had Iron infusion's by dr Lindi Adie   Menorrhagia with regular cycle    OA (osteoarthritis)     Current Outpatient Medications:    acetaminophen (TYLENOL) 325 MG tablet, Take 325 mg by mouth daily as needed for moderate pain. (Patient not taking: Reported on 12/28/2021), Disp: , Rfl:    amLODipine (NORVASC) 5 MG tablet, Take 5 mg by mouth daily., Disp: , Rfl:    aspirin EC 81 MG tablet, Take 81 mg by mouth daily. (Patient not taking: Reported on 12/28/2021), Disp: , Rfl:    diclofenac Sodium (VOLTAREN) 1 % GEL, Apply topically 4 (four) times daily. (Patient not taking: Reported on 12/28/2021), Disp: , Rfl:    ferrous sulfate 325 (65 FE) MG tablet, Take 325 mg by mouth daily with breakfast. (Patient not taking: Reported on 12/28/2021), Disp: , Rfl:    gabapentin (NEURONTIN) 300 MG capsule, Take 300 mg by mouth at bedtime. (Patient not taking: Reported on 12/28/2021), Disp: , Rfl:    hydrochlorothiazide (HYDRODIURIL) 25 MG tablet, Take 25 mg by mouth daily. (Patient not taking: Reported on 10/29/2021), Disp: , Rfl:    ibuprofen (ADVIL) 800 MG tablet, Take 1 tablet (800 mg  total) by mouth every 6 (six) hours as needed., Disp: 60 tablet, Rfl: 1   ibuprofen (ADVIL,MOTRIN) 200 MG tablet, Take 200 mg by mouth daily as needed for headache or moderate pain. (Patient not taking: Reported on 12/28/2021), Disp: , Rfl:    meloxicam (MOBIC) 7.5 MG tablet, Take 7.5 mg by mouth daily. (Patient not taking: Reported on 10/29/2021), Disp: , Rfl:    Multiple Vitamin (MULTIVITAMIN WITH MINERALS) TABS tablet, Take 1 tablet by mouth daily. (Patient not taking: Reported on 12/28/2021), Disp: , Rfl:    nitroGLYCERIN (NITROGLYN) 2 % ointment, Apply 0.5 inches topically every 6 (six) hours. (Patient not taking: Reported on 02/27/2021), Disp: 30 g, Rfl: 0   ondansetron (ZOFRAN) 4 MG tablet, Take 1 tablet (4 mg total) by mouth every 8 (eight) hours as needed for nausea or vomiting. (Patient not taking: Reported on 12/28/2021), Disp: 20 tablet, Rfl: 0   oxyCODONE-acetaminophen (PERCOCET) 5-325 MG tablet, Take 1 tablet by mouth every 4 (four) hours as needed for severe pain. (Patient not taking: Reported on 12/28/2021), Disp: 30 tablet, Rfl: 0   oxyCODONE-acetaminophen (PERCOCET) 5-325 MG tablet, Take 1 tablet by mouth every 4 (four) hours as needed for severe pain. (Patient not taking: Reported on 12/28/2021), Disp: 30 tablet, Rfl: 0  Social History   Tobacco Use  Smoking Status Never  Smokeless Tobacco Never  No Known Allergies Objective:  There were no vitals filed for this visit. There is no height or weight on file to calculate BMI. Constitutional Well developed. Well nourished.  Vascular Dorsalis pedis pulses palpable bilaterally. Posterior tibial pulses palpable bilaterally. Capillary refill normal to all digits.  No cyanosis or clubbing noted. Pedal hair growth normal.  Neurologic Normal speech. Oriented to person, place, and time. Epicritic sensation to light touch grossly present bilaterally.  Dermatologic Nails well groomed and normal in appearance. No open wounds. No skin  lesions.  Orthopedic: No pain on palpation to the incision or hardware.  No pain at the talonavicular joint or subtalar joint.   Radiographs: 3 views of skeletally mature the left foot: Hardware is intact no signs of backing or loosening.  Nonunion noted across the TN and subtalar joint.  Some bony bridging noted. Assessment:   1. Nonunion after arthrodesis    Plan:  Patient was evaluated and treated and all questions answered.  Left subtalar joint/talonavicular joint nonunion -All questions and concerns were discussed with the patient in extensive detail -Clinically patient is not having pain and she is functioning well.  I encouraged compression socks for swelling.  However for now given the findings of nonunion patient will benefit from bone stimulator to help with fusion.  If there is no improvement we need to do CT scan and revisional surgery.  No follow-ups on file.   Left subtalar joint/talonavicular joint nonunion bone stimulator

## 2022-11-05 DIAGNOSIS — F900 Attention-deficit hyperactivity disorder, predominantly inattentive type: Secondary | ICD-10-CM | POA: Diagnosis not present

## 2022-11-05 DIAGNOSIS — F330 Major depressive disorder, recurrent, mild: Secondary | ICD-10-CM | POA: Diagnosis not present

## 2022-11-05 DIAGNOSIS — F411 Generalized anxiety disorder: Secondary | ICD-10-CM | POA: Diagnosis not present

## 2022-11-17 ENCOUNTER — Emergency Department (HOSPITAL_COMMUNITY)
Admission: EM | Admit: 2022-11-17 | Discharge: 2022-11-17 | Disposition: A | Discharge status: Home / Self Care | Payer: Medicare Other | Attending: Emergency Medicine | Admitting: Emergency Medicine

## 2022-11-17 ENCOUNTER — Other Ambulatory Visit: Payer: Self-pay

## 2022-11-17 ENCOUNTER — Emergency Department (HOSPITAL_COMMUNITY): Payer: Medicare Other

## 2022-11-17 ENCOUNTER — Encounter (HOSPITAL_COMMUNITY): Payer: Self-pay | Admitting: *Deleted

## 2022-11-17 DIAGNOSIS — R519 Headache, unspecified: Secondary | ICD-10-CM | POA: Diagnosis not present

## 2022-11-17 LAB — CBC
HCT: 37.8 % (ref 36.0–46.0)
Hemoglobin: 12.5 g/dL (ref 12.0–15.0)
MCH: 28.9 pg (ref 26.0–34.0)
MCHC: 33.1 g/dL (ref 30.0–36.0)
MCV: 87.3 fL (ref 80.0–100.0)
Platelets: 412 10*3/uL — ABNORMAL HIGH (ref 150–400)
RBC: 4.33 MIL/uL (ref 3.87–5.11)
RDW: 17.2 % — ABNORMAL HIGH (ref 11.5–15.5)
WBC: 8.1 10*3/uL (ref 4.0–10.5)
nRBC: 0 % (ref 0.0–0.2)

## 2022-11-17 LAB — BASIC METABOLIC PANEL
Anion gap: 11 (ref 5–15)
BUN: 6 mg/dL (ref 6–20)
CO2: 25 mmol/L (ref 22–32)
Calcium: 9.2 mg/dL (ref 8.9–10.3)
Chloride: 100 mmol/L (ref 98–111)
Creatinine, Ser: 0.6 mg/dL (ref 0.44–1.00)
GFR, Estimated: >60 mL/min (ref >60)
Glucose, Bld: 98 mg/dL (ref 70–99)
Potassium: 3.8 mmol/L (ref 3.5–5.1)
Sodium: 136 mmol/L (ref 135–145)

## 2022-11-17 LAB — I-STAT BETA HCG BLOOD, ED (MC, WL, AP ONLY): I-stat hCG, quantitative: <5 m[IU]/mL (ref <5)

## 2022-11-17 MED ORDER — KETOROLAC TROMETHAMINE 30 MG/ML IJ SOLN
30.0000 mg | Freq: Once | INTRAMUSCULAR | Status: AC
  Administered 2022-11-17: 30 mg via INTRAVENOUS
  Filled 2022-11-17: qty 1

## 2022-11-17 MED ORDER — DIPHENHYDRAMINE HCL 50 MG/ML IJ SOLN
25.0000 mg | Freq: Once | INTRAMUSCULAR | Status: AC
  Administered 2022-11-17: 25 mg via INTRAVENOUS
  Filled 2022-11-17: qty 1

## 2022-11-17 MED ORDER — METOCLOPRAMIDE HCL 5 MG/ML IJ SOLN
10.0000 mg | Freq: Once | INTRAMUSCULAR | Status: AC
Start: 2022-11-17 — End: 2022-11-17
  Administered 2022-11-17: 10 mg via INTRAVENOUS
  Filled 2022-11-17: qty 2

## 2022-11-17 MED ORDER — SODIUM CHLORIDE 0.9 % IV BOLUS
500.0000 mL | Freq: Once | INTRAVENOUS | Status: AC
  Administered 2022-11-17: 500 mL via INTRAVENOUS

## 2022-11-17 NOTE — ED Provider Notes (Signed)
[facility] Emergency Department Provider Note MRN:  [id number]  Arrival date & time: 11/17/22     Chief Complaint   Headache   History of Present Illness   Michelle Bell is a 47 y.o. year-old female presents to the ED with chief complaint of head pressure/headache for the past week.  She states that it feels like she has had a "helmet on her head and feels pressure."  She denies fever or chills.  Denies any treatment PTA.  Denies numbness, weakness, or tingling.  History provided by patient.   Review of Systems  Pertinent positive and negative review of systems noted in HPI.    Physical Exam   Vitals:   11/17/22 0235  BP: (!) 168/103  Pulse: 92  Resp: 20  Temp: 97.9 F (36.6 C)  SpO2: 97%    CONSTITUTIONAL:  well-appearing, NAD NEURO:  Alert and oriented x 3, CN 3-12 grossly intact, normal finger to nose, no pronator drift EYES:  eyes equal and reactive ENT/NECK:  Supple, no stridor  CARDIO:  normal rate, regular rhythm, appears well-perfused  PULM:  No respiratory distress,  GI/GU:  non-distended,  MSK/SPINE:  No gross deformities, no edema, moves all extremities  SKIN:  no rash, atraumatic   *Additional and/or pertinent findings included in MDM below  Diagnostic and Interventional Summary    EKG Interpretation  Date/Time:    Ventricular Rate:    PR Interval:    QRS Duration:   QT Interval:    QTC Calculation:   R Axis:     Text Interpretation:         Labs Reviewed  CBC - Abnormal; Notable for the following components:      Result Value   RDW 17.2 (*)    Platelets 412 (*)    All other components within normal limits  BASIC METABOLIC PANEL  I-STAT BETA HCG BLOOD, ED ([facility], [facility], [facility] ONLY)    CT HEAD WO CONTRAST (5MM)  Final Result      Medications  ketorolac (TORADOL) 30 MG/ML injection 30 mg (30 mg Intravenous Given 11/17/22 0448)  metoCLOPramide (REGLAN) injection 10 mg (10 mg Intravenous Given 11/17/22 0449)   diphenhydrAMINE (BENADRYL) injection 25 mg (25 mg Intravenous Given 11/17/22 0448)  sodium chloride 0.9 % bolus 500 mL (500 mLs Intravenous New Bag/Given 11/17/22 0323)     Procedures  /  Critical Care Procedures  ED Course and Medical Decision Making  I have reviewed the triage vital signs, the nursing notes, and pertinent available records from the EMR.  Social Determinants Affecting Complexity of Care: Patient has no clinically significant social determinants affecting this chief complaint..   ED Course:    Medical Decision Making Patient here with headache for the past week.  Neuro intact.  No fever.  No neck stiffness. Doubt meningitis.  Not sudden onset, doubt SAH.    Headache resolved after headache cocktail.  Amount and/or Complexity of Data Reviewed Labs: ordered.    Details: No significant lab abnormality Radiology: ordered and independent interpretation performed.    Details: No obvious mass or bleed  Risk Prescription drug management.     Consultants: No consultations were needed in caring for this patient.   Treatment and Plan: Emergency department workup does not suggest an emergent condition requiring admission or immediate intervention beyond  what has been performed at this time. The patient is safe for discharge and has  been instructed to return immediately for worsening symptoms, change in  symptoms or any other concerns    Final Clinical Impressions(s) / ED Diagnoses     ICD-10-CM   1. Nonintractable headache, unspecified chronicity pattern, unspecified headache type  R51.9       ED Discharge Orders     None         Discharge Instructions Discussed with and Provided to Patient:     Discharge Instructions      Your CT scan and blood work looked good.  Please follow-up with your regular doctor.       Montine Circle, PA-C 11/17/22 [id number]    Fatima Blank, MD 11/17/22 [phone]

## 2022-11-17 NOTE — Discharge Instructions (Signed)
Your CT scan and blood work looked good.  Please follow-up with your regular doctor.

## 2022-11-17 NOTE — ED Triage Notes (Signed)
The pt is c/o a headache for one week  no n or v no visual disturbances no previous history  lmp one year

## 2022-11-19 DIAGNOSIS — F330 Major depressive disorder, recurrent, mild: Secondary | ICD-10-CM | POA: Diagnosis not present

## 2022-11-19 DIAGNOSIS — F411 Generalized anxiety disorder: Secondary | ICD-10-CM | POA: Diagnosis not present

## 2022-11-22 ENCOUNTER — Telehealth: Payer: Self-pay

## 2022-11-22 NOTE — Telephone Encounter (Signed)
        Patient  visited [facility] on 11/17/2022  for headache.   Telephone encounter attempt :  1st  A HIPAA compliant voice message was left requesting a return call.  Instructed patient to call back at [phone].   [place] Resource Care Guide   [email or web address]  ?? [phone]   Website: [email or web address]  [email or web address]

## 2022-11-25 ENCOUNTER — Telehealth: Payer: Self-pay

## 2022-11-25 NOTE — Telephone Encounter (Signed)
        Patient  visited [facility] on 11/17/2022  for headache.   Telephone encounter attempt :  2nd  A HIPAA compliant voice message was left requesting a return call.  Instructed patient to call back at [phone].   [place] Resource Care Guide   [email or web address]  ?? [id number]   Website: [email or web address]  [email or web address]

## 2022-11-26 ENCOUNTER — Telehealth: Payer: Self-pay

## 2022-11-26 NOTE — Telephone Encounter (Signed)
        Patient  visited [facility] [place] on 11/17/2022  for headache.   Telephone encounter attempt :  3rd  A HIPAA compliant voice message was left requesting a return call.  Instructed patient to call back at [phone].   [place] Resource Care Guide   [email or web address]  ?? [phone]   Website: [email or web address]  [email or web address]

## 2022-12-17 ENCOUNTER — Other Ambulatory Visit: Payer: Self-pay | Admitting: Physician Assistant

## 2022-12-17 DIAGNOSIS — Z13220 Encounter for screening for lipoid disorders: Secondary | ICD-10-CM | POA: Diagnosis not present

## 2022-12-17 DIAGNOSIS — R202 Paresthesia of skin: Secondary | ICD-10-CM | POA: Diagnosis not present

## 2022-12-17 DIAGNOSIS — I10 Essential (primary) hypertension: Secondary | ICD-10-CM | POA: Diagnosis not present

## 2022-12-17 DIAGNOSIS — Z87891 Personal history of nicotine dependence: Secondary | ICD-10-CM | POA: Diagnosis not present

## 2022-12-17 DIAGNOSIS — Z1231 Encounter for screening mammogram for malignant neoplasm of breast: Secondary | ICD-10-CM

## 2022-12-17 DIAGNOSIS — R635 Abnormal weight gain: Secondary | ICD-10-CM | POA: Diagnosis not present

## 2022-12-17 DIAGNOSIS — N920 Excessive and frequent menstruation with regular cycle: Secondary | ICD-10-CM | POA: Diagnosis not present

## 2022-12-17 DIAGNOSIS — D5 Iron 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): Secondary | ICD-10-CM | POA: Diagnosis not present

## 2022-12-17 DIAGNOSIS — E782 Mixed hyperlipidemia: Secondary | ICD-10-CM | POA: Diagnosis not present

## 2023-01-06 DIAGNOSIS — F330 Major depressive disorder, recurrent, mild: Secondary | ICD-10-CM | POA: Diagnosis not present

## 2023-01-06 DIAGNOSIS — F411 Generalized anxiety disorder: Secondary | ICD-10-CM | POA: Diagnosis not present

## 2023-01-06 DIAGNOSIS — F900 Attention-deficit hyperactivity disorder, predominantly inattentive type: Secondary | ICD-10-CM | POA: Diagnosis not present

## 2023-01-21 DIAGNOSIS — F330 Major depressive disorder, recurrent, mild: Secondary | ICD-10-CM | POA: Diagnosis not present

## 2023-01-21 DIAGNOSIS — F411 Generalized anxiety disorder: Secondary | ICD-10-CM | POA: Diagnosis not present

## 2023-01-22 ENCOUNTER — Ambulatory Visit: Payer: Medicare Other | Admitting: Podiatry

## 2023-02-21 ENCOUNTER — Ambulatory Visit
Admission: RE | Admit: 2023-02-21 | Discharge: 2023-02-21 | Disposition: A | Discharge status: Home / Self Care | Payer: Medicare Other | Source: Ambulatory Visit | Attending: Physician Assistant | Admitting: Physician Assistant

## 2023-02-21 ENCOUNTER — Ambulatory Visit: Payer: Medicare Other

## 2023-02-21 DIAGNOSIS — Z1231 Encounter for screening mammogram for malignant neoplasm of breast: Secondary | ICD-10-CM | POA: Diagnosis not present

## 2023-02-25 DIAGNOSIS — I10 Essential (primary) hypertension: Secondary | ICD-10-CM | POA: Diagnosis not present

## 2023-02-25 DIAGNOSIS — Z87891 Personal history of nicotine dependence: Secondary | ICD-10-CM | POA: Diagnosis not present

## 2023-02-25 DIAGNOSIS — R202 Paresthesia of skin: Secondary | ICD-10-CM | POA: Diagnosis not present

## 2023-02-25 DIAGNOSIS — E782 Mixed hyperlipidemia: Secondary | ICD-10-CM | POA: Diagnosis not present

## 2023-02-25 DIAGNOSIS — D5 Iron 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): Secondary | ICD-10-CM | POA: Diagnosis not present

## 2023-02-25 DIAGNOSIS — R635 Abnormal weight gain: Secondary | ICD-10-CM | POA: Diagnosis not present

## 2023-02-25 DIAGNOSIS — N920 Excessive and frequent menstruation with regular cycle: Secondary | ICD-10-CM | POA: Diagnosis not present

## 2023-02-25 DIAGNOSIS — Z0001 Encounter for general adult medical examination with abnormal findings: Secondary | ICD-10-CM | POA: Diagnosis not present

## 2023-02-27 DIAGNOSIS — F411 Generalized anxiety disorder: Secondary | ICD-10-CM | POA: Diagnosis not present

## 2023-02-27 DIAGNOSIS — F330 Major depressive disorder, recurrent, mild: Secondary | ICD-10-CM | POA: Diagnosis not present

## 2023-03-12 ENCOUNTER — Ambulatory Visit: Payer: Medicare Other | Admitting: Podiatry

## 2023-03-27 DIAGNOSIS — F330 Major depressive disorder, recurrent, mild: Secondary | ICD-10-CM | POA: Diagnosis not present

## 2023-03-27 DIAGNOSIS — F411 Generalized anxiety disorder: Secondary | ICD-10-CM | POA: Diagnosis not present

## 2023-04-29 DIAGNOSIS — F411 Generalized anxiety disorder: Secondary | ICD-10-CM | POA: Diagnosis not present

## 2023-04-29 DIAGNOSIS — F330 Major depressive disorder, recurrent, mild: Secondary | ICD-10-CM | POA: Diagnosis not present

## 2023-05-29 DIAGNOSIS — D5 Iron 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): Secondary | ICD-10-CM | POA: Diagnosis not present

## 2023-05-29 DIAGNOSIS — Z6836 Body mass index (BMI) 36.0-36.9, adult: Secondary | ICD-10-CM | POA: Diagnosis not present

## 2023-05-29 DIAGNOSIS — I10 Essential (primary) hypertension: Secondary | ICD-10-CM | POA: Diagnosis not present

## 2023-05-29 DIAGNOSIS — E782 Mixed hyperlipidemia: Secondary | ICD-10-CM | POA: Diagnosis not present

## 2023-05-29 DIAGNOSIS — R202 Paresthesia of skin: Secondary | ICD-10-CM | POA: Diagnosis not present

## 2023-06-12 DIAGNOSIS — F411 Generalized anxiety disorder: Secondary | ICD-10-CM | POA: Diagnosis not present

## 2023-06-12 DIAGNOSIS — F330 Major depressive disorder, recurrent, mild: Secondary | ICD-10-CM | POA: Diagnosis not present

## 2023-07-01 DIAGNOSIS — F330 Major depressive disorder, recurrent, mild: Secondary | ICD-10-CM | POA: Diagnosis not present

## 2023-07-01 DIAGNOSIS — F411 Generalized anxiety disorder: Secondary | ICD-10-CM | POA: Diagnosis not present

## 2023-08-18 DIAGNOSIS — F411 Generalized anxiety disorder: Secondary | ICD-10-CM | POA: Diagnosis not present

## 2023-08-18 DIAGNOSIS — F900 Attention-deficit hyperactivity disorder, predominantly inattentive type: Secondary | ICD-10-CM | POA: Diagnosis not present

## 2023-08-19 DIAGNOSIS — E782 Mixed hyperlipidemia: Secondary | ICD-10-CM | POA: Diagnosis not present

## 2023-08-19 DIAGNOSIS — J0100 Acute maxillary sinusitis, unspecified: Secondary | ICD-10-CM | POA: Diagnosis not present

## 2023-08-19 DIAGNOSIS — Z6836 Body mass index (BMI) 36.0-36.9, adult: Secondary | ICD-10-CM | POA: Diagnosis not present

## 2023-08-19 DIAGNOSIS — D5 Iron 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): Secondary | ICD-10-CM | POA: Diagnosis not present

## 2023-08-19 DIAGNOSIS — R739 Hyperglycemia, unspecified: Secondary | ICD-10-CM | POA: Diagnosis not present

## 2023-08-19 DIAGNOSIS — R202 Paresthesia of skin: Secondary | ICD-10-CM | POA: Diagnosis not present

## 2023-08-19 DIAGNOSIS — I10 Essential (primary) hypertension: Secondary | ICD-10-CM | POA: Diagnosis not present

## 2023-10-22 DIAGNOSIS — F411 Generalized anxiety disorder: Secondary | ICD-10-CM | POA: Diagnosis not present

## 2023-10-22 DIAGNOSIS — F330 Major depressive disorder, recurrent, mild: Secondary | ICD-10-CM | POA: Diagnosis not present

## 2023-10-29 DIAGNOSIS — F411 Generalized anxiety disorder: Secondary | ICD-10-CM | POA: Diagnosis not present

## 2023-10-29 DIAGNOSIS — F330 Major depressive disorder, recurrent, mild: Secondary | ICD-10-CM | POA: Diagnosis not present

## 2023-11-17 DIAGNOSIS — Z6836 Body mass index (BMI) 36.0-36.9, adult: Secondary | ICD-10-CM | POA: Diagnosis not present

## 2023-11-17 DIAGNOSIS — R202 Paresthesia of skin: Secondary | ICD-10-CM | POA: Diagnosis not present

## 2023-11-17 DIAGNOSIS — R771 Abnormality of globulin: Secondary | ICD-10-CM | POA: Diagnosis not present

## 2023-11-17 DIAGNOSIS — E782 Mixed hyperlipidemia: Secondary | ICD-10-CM | POA: Diagnosis not present

## 2023-11-17 DIAGNOSIS — D5 Iron 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): Secondary | ICD-10-CM | POA: Diagnosis not present

## 2023-11-17 DIAGNOSIS — I10 Essential (primary) hypertension: Secondary | ICD-10-CM | POA: Diagnosis not present

## 2023-12-08 DIAGNOSIS — F411 Generalized anxiety disorder: Secondary | ICD-10-CM | POA: Diagnosis not present

## 2023-12-14 ENCOUNTER — Emergency Department (HOSPITAL_COMMUNITY)

## 2023-12-14 ENCOUNTER — Encounter: Payer: Self-pay | Admitting: Hematology and Oncology

## 2023-12-14 ENCOUNTER — Emergency Department (HOSPITAL_COMMUNITY)
Admission: EM | Admit: 2023-12-14 | Discharge: 2023-12-14 | Disposition: A | Discharge status: Home / Self Care | Attending: Emergency Medicine | Admitting: Emergency Medicine

## 2023-12-14 ENCOUNTER — Other Ambulatory Visit: Payer: Self-pay

## 2023-12-14 ENCOUNTER — Other Ambulatory Visit (HOSPITAL_COMMUNITY)

## 2023-12-14 DIAGNOSIS — S62320A Displaced fracture of shaft of second metacarpal bone, right hand, initial encounter for closed fracture: Secondary | ICD-10-CM | POA: Diagnosis not present

## 2023-12-14 DIAGNOSIS — S52501A Unspecified fracture of the lower end of right radius, initial encounter for closed fracture: Secondary | ICD-10-CM | POA: Diagnosis not present

## 2023-12-14 DIAGNOSIS — R519 Headache, unspecified: Secondary | ICD-10-CM | POA: Diagnosis not present

## 2023-12-14 DIAGNOSIS — S52551A Other extraarticular fracture of lower end of right radius, initial encounter for closed fracture: Secondary | ICD-10-CM | POA: Diagnosis not present

## 2023-12-14 DIAGNOSIS — M25531 Pain in right wrist: Secondary | ICD-10-CM | POA: Diagnosis not present

## 2023-12-14 DIAGNOSIS — R079 Chest pain, unspecified: Secondary | ICD-10-CM | POA: Diagnosis not present

## 2023-12-14 DIAGNOSIS — I10 Essential (primary) hypertension: Secondary | ICD-10-CM | POA: Diagnosis not present

## 2023-12-14 DIAGNOSIS — S62336A Displaced fracture of neck of fifth metacarpal bone, right hand, initial encounter for closed fracture: Secondary | ICD-10-CM | POA: Insufficient documentation

## 2023-12-14 DIAGNOSIS — S62330A Displaced fracture of neck of second metacarpal bone, right hand, initial encounter for closed fracture: Secondary | ICD-10-CM | POA: Diagnosis not present

## 2023-12-14 DIAGNOSIS — S299XXA Unspecified injury of thorax, initial encounter: Secondary | ICD-10-CM | POA: Diagnosis not present

## 2023-12-14 DIAGNOSIS — S3993XA Unspecified injury of pelvis, initial encounter: Secondary | ICD-10-CM | POA: Diagnosis not present

## 2023-12-14 DIAGNOSIS — Y92410 Unspecified street and highway as the place of occurrence of the external cause: Secondary | ICD-10-CM | POA: Insufficient documentation

## 2023-12-14 DIAGNOSIS — S6991XA Unspecified injury of right wrist, hand and finger(s), initial encounter: Secondary | ICD-10-CM | POA: Diagnosis present

## 2023-12-14 DIAGNOSIS — M542 Cervicalgia: Secondary | ICD-10-CM | POA: Diagnosis not present

## 2023-12-14 DIAGNOSIS — R000 Tachycardia, unspecified: Secondary | ICD-10-CM | POA: Diagnosis not present

## 2023-12-14 DIAGNOSIS — M79641 Pain in right hand: Secondary | ICD-10-CM | POA: Insufficient documentation

## 2023-12-14 DIAGNOSIS — Z79899 Other long term (current) drug therapy: Secondary | ICD-10-CM | POA: Diagnosis not present

## 2023-12-14 DIAGNOSIS — S62326A Displaced fracture of shaft of fifth metacarpal bone, right hand, initial encounter for closed fracture: Secondary | ICD-10-CM | POA: Diagnosis not present

## 2023-12-14 DIAGNOSIS — S62300A Unspecified fracture of second metacarpal bone, right hand, initial encounter for closed fracture: Secondary | ICD-10-CM

## 2023-12-14 DIAGNOSIS — S59201A Unspecified physeal fracture of lower end of radius, right arm, initial encounter for closed fracture: Secondary | ICD-10-CM | POA: Diagnosis not present

## 2023-12-14 DIAGNOSIS — R9389 Abnormal findings on diagnostic imaging of other specified body structures: Secondary | ICD-10-CM | POA: Diagnosis not present

## 2023-12-14 DIAGNOSIS — S79921A Unspecified injury of right thigh, initial encounter: Secondary | ICD-10-CM | POA: Diagnosis not present

## 2023-12-14 LAB — CBC WITH DIFFERENTIAL/PLATELET
Abs Immature Granulocytes: 0.08 10*3/uL — ABNORMAL HIGH (ref 0.00–0.07)
Basophils Absolute: 0.1 10*3/uL (ref 0.0–0.1)
Basophils Relative: 1 %
Eosinophils Absolute: 0.4 10*3/uL (ref 0.0–0.5)
Eosinophils Relative: 4 %
HCT: 39.8 % (ref 36.0–46.0)
Hemoglobin: 12.7 g/dL (ref 12.0–15.0)
Immature Granulocytes: 1 %
Lymphocytes Relative: 31 %
Lymphs Abs: 3.1 10*3/uL (ref 0.7–4.0)
MCH: 28.8 pg (ref 26.0–34.0)
MCHC: 31.9 g/dL (ref 30.0–36.0)
MCV: 90.2 fL (ref 80.0–100.0)
Monocytes Absolute: 0.7 10*3/uL (ref 0.1–1.0)
Monocytes Relative: 7 %
Neutro Abs: 5.7 10*3/uL (ref 1.7–7.7)
Neutrophils Relative %: 56 %
Platelets: 423 10*3/uL — ABNORMAL HIGH (ref 150–400)
RBC: 4.41 MIL/uL (ref 3.87–5.11)
RDW: 14 % (ref 11.5–15.5)
WBC: 10 10*3/uL (ref 4.0–10.5)
nRBC: 0 % (ref 0.0–0.2)

## 2023-12-14 LAB — BASIC METABOLIC PANEL WITH GFR
Anion gap: 8 (ref 5–15)
BUN: 9 mg/dL (ref 6–20)
CO2: 24 mmol/L (ref 22–32)
Calcium: 9.5 mg/dL (ref 8.9–10.3)
Chloride: 105 mmol/L (ref 98–111)
Creatinine, Ser: 0.57 mg/dL (ref 0.44–1.00)
GFR, Estimated: >60 mL/min (ref >60)
Glucose, Bld: 117 mg/dL — ABNORMAL HIGH (ref 70–99)
Potassium: 3.7 mmol/L (ref 3.5–5.1)
Sodium: 137 mmol/L (ref 135–145)

## 2023-12-14 LAB — TROPONIN I (HIGH SENSITIVITY): Troponin I (High Sensitivity): 4 ng/L (ref <18)

## 2023-12-14 LAB — HCG, SERUM, QUALITATIVE: Preg, Serum: NEGATIVE

## 2023-12-14 MED ORDER — ACETAMINOPHEN 500 MG PO TABS
1000.0000 mg | ORAL_TABLET | Freq: Once | ORAL | Status: AC
  Administered 2023-12-14: 1000 mg via ORAL
  Filled 2023-12-14: qty 2

## 2023-12-14 MED ORDER — OXYCODONE-ACETAMINOPHEN 5-325 MG PO TABS: 1.0000 | ORAL_TABLET | Freq: Four times a day (QID) | ORAL | 0 refills | Status: DC | PRN

## 2023-12-14 MED ORDER — FENTANYL CITRATE PF 50 MCG/ML IJ SOSY
50.0000 ug | PREFILLED_SYRINGE | Freq: Once | INTRAMUSCULAR | Status: AC
  Administered 2023-12-14: 50 ug via INTRAVENOUS
  Filled 2023-12-14: qty 1

## 2023-12-14 MED ORDER — OXYCODONE-ACETAMINOPHEN 5-325 MG PO TABS: 1.0000 | ORAL_TABLET | Freq: Once | ORAL | Status: DC
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
## 2023-12-14 NOTE — ED Notes (Signed)
 Patient transported to CT scan .

## 2023-12-14 NOTE — Progress Notes (Signed)
 Orthopedic Tech Progress Note Patient Details:  Michelle Bell [birth year] [id number]  Ortho Devices Type of Ortho Device: Arm sling, Sugartong splint Ortho Device/Splint Location: rue Ortho Device/Splint Interventions: Ordered, Application, Adjustment  I applied the splint to the mcp joint with the drs permission. Post Interventions Patient Tolerated: Well Instructions Provided: Care of device, Adjustment of device  [name] 12/14/2023, 11:26 PM
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
## 2023-12-15 ENCOUNTER — Encounter: Payer: Self-pay | Admitting: Hematology and Oncology

## 2023-12-22 ENCOUNTER — Other Ambulatory Visit: Payer: Self-pay

## 2023-12-22 ENCOUNTER — Encounter (HOSPITAL_BASED_OUTPATIENT_CLINIC_OR_DEPARTMENT_OTHER): Payer: Self-pay | Admitting: Orthopedic Surgery

## 2023-12-22 ENCOUNTER — Other Ambulatory Visit: Payer: Self-pay | Admitting: Orthopedic Surgery

## 2023-12-22 DIAGNOSIS — S52501A Unspecified fracture of the lower end of right radius, initial encounter for closed fracture: Secondary | ICD-10-CM | POA: Diagnosis not present

## 2023-12-22 DIAGNOSIS — S62326A Displaced fracture of shaft of fifth metacarpal bone, right hand, initial encounter for closed fracture: Secondary | ICD-10-CM | POA: Diagnosis not present

## 2023-12-22 DIAGNOSIS — S62330A Displaced fracture of neck of second metacarpal bone, right hand, initial encounter for closed fracture: Secondary | ICD-10-CM | POA: Diagnosis not present

## 2023-12-23 ENCOUNTER — Ambulatory Visit (HOSPITAL_BASED_OUTPATIENT_CLINIC_OR_DEPARTMENT_OTHER)

## 2023-12-23 ENCOUNTER — Ambulatory Visit (HOSPITAL_BASED_OUTPATIENT_CLINIC_OR_DEPARTMENT_OTHER): Admitting: Anesthesiology

## 2023-12-23 ENCOUNTER — Encounter (HOSPITAL_BASED_OUTPATIENT_CLINIC_OR_DEPARTMENT_OTHER): Payer: Self-pay | Admitting: Orthopedic Surgery

## 2023-12-23 ENCOUNTER — Ambulatory Visit (HOSPITAL_BASED_OUTPATIENT_CLINIC_OR_DEPARTMENT_OTHER)
Admission: RE | Admit: 2023-12-23 | Discharge: 2023-12-23 | Disposition: A | Discharge status: Home / Self Care | Attending: Orthopedic Surgery | Admitting: Orthopedic Surgery

## 2023-12-23 ENCOUNTER — Other Ambulatory Visit: Payer: Self-pay

## 2023-12-23 ENCOUNTER — Encounter (HOSPITAL_BASED_OUTPATIENT_CLINIC_OR_DEPARTMENT_OTHER)
Admission: RE | Disposition: A | Discharge status: Home / Self Care | Payer: Self-pay | Source: Home / Self Care | Attending: Orthopedic Surgery

## 2023-12-23 DIAGNOSIS — S62320A Displaced fracture of shaft of second metacarpal bone, right hand, initial encounter for closed fracture: Secondary | ICD-10-CM | POA: Diagnosis not present

## 2023-12-23 DIAGNOSIS — M1990 Unspecified osteoarthritis, unspecified site: Secondary | ICD-10-CM | POA: Diagnosis not present

## 2023-12-23 DIAGNOSIS — S62326A Displaced fracture of shaft of fifth metacarpal bone, right hand, initial encounter for closed fracture: Secondary | ICD-10-CM | POA: Insufficient documentation

## 2023-12-23 DIAGNOSIS — Z79899 Other long term (current) drug therapy: Secondary | ICD-10-CM | POA: Insufficient documentation

## 2023-12-23 DIAGNOSIS — F32A Depression, unspecified: Secondary | ICD-10-CM | POA: Diagnosis not present

## 2023-12-23 DIAGNOSIS — Z6837 Body mass index (BMI) 37.0-37.9, adult: Secondary | ICD-10-CM | POA: Diagnosis not present

## 2023-12-23 DIAGNOSIS — F419 Anxiety disorder, unspecified: Secondary | ICD-10-CM | POA: Insufficient documentation

## 2023-12-23 DIAGNOSIS — S52501A Unspecified fracture of the lower end of right radius, initial encounter for closed fracture: Secondary | ICD-10-CM

## 2023-12-23 DIAGNOSIS — I10 Essential (primary) hypertension: Secondary | ICD-10-CM | POA: Insufficient documentation

## 2023-12-23 DIAGNOSIS — S52551A Other extraarticular fracture of lower end of right radius, initial encounter for closed fracture: Secondary | ICD-10-CM | POA: Diagnosis not present

## 2023-12-23 DIAGNOSIS — G8918 Other acute postprocedural pain: Secondary | ICD-10-CM | POA: Diagnosis not present

## 2023-12-23 DIAGNOSIS — E669 Obesity, unspecified: Secondary | ICD-10-CM | POA: Diagnosis not present

## 2023-12-23 DIAGNOSIS — S62300A Unspecified fracture of second metacarpal bone, right hand, initial encounter for closed fracture: Secondary | ICD-10-CM | POA: Diagnosis not present

## 2023-12-23 DIAGNOSIS — Z01818 Encounter for other preprocedural examination: Secondary | ICD-10-CM

## 2023-12-23 HISTORY — PX: OPEN REDUCTION INTERNAL FIXATION (ORIF) METACARPAL: SHX6234

## 2023-12-23 HISTORY — PX: CLOSED REDUCTION FINGER WITH PERCUTANEOUS PINNING: SHX5612

## 2023-12-23 LAB — POCT PREGNANCY, URINE: Preg Test, Ur: NEGATIVE

## 2023-12-23 SURGERY — CLOSED REDUCTION, FINGER, WITH PERCUTANEOUS PINNING
Anesthesia: General | Site: Finger | Laterality: Right

## 2023-12-23 MED ORDER — ACETAMINOPHEN 500 MG PO TABS
1000.0000 mg | ORAL_TABLET | Freq: Once | ORAL | Status: AC
  Administered 2023-12-23: 1000 mg via ORAL

## 2023-12-23 MED ORDER — PHENYLEPHRINE HCL-NACL 20-0.9 MG/250ML-% IV SOLN
INTRAVENOUS | Status: AC
  Filled 2023-12-23: qty 250

## 2023-12-23 MED ORDER — ROPIVACAINE HCL 5 MG/ML IJ SOLN
INTRAMUSCULAR | Status: DC | PRN
  Administered 2023-12-23: 30 mL via PERINEURAL

## 2023-12-23 MED ORDER — LIDOCAINE HCL (CARDIAC) PF 100 MG/5ML IV SOSY
PREFILLED_SYRINGE | INTRAVENOUS | Status: DC | PRN
  Administered 2023-12-23: 60 mg via INTRAVENOUS

## 2023-12-23 MED ORDER — CEFAZOLIN SODIUM-DEXTROSE 2-4 GM/100ML-% IV SOLN
2.0000 g | INTRAVENOUS | Status: AC
  Administered 2023-12-23: 2 g via INTRAVENOUS

## 2023-12-23 MED ORDER — OXYCODONE HCL 5 MG/5ML PO SOLN: 5.0000 mg | Freq: Once | ORAL | Status: DC | PRN

## 2023-12-23 MED ORDER — FENTANYL CITRATE (PF) 100 MCG/2ML IJ SOLN: 25.0000 ug | INTRAMUSCULAR | Status: DC | PRN

## 2023-12-23 MED ORDER — MIDAZOLAM HCL 2 MG/2ML IJ SOLN
2.0000 mg | Freq: Once | INTRAMUSCULAR | Status: AC
  Administered 2023-12-23: 2 mg via INTRAVENOUS

## 2023-12-23 MED ORDER — MIDAZOLAM HCL 2 MG/2ML IJ SOLN
INTRAMUSCULAR | Status: AC
  Filled 2023-12-23: qty 2

## 2023-12-23 MED ORDER — OXYCODONE HCL 5 MG PO TABS: 5.0000 mg | ORAL_TABLET | Freq: Once | ORAL | Status: DC | PRN

## 2023-12-23 MED ORDER — HYDROCODONE-ACETAMINOPHEN 5-325 MG PO TABS: 1.0000 | ORAL_TABLET | Freq: Four times a day (QID) | ORAL | 0 refills | Status: AC | PRN

## 2023-12-23 MED ORDER — CEFAZOLIN SODIUM-DEXTROSE 2-4 GM/100ML-% IV SOLN
INTRAVENOUS | Status: AC
  Filled 2023-12-23: qty 100

## 2023-12-23 MED ORDER — 0.9 % SODIUM CHLORIDE (POUR BTL) OPTIME
TOPICAL | Status: DC | PRN
  Administered 2023-12-23: 100 mL

## 2023-12-23 MED ORDER — ACETAMINOPHEN 500 MG PO TABS
ORAL_TABLET | ORAL | Status: AC
  Filled 2023-12-23: qty 2

## 2023-12-23 MED ORDER — LACTATED RINGERS IV SOLN: INTRAVENOUS | Status: DC

## 2023-12-23 MED ORDER — ONDANSETRON HCL 4 MG/2ML IJ SOLN
INTRAMUSCULAR | Status: DC | PRN
  Administered 2023-12-23: 4 mg via INTRAVENOUS

## 2023-12-23 MED ORDER — AMISULPRIDE (ANTIEMETIC) 5 MG/2ML IV SOLN: 10.0000 mg | Freq: Once | INTRAVENOUS | Status: DC | PRN

## 2023-12-23 MED ORDER — PHENYLEPHRINE HCL (PRESSORS) 10 MG/ML IV SOLN
INTRAVENOUS | Status: DC | PRN
  Administered 2023-12-23: 160 ug via INTRAVENOUS

## 2023-12-23 MED ORDER — DEXAMETHASONE SODIUM PHOSPHATE 4 MG/ML IJ SOLN
INTRAMUSCULAR | Status: DC | PRN
  Administered 2023-12-23: 8 mg via INTRAVENOUS

## 2023-12-23 MED ORDER — PHENYLEPHRINE HCL-NACL 20-0.9 MG/250ML-% IV SOLN
INTRAVENOUS | Status: DC | PRN
  Administered 2023-12-23: 40 ug/min via INTRAVENOUS

## 2023-12-23 MED ORDER — PROPOFOL 10 MG/ML IV BOLUS
INTRAVENOUS | Status: DC | PRN
  Administered 2023-12-23: 200 mg via INTRAVENOUS

## 2023-12-23 MED ORDER — FENTANYL CITRATE (PF) 100 MCG/2ML IJ SOLN
INTRAMUSCULAR | Status: AC
  Filled 2023-12-23: qty 2

## 2023-12-23 SURGICAL SUPPLY — 51 items
BLADE SURG 15 STRL LF DISP TIS (BLADE) ×4 IMPLANT
BNDG ELASTIC 2INX 5YD STR LF (GAUZE/BANDAGES/DRESSINGS) IMPLANT
BNDG ELASTIC 3INX 5YD STR LF (GAUZE/BANDAGES/DRESSINGS) ×2 IMPLANT
BNDG ESMARK 4X9 LF (GAUZE/BANDAGES/DRESSINGS) ×2 IMPLANT
BNDG GAUZE DERMACEA FLUFF 4 (GAUZE/BANDAGES/DRESSINGS) ×2 IMPLANT
BNDG PLASTER X FAST 3X3 WHT LF (CAST SUPPLIES) ×30 IMPLANT
CHLORAPREP W/TINT 26 (MISCELLANEOUS) ×2 IMPLANT
CORD BIPOLAR FORCEPS 12FT (ELECTRODE) ×2 IMPLANT
COUNTERSINK 2.0MM (BIT) ×1 IMPLANT
COVER BACK TABLE 60X90IN (DRAPES) ×2 IMPLANT
COVER MAYO STAND STRL (DRAPES) ×2 IMPLANT
CUFF TOURN SGL QUICK 18X4 (TOURNIQUET CUFF) ×2 IMPLANT
CUFF TRNQT CYL 24X4X16.5-23 (TOURNIQUET CUFF) IMPLANT
DRAPE EXTREMITY T 121X128X90 (DISPOSABLE) ×2 IMPLANT
DRAPE OEC MINIVIEW 54X84 (DRAPES) ×2 IMPLANT
DRAPE SURG 17X23 STRL (DRAPES) ×2 IMPLANT
DRILL 0.9 (DRILL) ×1 IMPLANT
DRIVER RETENTION T4 (ORTHOPEDIC DISPOSABLE SUPPLIES) ×1 IMPLANT
GAUZE SPONGE 4X4 12PLY STRL (GAUZE/BANDAGES/DRESSINGS) ×2 IMPLANT
GAUZE XEROFORM 1X8 LF (GAUZE/BANDAGES/DRESSINGS) ×2 IMPLANT
GLOVE BIO SURGEON STRL SZ7.5 (GLOVE) ×2 IMPLANT
GLOVE BIOGEL PI IND STRL 8 (GLOVE) ×2 IMPLANT
GLOVE BIOGEL PI IND STRL 8.5 (GLOVE) ×1 IMPLANT
GLOVE SURG ORTHO 8.0 STRL STRW (GLOVE) ×1 IMPLANT
GOWN STRL REUS W/ TWL LRG LVL3 (GOWN DISPOSABLE) ×2 IMPLANT
GOWN STRL REUS W/TWL XL LVL3 (GOWN DISPOSABLE) ×3 IMPLANT
KWIRE DBL .045X4 NSTRL (WIRE) ×2 IMPLANT
NDL HYPO 25X1 1.5 SAFETY (NEEDLE) IMPLANT
NEEDLE HYPO 25X1 1.5 SAFETY (NEEDLE) IMPLANT
NS IRRIG 1000ML POUR BTL (IV SOLUTION) ×2 IMPLANT
PACK BASIN DAY SURGERY FS (CUSTOM PROCEDURE TRAY) ×2 IMPLANT
PAD CAST 3X4 CTTN HI CHSV (CAST SUPPLIES) ×2 IMPLANT
PAD CAST 4YDX4 CTTN HI CHSV (CAST SUPPLIES) ×2 IMPLANT
SCREW NLOCK 1.3X7 (Screw) ×1 IMPLANT
SCREW NLOCK 1.3X8 (Screw) ×2 IMPLANT
SCREW NLOCK 1.3X9 (Screw) ×2 IMPLANT
SLEEVE SCD COMPRESS KNEE MED (STOCKING) ×1 IMPLANT
SLING ARM FOAM STRAP LRG (SOFTGOODS) ×1 IMPLANT
SPLINT PLASTER CAST XFAST 3X15 (CAST SUPPLIES) IMPLANT
SPLINT PLASTER CAST XFAST 4X15 (CAST SUPPLIES) IMPLANT
STOCKINETTE 4X48 STRL (DRAPES) ×2 IMPLANT
SUT CHROMIC 5 0 RB 1 27 (SUTURE) ×1 IMPLANT
SUT ETHILON 3 0 PS 1 (SUTURE) IMPLANT
SUT ETHILON 4 0 PS 2 18 (SUTURE) ×2 IMPLANT
SUT MERSILENE 4 0 P 3 (SUTURE) IMPLANT
SUT VIC AB 3-0 PS1 18XBRD (SUTURE) IMPLANT
SUT VIC AB 4-0 PS2 18 (SUTURE) ×1 IMPLANT
SYR BULB EAR ULCER 3OZ GRN STR (SYRINGE) ×2 IMPLANT
SYR CONTROL 10ML LL (SYRINGE) IMPLANT
TOWEL GREEN STERILE FF (TOWEL DISPOSABLE) ×4 IMPLANT
UNDERPAD 30X36 HEAVY ABSORB (UNDERPADS AND DIAPERS) ×2 IMPLANT

## 2023-12-23 NOTE — Anesthesia Postprocedure Evaluation (Addendum)
 Anesthesia Post Note  Patient: Michelle Bell  Procedure(s) Performed: CLOSED REDUCTION, FINGER, WITH PERCUTANEOUS PINNING (Right: Finger) OPEN REDUCTION INTERNAL FIXATION (ORIF) METACARPAL (Right: Finger)     Patient location during evaluation: PACU Anesthesia Type: General and Regional Level of consciousness: awake Pain management: pain level controlled Vital Signs Assessment: post-procedure vital signs reviewed and stable Respiratory status: spontaneous breathing, nonlabored ventilation and respiratory function stable Cardiovascular status: blood pressure returned to baseline and stable Postop Assessment: no apparent nausea or vomiting Anesthetic complications: no Comments: SpO2 was 88% on RA on finger with patient in stretcher. She was given an incentive spirometer. Patient moved to chair so she could sit upright. She denied any SOB. She was able to take a deep breath. Fingers were cold so SpO2 moved to ear and was 95-96% on RA.   No notable events documented.  Last Vitals:  Vitals:   12/23/23 1700 12/23/23 1715  BP: 130/87 (!) 122/92  Pulse: 88 90  Resp: (!) 23 (!) 23  Temp:    SpO2: 91% 91%    Last Pain:  Vitals:   12/23/23 1700  TempSrc:   PainSc: 0-No pain                 Conard Decent
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
## 2023-12-23 NOTE — Anesthesia Procedure Notes (Addendum)
 Anesthesia Regional Block: Supraclavicular block   Pre-Anesthetic Checklist: , timeout performed,  Correct Patient, Correct Site, Correct Laterality,  Correct Procedure, Correct Position, site marked,  Risks and benefits discussed,  Surgical consent,  Pre-op evaluation,  At surgeon's request and post-op pain management  Laterality: Right  Prep: chloraprep       Needles:  Injection technique: Single-shot  Needle Type: Echogenic Stimulator Needle     Needle Length: 9cm  Needle Gauge: 21     Additional Needles:   Procedures:,,,, ultrasound used (permanent image in chart),,    Narrative:  Start time: 12/23/2023 1:59 PM End time: 12/23/2023 2:03 PM Injection made incrementally with aspirations every 5 mL.  Performed by: Personally  Anesthesiologist: Conard Decent, MD  Additional Notes: Discussed risks and benefits of nerve block including, but not limited to, prolonged and/or permanent nerve injury involving sensory and/or motor function. Monitors were applied and a time-out was performed. The nerve and associated structures were visualized under ultrasound guidance. After negative aspiration, local anesthetic was slowly injected around the nerve. There was no evidence of high pressure during the procedure. There were no paresthesias. VSS remained stable and the patient tolerated the procedure well.
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
## 2023-12-23 NOTE — Anesthesia Procedure Notes (Signed)
 Procedure Name: LMA Insertion Date/Time: 12/23/2023 2:57 PM  Performed by: Lonia Ro, CRNAPre-anesthesia Checklist: Patient identified, Emergency Drugs available, Suction available, Patient being monitored and Timeout performed Patient Re-evaluated:Patient Re-evaluated prior to induction Oxygen Delivery Method: Circle system utilized Preoxygenation: Pre-oxygenation with 100% oxygen Induction Type: IV induction Ventilation: Mask ventilation without difficulty LMA: LMA inserted LMA Size: 4.0 Placement Confirmation: positive ETCO2 and breath sounds checked- equal and bilateral Tube secured with: Tape Dental Injury: Teeth and Oropharynx as per pre-operative assessment

## 2023-12-23 NOTE — Discharge Instructions (Addendum)
 **Next dose of Tylenol  due anytime after 7:45 [facility]  Wound Care: Keep your hand elevated above the level of your heart.  Do not allow it to dangle by your side.  Keep the dressing dry and do not remove it unless your doctor advises you to do so.  He will usually change it at the time of your post-op visit.  Moving your fingers is advised to stimulate circulation but will depend on the site of your surgery.  If you have a splint applied, your doctor will advise you regarding movement.  Activity: Do not drive or operate machinery today.  Rest today and then you may return to your normal activity and work as indicated by your physician.  Diet:  Drink liquids today or eat a light diet.  You may resume a regular diet tomorrow.    General expectations: Pain for two to three days. Fingers may become slightly swollen.  Call your doctor if any of the following occur: Severe pain not relieved by pain medication. Elevated temperature. Dressing soaked with blood. Inability to move fingers. White or bluish color to fingers.    Post Anesthesia Home Care Instructions  Activity: Get plenty of rest for the remainder of the day. A responsible individual must stay with you for 24 hours following the procedure.  For the next 24 hours, DO NOT: -Drive a car -Advertising copywriter -Drink alcoholic beverages -Take any medication unless instructed by your physician -Make any legal decisions or sign important papers.  Meals: Start with liquid foods such as gelatin or soup. Progress to regular foods as tolerated. Avoid greasy, spicy, heavy foods. If nausea and/or vomiting occur, drink only clear liquids until the nausea and/or vomiting subsides. Call your physician if vomiting continues.  Special Instructions/Symptoms: Your throat may feel dry or sore from the anesthesia or the breathing tube placed in your throat during surgery. If this causes discomfort,  gargle with warm salt water. The discomfort should disappear within 24 hours.  If you had a scopolamine patch placed behind your ear for the management of post- operative nausea and/or vomiting:  1. The medication in the patch is effective for 72 hours, after which it should be removed.  Wrap patch in a tissue and discard in the trash. Wash hands thoroughly with soap and water. 2. You may remove the patch earlier than 72 hours if you experience unpleasant side effects which may include dry mouth, dizziness or visual disturbances. 3. Avoid touching the patch. Wash your hands with soap and water after contact with the patch.      Regional Anesthesia Blocks  1. You may not be able to move or feel the "blocked" extremity after a regional anesthetic block. This may last may last from 3-48 hours after placement, but it will go away. The length of time depends on the medication injected and your individual response to the medication. As the nerves start to wake up, you may experience tingling as the movement and feeling returns to your extremity. If the numbness and inability to move your extremity has not gone away after 48 hours, please call your surgeon.   2. The extremity that is blocked will need to be protected until the numbness is gone and the strength has returned. Because you cannot feel it, you will need to take extra care to avoid injury. Because it may be weak, you may have difficulty moving it or using it. You may not know what position  it is in without looking at it while the block is in effect.  3. For blocks in the legs and feet, returning to weight bearing and walking needs to be done carefully. You will need to wait until the numbness is entirely gone and the strength has returned. You should be able to move your leg and foot normally before you try and bear weight or walk. You will need someone to be with you when you first try to ensure you do not fall and possibly risk injury.  4.  Bruising and tenderness at the needle site are common side effects and will resolve in a few days.  5. Persistent numbness or new problems with movement should be communicated to the surgeon or the [facility] [phone] [facility] [phone]).

## 2023-12-23 NOTE — Anesthesia Preprocedure Evaluation (Addendum)
 Anesthesia Evaluation  Patient identified by MRN, date of birth, ID band Patient awake    Reviewed: Allergy & Precautions, NPO status , Patient's Chart, lab work & pertinent test results  History of Anesthesia Complications Negative for: history of anesthetic complications  Airway Mallampati: III  TM Distance: >3 FB Neck ROM: Full    Dental  (+) Dental Advisory Given, Missing   Pulmonary neg pulmonary ROS   Pulmonary exam normal breath sounds clear to auscultation       Cardiovascular hypertension (amlodipine), Pt. on medications (-) angina (-) Past MI, (-) Cardiac Stents and (-) CABG (-) dysrhythmias  Rhythm:Regular Rate:Normal  HLD   Neuro/Psych  PSYCHIATRIC DISORDERS Anxiety Depression    negative neurological ROS     GI/Hepatic negative GI ROS, Neg liver ROS,,,  Endo/Other  negative endocrine ROS    Renal/GU negative Renal ROS     Musculoskeletal  (+) Arthritis , Osteoarthritis,    Abdominal  (+) + obese  Peds  Hematology negative hematology ROS (+) Lab Results      Component                Value               Date                      WBC                      10.0                12/14/2023                HGB                      12.7                12/14/2023                HCT                      39.8                12/14/2023                MCV                      90.2                12/14/2023                PLT                      423 (H)             12/14/2023              Anesthesia Other Findings   Reproductive/Obstetrics                             Anesthesia Physical Anesthesia Plan  ASA: 2  Anesthesia Plan: General   Post-op Pain Management: Regional block* and Tylenol  PO (pre-op)*   Induction: Intravenous  PONV Risk Score and Plan: 3 and Ondansetron , Dexamethasone  and Treatment may vary due to age or medical condition  Airway Management Planned:  LMA  Additional Equipment:   Intra-op Plan:   Post-operative Plan: Extubation in  OR  Informed Consent: I have reviewed the patients History and Physical, chart, labs and discussed the procedure including the risks, benefits and alternatives for the proposed anesthesia with the patient or authorized representative who has indicated his/her understanding and acceptance.     Dental advisory given  Plan Discussed with: CRNA and Anesthesiologist  Anesthesia Plan Comments: (Discussed potential risks of nerve blocks including, but not limited to, infection, bleeding, nerve damage, seizures, pneumothorax, respiratory depression, and potential failure of the block. Alternatives to nerve blocks discussed. All questions answered.  Risks of general anesthesia discussed including, but not limited to, sore throat, hoarse voice, chipped/damaged teeth, injury to vocal cords, nausea and vomiting, allergic reactions, lung infection, heart attack, stroke, and death. All questions answered. )        Anesthesia Quick Evaluation

## 2023-12-23 NOTE — Op Note (Signed)
 I assisted Surgeons and Role:    * Brunilda Capra, MD - Primary    Lyanne Sample, MD - Assisting on the Procedure(s): CLOSED REDUCTION, FINGER, WITH PERCUTANEOUS PINNING OPEN REDUCTION INTERNAL FIXATION (ORIF) METACARPAL on 12/23/2023.  I provided assistance on this case as follows: Set up, approach, protection of the distal radius fracture, identification of the fracture of the fifth metacarpal, debridement reduction and stabilization fixation of the fifth metacarpal fracture, closure of the wound. Manipulation and stabilization percutaneous fixation of the index metacarpal fracture, application of the dressing and splints.   Electronically signed by: Lyanne Sample, MD Date: 12/23/2023 Time: 4:23 PM

## 2023-12-23 NOTE — Addendum Note (Signed)
 Addendum  created 12/23/23 1747 by Conard Decent, MD   Clinical Note Signed

## 2023-12-23 NOTE — Transfer of Care (Signed)
 Immediate Anesthesia Transfer of Care Note  Patient: Michelle Bell  Procedure(s) Performed: CLOSED REDUCTION, FINGER, WITH PERCUTANEOUS PINNING (Right: Finger) OPEN REDUCTION INTERNAL FIXATION (ORIF) METACARPAL (Right: Finger)  Patient Location: PACU  Anesthesia Type:General  Level of Consciousness: awake and patient cooperative  Airway & Oxygen Therapy: Patient Spontanous Breathing and Patient connected to nasal cannula oxygen  Post-op Assessment: Report given to RN and Post -op Vital signs reviewed and stable  Post vital signs: Reviewed and stable  Last Vitals:  Vitals Value Taken Time  BP 128/96 12/23/23 1633  Temp    Pulse 94 12/23/23 1634  Resp 24 12/23/23 1634  SpO2 95 % 12/23/23 1634  Vitals shown include unfiled device data.  Last Pain:  Vitals:   12/23/23 1342  TempSrc: Temporal  PainSc: 6          Complications: No notable events documented.

## 2023-12-25 ENCOUNTER — Encounter (HOSPITAL_BASED_OUTPATIENT_CLINIC_OR_DEPARTMENT_OTHER): Payer: Self-pay | Admitting: Orthopedic Surgery

## 2023-12-30 DIAGNOSIS — S62330A Displaced fracture of neck of second metacarpal bone, right hand, initial encounter for closed fracture: Secondary | ICD-10-CM | POA: Diagnosis not present

## 2023-12-30 DIAGNOSIS — S52501A Unspecified fracture of the lower end of right radius, initial encounter for closed fracture: Secondary | ICD-10-CM | POA: Diagnosis not present

## 2023-12-30 DIAGNOSIS — S62326A Displaced fracture of shaft of fifth metacarpal bone, right hand, initial encounter for closed fracture: Secondary | ICD-10-CM | POA: Diagnosis not present

## 2024-01-06 DIAGNOSIS — S52501A Unspecified fracture of the lower end of right radius, initial encounter for closed fracture: Secondary | ICD-10-CM | POA: Diagnosis not present

## 2024-01-06 DIAGNOSIS — S62326A Displaced fracture of shaft of fifth metacarpal bone, right hand, initial encounter for closed fracture: Secondary | ICD-10-CM | POA: Diagnosis not present

## 2024-01-06 DIAGNOSIS — S62330A Displaced fracture of neck of second metacarpal bone, right hand, initial encounter for closed fracture: Secondary | ICD-10-CM | POA: Diagnosis not present

## 2024-01-21 DIAGNOSIS — S62326D Displaced fracture of shaft of fifth metacarpal bone, right hand, subsequent encounter for fracture with routine healing: Secondary | ICD-10-CM | POA: Diagnosis not present

## 2024-01-21 DIAGNOSIS — S52601D Unspecified fracture of lower end of right ulna, subsequent encounter for closed fracture with routine healing: Secondary | ICD-10-CM | POA: Diagnosis not present

## 2024-01-21 DIAGNOSIS — S62330D Displaced fracture of neck of second metacarpal bone, right hand, subsequent encounter for fracture with routine healing: Secondary | ICD-10-CM | POA: Diagnosis not present

## 2024-01-21 DIAGNOSIS — S52501D Unspecified fracture of the lower end of right radius, subsequent encounter for closed fracture with routine healing: Secondary | ICD-10-CM | POA: Diagnosis not present

## 2024-02-04 NOTE — Addendum Note (Signed)
 Addendum  created 02/04/24 1551 by Conard Decent, MD   Clinical Note Signed, Intraprocedure Blocks edited, SmartForm saved

## 2024-02-06 DIAGNOSIS — S62326D Displaced fracture of shaft of fifth metacarpal bone, right hand, subsequent encounter for fracture with routine healing: Secondary | ICD-10-CM | POA: Diagnosis not present

## 2024-02-06 DIAGNOSIS — S52601D Unspecified fracture of lower end of right ulna, subsequent encounter for closed fracture with routine healing: Secondary | ICD-10-CM | POA: Diagnosis not present

## 2024-02-06 DIAGNOSIS — S62330D Displaced fracture of neck of second metacarpal bone, right hand, subsequent encounter for fracture with routine healing: Secondary | ICD-10-CM | POA: Diagnosis not present

## 2024-02-06 DIAGNOSIS — S52501D Unspecified fracture of the lower end of right radius, subsequent encounter for closed fracture with routine healing: Secondary | ICD-10-CM | POA: Diagnosis not present

## 2024-02-09 DIAGNOSIS — D5 Iron 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): Secondary | ICD-10-CM | POA: Diagnosis not present

## 2024-02-09 DIAGNOSIS — Z6836 Body mass index (BMI) 36.0-36.9, adult: Secondary | ICD-10-CM | POA: Diagnosis not present

## 2024-02-09 DIAGNOSIS — R771 Abnormality of globulin: Secondary | ICD-10-CM | POA: Diagnosis not present

## 2024-02-09 DIAGNOSIS — E782 Mixed hyperlipidemia: Secondary | ICD-10-CM | POA: Diagnosis not present

## 2024-02-09 DIAGNOSIS — R202 Paresthesia of skin: Secondary | ICD-10-CM | POA: Diagnosis not present

## 2024-02-09 DIAGNOSIS — I10 Essential (primary) hypertension: Secondary | ICD-10-CM | POA: Diagnosis not present

## 2024-02-12 DIAGNOSIS — M79641 Pain in right hand: Secondary | ICD-10-CM | POA: Diagnosis not present

## 2024-02-12 DIAGNOSIS — R531 Weakness: Secondary | ICD-10-CM | POA: Diagnosis not present

## 2024-02-12 DIAGNOSIS — M25631 Stiffness of right wrist, not elsewhere classified: Secondary | ICD-10-CM | POA: Diagnosis not present

## 2024-02-12 DIAGNOSIS — M25641 Stiffness of right hand, not elsewhere classified: Secondary | ICD-10-CM | POA: Diagnosis not present

## 2024-02-12 DIAGNOSIS — M25531 Pain in right wrist: Secondary | ICD-10-CM | POA: Diagnosis not present

## 2024-02-25 DIAGNOSIS — F330 Major depressive disorder, recurrent, mild: Secondary | ICD-10-CM | POA: Diagnosis not present

## 2024-02-26 DIAGNOSIS — R531 Weakness: Secondary | ICD-10-CM | POA: Diagnosis not present

## 2024-02-26 DIAGNOSIS — M25631 Stiffness of right wrist, not elsewhere classified: Secondary | ICD-10-CM | POA: Diagnosis not present

## 2024-02-26 DIAGNOSIS — M25641 Stiffness of right hand, not elsewhere classified: Secondary | ICD-10-CM | POA: Diagnosis not present

## 2024-02-26 DIAGNOSIS — M25531 Pain in right wrist: Secondary | ICD-10-CM | POA: Diagnosis not present

## 2024-02-26 DIAGNOSIS — M79641 Pain in right hand: Secondary | ICD-10-CM | POA: Diagnosis not present

## 2024-03-02 DIAGNOSIS — F330 Major depressive disorder, recurrent, mild: Secondary | ICD-10-CM | POA: Diagnosis not present

## 2024-03-04 DIAGNOSIS — S52501D Unspecified fracture of the lower end of right radius, subsequent encounter for closed fracture with routine healing: Secondary | ICD-10-CM | POA: Diagnosis not present

## 2024-03-04 DIAGNOSIS — S52601D Unspecified fracture of lower end of right ulna, subsequent encounter for closed fracture with routine healing: Secondary | ICD-10-CM | POA: Diagnosis not present

## 2024-03-04 DIAGNOSIS — S62330D Displaced fracture of neck of second metacarpal bone, right hand, subsequent encounter for fracture with routine healing: Secondary | ICD-10-CM | POA: Diagnosis not present

## 2024-03-04 DIAGNOSIS — S62326D Displaced fracture of shaft of fifth metacarpal bone, right hand, subsequent encounter for fracture with routine healing: Secondary | ICD-10-CM | POA: Diagnosis not present

## 2024-03-11 DIAGNOSIS — M25631 Stiffness of right wrist, not elsewhere classified: Secondary | ICD-10-CM | POA: Diagnosis not present

## 2024-03-11 DIAGNOSIS — R531 Weakness: Secondary | ICD-10-CM | POA: Diagnosis not present

## 2024-03-11 DIAGNOSIS — M25531 Pain in right wrist: Secondary | ICD-10-CM | POA: Diagnosis not present

## 2024-03-11 DIAGNOSIS — M25641 Stiffness of right hand, not elsewhere classified: Secondary | ICD-10-CM | POA: Diagnosis not present

## 2024-03-11 DIAGNOSIS — M79641 Pain in right hand: Secondary | ICD-10-CM | POA: Diagnosis not present

## 2024-03-16 DIAGNOSIS — M25531 Pain in right wrist: Secondary | ICD-10-CM | POA: Diagnosis not present

## 2024-03-16 DIAGNOSIS — R531 Weakness: Secondary | ICD-10-CM | POA: Diagnosis not present

## 2024-03-16 DIAGNOSIS — M79641 Pain in right hand: Secondary | ICD-10-CM | POA: Diagnosis not present

## 2024-03-16 DIAGNOSIS — M25631 Stiffness of right wrist, not elsewhere classified: Secondary | ICD-10-CM | POA: Diagnosis not present

## 2024-03-16 DIAGNOSIS — M25641 Stiffness of right hand, not elsewhere classified: Secondary | ICD-10-CM | POA: Diagnosis not present

## 2024-03-25 DIAGNOSIS — M25641 Stiffness of right hand, not elsewhere classified: Secondary | ICD-10-CM | POA: Diagnosis not present

## 2024-03-25 DIAGNOSIS — M25531 Pain in right wrist: Secondary | ICD-10-CM | POA: Diagnosis not present

## 2024-03-25 DIAGNOSIS — M25631 Stiffness of right wrist, not elsewhere classified: Secondary | ICD-10-CM | POA: Diagnosis not present

## 2024-03-25 DIAGNOSIS — R531 Weakness: Secondary | ICD-10-CM | POA: Diagnosis not present

## 2024-03-25 DIAGNOSIS — M79641 Pain in right hand: Secondary | ICD-10-CM | POA: Diagnosis not present

## 2024-03-30 DIAGNOSIS — F330 Major depressive disorder, recurrent, mild: Secondary | ICD-10-CM | POA: Diagnosis not present

## 2024-04-05 DIAGNOSIS — S62326D Displaced fracture of shaft of fifth metacarpal bone, right hand, subsequent encounter for fracture with routine healing: Secondary | ICD-10-CM | POA: Diagnosis not present

## 2024-04-05 DIAGNOSIS — S62330D Displaced fracture of neck of second metacarpal bone, right hand, subsequent encounter for fracture with routine healing: Secondary | ICD-10-CM | POA: Diagnosis not present

## 2024-06-01 DIAGNOSIS — Z6836 Body mass index (BMI) 36.0-36.9, adult: Secondary | ICD-10-CM | POA: Diagnosis not present

## 2024-06-01 DIAGNOSIS — R202 Paresthesia of skin: Secondary | ICD-10-CM | POA: Diagnosis not present

## 2024-06-01 DIAGNOSIS — R0683 Snoring: Secondary | ICD-10-CM | POA: Diagnosis not present

## 2024-06-01 DIAGNOSIS — E782 Mixed hyperlipidemia: Secondary | ICD-10-CM | POA: Diagnosis not present

## 2024-06-01 DIAGNOSIS — D5 Iron 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): Secondary | ICD-10-CM | POA: Diagnosis not present

## 2024-06-01 DIAGNOSIS — I10 Essential (primary) hypertension: Secondary | ICD-10-CM | POA: Diagnosis not present

## 2024-06-15 ENCOUNTER — Other Ambulatory Visit: Payer: Self-pay | Admitting: Physician Assistant

## 2024-06-15 DIAGNOSIS — I10 Essential (primary) hypertension: Secondary | ICD-10-CM | POA: Diagnosis not present

## 2024-06-15 DIAGNOSIS — E782 Mixed hyperlipidemia: Secondary | ICD-10-CM | POA: Diagnosis not present

## 2024-06-15 DIAGNOSIS — N3001 Acute cystitis with hematuria: Secondary | ICD-10-CM | POA: Diagnosis not present

## 2024-06-15 DIAGNOSIS — Z6836 Body mass index (BMI) 36.0-36.9, adult: Secondary | ICD-10-CM | POA: Diagnosis not present

## 2024-06-15 DIAGNOSIS — R0683 Snoring: Secondary | ICD-10-CM | POA: Diagnosis not present

## 2024-06-15 DIAGNOSIS — Z1231 Encounter for screening mammogram for malignant neoplasm of breast: Secondary | ICD-10-CM

## 2024-06-15 DIAGNOSIS — D5 Iron 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): Secondary | ICD-10-CM | POA: Diagnosis not present

## 2024-06-15 DIAGNOSIS — R202 Paresthesia of skin: Secondary | ICD-10-CM | POA: Diagnosis not present

## 2024-06-30 ENCOUNTER — Ambulatory Visit
Admission: RE | Admit: 2024-06-30 | Discharge: 2024-06-30 | Disposition: A | Discharge status: Home / Self Care | Source: Ambulatory Visit | Attending: Physician Assistant | Admitting: Physician Assistant

## 2024-06-30 DIAGNOSIS — Z1231 Encounter for screening mammogram for malignant neoplasm of breast: Secondary | ICD-10-CM | POA: Diagnosis not present

## 2024-07-05 DIAGNOSIS — Z23 Encounter for immunization: Secondary | ICD-10-CM | POA: Diagnosis not present
